# Patient Record
Sex: Female | Born: 1970
Health system: Southern US, Community
[De-identification: ages and names within clinical notes are randomized; demographics above are authoritative.]

## PROBLEM LIST (undated history)

## (undated) DIAGNOSIS — K589 Irritable bowel syndrome without diarrhea: Secondary | ICD-10-CM

## (undated) DIAGNOSIS — E669 Obesity, unspecified: Secondary | ICD-10-CM

## (undated) DIAGNOSIS — E282 Polycystic ovarian syndrome: Secondary | ICD-10-CM

## (undated) DIAGNOSIS — N809 Endometriosis, unspecified: Secondary | ICD-10-CM

## (undated) DIAGNOSIS — K219 Gastro-esophageal reflux disease without esophagitis: Secondary | ICD-10-CM

## (undated) DIAGNOSIS — R51 Headache: Secondary | ICD-10-CM

## (undated) DIAGNOSIS — D509 Iron deficiency anemia, unspecified: Secondary | ICD-10-CM

## (undated) DIAGNOSIS — K5900 Constipation, unspecified: Secondary | ICD-10-CM

## (undated) DIAGNOSIS — R1310 Dysphagia, unspecified: Secondary | ICD-10-CM

## (undated) DIAGNOSIS — E538 Deficiency of other specified B group vitamins: Secondary | ICD-10-CM

## (undated) DIAGNOSIS — G96198 Other disorders of meninges, not elsewhere classified: Secondary | ICD-10-CM

## (undated) DIAGNOSIS — R519 Headache, unspecified: Secondary | ICD-10-CM

## (undated) DIAGNOSIS — F329 Major depressive disorder, single episode, unspecified: Secondary | ICD-10-CM

## (undated) DIAGNOSIS — G4700 Insomnia, unspecified: Secondary | ICD-10-CM

## (undated) DIAGNOSIS — F32A Depression, unspecified: Secondary | ICD-10-CM

## (undated) DIAGNOSIS — G4730 Sleep apnea, unspecified: Secondary | ICD-10-CM

## (undated) DIAGNOSIS — G9619 Other disorders of meninges, not elsewhere classified: Secondary | ICD-10-CM

## (undated) DIAGNOSIS — IMO0002 Reserved for concepts with insufficient information to code with codable children: Secondary | ICD-10-CM

## (undated) DIAGNOSIS — E119 Type 2 diabetes mellitus without complications: Secondary | ICD-10-CM

## (undated) DIAGNOSIS — G8929 Other chronic pain: Secondary | ICD-10-CM

## (undated) DIAGNOSIS — N979 Female infertility, unspecified: Secondary | ICD-10-CM

## (undated) DIAGNOSIS — E88819 Insulin resistance, unspecified: Secondary | ICD-10-CM

## (undated) DIAGNOSIS — R7303 Prediabetes: Secondary | ICD-10-CM

## (undated) DIAGNOSIS — F419 Anxiety disorder, unspecified: Secondary | ICD-10-CM

## (undated) DIAGNOSIS — T7840XA Allergy, unspecified, initial encounter: Secondary | ICD-10-CM

## (undated) DIAGNOSIS — R600 Localized edema: Secondary | ICD-10-CM

## (undated) DIAGNOSIS — M1990 Unspecified osteoarthritis, unspecified site: Secondary | ICD-10-CM

## (undated) DIAGNOSIS — D649 Anemia, unspecified: Secondary | ICD-10-CM

## (undated) HISTORY — PX: SPINAL FUSION: SHX223

## (undated) HISTORY — DX: Other chronic pain: G89.29

## (undated) HISTORY — DX: Irritable bowel syndrome, unspecified: K58.9

## (undated) HISTORY — DX: Female infertility, unspecified: N97.9

## (undated) HISTORY — DX: Gastro-esophageal reflux disease without esophagitis: K21.9

## (undated) HISTORY — DX: Allergy, unspecified, initial encounter: T78.40XA

## (undated) HISTORY — PX: CHOLECYSTECTOMY: SHX55

## (undated) HISTORY — DX: Insulin resistance, unspecified: E88.819

## (undated) HISTORY — DX: Unspecified osteoarthritis, unspecified site: M19.90

## (undated) HISTORY — DX: Major depressive disorder, single episode, unspecified: F32.9

## (undated) HISTORY — DX: Obesity, unspecified: E66.9

## (undated) HISTORY — DX: Prediabetes: R73.03

## (undated) HISTORY — DX: Type 2 diabetes mellitus without complications: E11.9

## (undated) HISTORY — DX: Headache, unspecified: R51.9

## (undated) HISTORY — DX: Insomnia, unspecified: G47.00

## (undated) HISTORY — DX: Other disorders of meninges, not elsewhere classified: G96.19

## (undated) HISTORY — DX: Dysphagia, unspecified: R13.10

## (undated) HISTORY — DX: Endometriosis, unspecified: N80.9

## (undated) HISTORY — DX: Other disorders of meninges, not elsewhere classified: G96.198

## (undated) HISTORY — DX: Deficiency of other specified B group vitamins: E53.8

## (undated) HISTORY — PX: COLONOSCOPY: SHX174

## (undated) HISTORY — PX: BRAIN SURGERY: SHX531

## (undated) HISTORY — DX: Anxiety disorder, unspecified: F41.9

## (undated) HISTORY — PX: ESOPHAGOGASTRODUODENOSCOPY: SHX1529

## (undated) HISTORY — DX: Sleep apnea, unspecified: G47.30

## (undated) HISTORY — DX: Iron deficiency anemia, unspecified: D50.9

## (undated) HISTORY — DX: Headache: R51

## (undated) HISTORY — DX: Localized edema: R60.0

## (undated) HISTORY — DX: Depression, unspecified: F32.A

## (undated) HISTORY — DX: Reserved for concepts with insufficient information to code with codable children: IMO0002

## (undated) HISTORY — DX: Polycystic ovarian syndrome: E28.2

---
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
## 1998-12-14 HISTORY — PX: ERCP: SHX60

## 1999-01-23 ENCOUNTER — Encounter: Payer: Self-pay | Admitting: Obstetrics and Gynecology

## 1999-01-23 ENCOUNTER — Ambulatory Visit (HOSPITAL_COMMUNITY): Admission: RE | Admit: 1999-01-23 | Discharge: 1999-01-23 | Payer: Self-pay | Admitting: Obstetrics and Gynecology

## 1999-04-23 ENCOUNTER — Emergency Department (HOSPITAL_COMMUNITY): Admission: EM | Admit: 1999-04-23 | Discharge: 1999-04-24 | Payer: Self-pay | Admitting: Emergency Medicine

## 1999-06-06 ENCOUNTER — Ambulatory Visit (HOSPITAL_COMMUNITY): Admission: RE | Admit: 1999-06-06 | Discharge: 1999-06-08 | Payer: Self-pay | Admitting: *Deleted

## 1999-06-06 ENCOUNTER — Encounter: Payer: Self-pay | Admitting: *Deleted

## 1999-06-07 ENCOUNTER — Encounter: Payer: Self-pay | Admitting: Internal Medicine

## 1999-09-24 ENCOUNTER — Other Ambulatory Visit: Admission: RE | Admit: 1999-09-24 | Discharge: 1999-09-24 | Payer: Self-pay | Admitting: Obstetrics and Gynecology

## 1999-12-15 HISTORY — PX: ABDOMINAL HYSTERECTOMY: SHX81

## 2000-06-21 ENCOUNTER — Ambulatory Visit (HOSPITAL_COMMUNITY): Admission: RE | Admit: 2000-06-21 | Discharge: 2000-06-21 | Payer: Self-pay | Admitting: Obstetrics and Gynecology

## 2000-06-21 ENCOUNTER — Encounter: Payer: Self-pay | Admitting: Obstetrics and Gynecology

## 2000-08-19 ENCOUNTER — Inpatient Hospital Stay (HOSPITAL_COMMUNITY): Admission: RE | Admit: 2000-08-19 | Discharge: 2000-08-20 | Payer: Self-pay | Admitting: Obstetrics and Gynecology

## 2000-08-19 ENCOUNTER — Encounter (INDEPENDENT_AMBULATORY_CARE_PROVIDER_SITE_OTHER): Payer: Self-pay | Admitting: Specialist

## 2001-12-15 ENCOUNTER — Encounter: Payer: Self-pay | Admitting: Obstetrics and Gynecology

## 2001-12-15 ENCOUNTER — Ambulatory Visit (HOSPITAL_COMMUNITY): Admission: RE | Admit: 2001-12-15 | Discharge: 2001-12-15 | Payer: Self-pay | Admitting: Obstetrics and Gynecology

## 2001-12-20 ENCOUNTER — Encounter: Payer: Self-pay | Admitting: General Surgery

## 2001-12-20 ENCOUNTER — Ambulatory Visit (HOSPITAL_COMMUNITY): Admission: RE | Admit: 2001-12-20 | Discharge: 2001-12-20 | Payer: Self-pay | Admitting: General Surgery

## 2004-08-08 ENCOUNTER — Encounter: Admission: RE | Admit: 2004-08-08 | Discharge: 2004-08-08 | Payer: Self-pay | Admitting: General Surgery

## 2004-08-11 ENCOUNTER — Ambulatory Visit (HOSPITAL_COMMUNITY): Admission: RE | Admit: 2004-08-11 | Discharge: 2004-08-11 | Payer: Self-pay | Admitting: General Surgery

## 2004-08-11 ENCOUNTER — Encounter: Admission: RE | Admit: 2004-08-11 | Discharge: 2004-08-11 | Payer: Self-pay | Admitting: General Surgery

## 2004-10-08 ENCOUNTER — Encounter: Admission: RE | Admit: 2004-10-08 | Discharge: 2004-11-27 | Payer: Self-pay | Admitting: General Surgery

## 2004-10-20 ENCOUNTER — Observation Stay (HOSPITAL_COMMUNITY): Admission: RE | Admit: 2004-10-20 | Discharge: 2004-10-21 | Payer: Self-pay | Admitting: General Surgery

## 2004-12-14 HISTORY — PX: LAPAROSCOPIC GASTRIC BANDING: SHX1100

## 2005-10-19 ENCOUNTER — Ambulatory Visit (HOSPITAL_COMMUNITY): Admission: RE | Admit: 2005-10-19 | Discharge: 2005-10-19 | Payer: Self-pay | Admitting: General Surgery

## 2007-07-22 ENCOUNTER — Ambulatory Visit (HOSPITAL_COMMUNITY): Admission: RE | Admit: 2007-07-22 | Discharge: 2007-07-22 | Payer: Self-pay | Admitting: Surgery

## 2007-08-25 ENCOUNTER — Ambulatory Visit: Payer: Self-pay | Admitting: Internal Medicine

## 2007-08-25 LAB — CONVERTED CEMR LAB
ALT: 28 units/L (ref 0–35)
AST: 24 units/L (ref 0–37)
Albumin: 3.8 g/dL (ref 3.5–5.2)
Alkaline Phosphatase: 117 units/L (ref 39–117)
BUN: 5 mg/dL — ABNORMAL LOW (ref 6–23)
Basophils Absolute: 0 10*3/uL (ref 0.0–0.1)
Basophils Relative: 0.1 % (ref 0.0–1.0)
Bilirubin, Direct: 0.1 mg/dL (ref 0.0–0.3)
CO2: 28 meq/L (ref 19–32)
Calcium: 9.2 mg/dL (ref 8.4–10.5)
Chloride: 105 meq/L (ref 96–112)
Creatinine, Ser: 1.1 mg/dL (ref 0.4–1.2)
Eosinophils Absolute: 0.1 10*3/uL (ref 0.0–0.6)
Eosinophils Relative: 1.9 % (ref 0.0–5.0)
GFR calc Af Amer: 71 mL/min
GFR calc non Af Amer: 59 mL/min
Glucose, Bld: 99 mg/dL (ref 70–99)
HCT: 39.8 % (ref 36.0–46.0)
Hemoglobin: 14.1 g/dL (ref 12.0–15.0)
Lymphocytes Relative: 36.7 % (ref 12.0–46.0)
MCHC: 35.4 g/dL (ref 30.0–36.0)
MCV: 87.8 fL (ref 78.0–100.0)
Monocytes Absolute: 0.7 10*3/uL (ref 0.2–0.7)
Monocytes Relative: 10.6 % (ref 3.0–11.0)
Neutro Abs: 3.2 10*3/uL (ref 1.4–7.7)
Neutrophils Relative %: 50.7 % (ref 43.0–77.0)
Platelets: 277 10*3/uL (ref 150–400)
Potassium: 3.9 meq/L (ref 3.5–5.1)
RBC: 4.53 M/uL (ref 3.87–5.11)
RDW: 12.6 % (ref 11.5–14.6)
Sodium: 139 meq/L (ref 135–145)
TSH: 1.61 microintl units/mL (ref 0.35–5.50)
Total Bilirubin: 0.5 mg/dL (ref 0.3–1.2)
Total Protein: 7 g/dL (ref 6.0–8.3)
WBC: 6.3 10*3/uL (ref 4.5–10.5)

## 2007-09-27 ENCOUNTER — Ambulatory Visit: Payer: Self-pay | Admitting: Internal Medicine

## 2008-02-10 DIAGNOSIS — E669 Obesity, unspecified: Secondary | ICD-10-CM | POA: Insufficient documentation

## 2008-02-10 DIAGNOSIS — F329 Major depressive disorder, single episode, unspecified: Secondary | ICD-10-CM | POA: Insufficient documentation

## 2008-02-10 DIAGNOSIS — K589 Irritable bowel syndrome without diarrhea: Secondary | ICD-10-CM | POA: Insufficient documentation

## 2008-02-10 DIAGNOSIS — K5909 Other constipation: Secondary | ICD-10-CM | POA: Insufficient documentation

## 2008-02-10 DIAGNOSIS — N809 Endometriosis, unspecified: Secondary | ICD-10-CM | POA: Insufficient documentation

## 2008-11-09 ENCOUNTER — Encounter: Payer: Self-pay | Admitting: Internal Medicine

## 2009-04-16 LAB — TSH: TSH: 1.8 u[IU]/mL (ref <=5.90)

## 2009-04-16 LAB — LIPID PANEL
Cholesterol: 137 mg/dL (ref 0–200)
HDL: 39 mg/dL (ref 35–70)
LDL Cholesterol: 74 mg/dL
Triglycerides: 121 mg/dL (ref 40–160)

## 2010-10-11 LAB — HEPATIC FUNCTION PANEL: Alkaline Phosphatase: 121 U/L (ref 25–125)

## 2010-10-11 LAB — TSH: TSH: 1.54 u[IU]/mL (ref <=5.90)

## 2011-01-03 ENCOUNTER — Encounter: Payer: Self-pay | Admitting: General Surgery

## 2011-01-13 NOTE — Procedures (Signed)
Summary: [facility]  [facility]   Imported By: Donneta Romberg 02/10/2008 16:46:01  _____________________________________________________________________  External Attachment:    Type:   Image     Comment:   External Document

## 2011-01-13 NOTE — Letter (Signed)
Summary: Restarted Lap-band/[facility]  Restarted Lap-band/[facility]   Imported By: Lester [place]insburg 12/22/2008 10:15:59  _____________________________________________________________________  External Attachment:    Type:   Image     Comment:   External Document

## 2011-01-14 LAB — HEMOGLOBIN A1C: Hgb A1c MFr Bld: 4.9 % (ref 4.0–6.0)

## 2011-02-04 ENCOUNTER — Other Ambulatory Visit: Payer: Self-pay | Admitting: Neurology

## 2011-02-04 DIAGNOSIS — Q054 Unspecified spina bifida with hydrocephalus: Secondary | ICD-10-CM

## 2011-02-04 DIAGNOSIS — G950 Syringomyelia and syringobulbia: Secondary | ICD-10-CM

## 2011-02-09 ENCOUNTER — Ambulatory Visit
Admission: RE | Admit: 2011-02-09 | Discharge: 2011-02-09 | Disposition: A | Discharge status: Home / Self Care | Payer: 59 | Source: Ambulatory Visit | Attending: Neurology | Admitting: Neurology

## 2011-02-09 DIAGNOSIS — G950 Syringomyelia and syringobulbia: Secondary | ICD-10-CM

## 2011-02-09 DIAGNOSIS — Q054 Unspecified spina bifida with hydrocephalus: Secondary | ICD-10-CM

## 2011-02-20 ENCOUNTER — Other Ambulatory Visit (HOSPITAL_COMMUNITY): Payer: Self-pay | Admitting: Neurosurgery

## 2011-02-20 DIAGNOSIS — G950 Syringomyelia and syringobulbia: Secondary | ICD-10-CM

## 2011-02-20 DIAGNOSIS — G930 Cerebral cysts: Secondary | ICD-10-CM

## 2011-02-20 DIAGNOSIS — M542 Cervicalgia: Secondary | ICD-10-CM

## 2011-02-24 ENCOUNTER — Other Ambulatory Visit (HOSPITAL_COMMUNITY): Payer: Self-pay | Admitting: Neurosurgery

## 2011-02-24 ENCOUNTER — Ambulatory Visit (HOSPITAL_COMMUNITY)
Admission: RE | Admit: 2011-02-24 | Discharge: 2011-02-24 | Disposition: A | Discharge status: Home / Self Care | Payer: 59 | Source: Ambulatory Visit | Attending: Neurosurgery | Admitting: Neurosurgery

## 2011-02-24 DIAGNOSIS — G950 Syringomyelia and syringobulbia: Secondary | ICD-10-CM

## 2011-02-24 DIAGNOSIS — M4802 Spinal stenosis, cervical region: Secondary | ICD-10-CM | POA: Insufficient documentation

## 2011-02-24 DIAGNOSIS — G935 Compression of brain: Secondary | ICD-10-CM | POA: Insufficient documentation

## 2011-02-24 DIAGNOSIS — M542 Cervicalgia: Secondary | ICD-10-CM

## 2011-02-24 DIAGNOSIS — G930 Cerebral cysts: Secondary | ICD-10-CM

## 2011-02-24 DIAGNOSIS — Q7649 Other congenital malformations of spine, not associated with scoliosis: Secondary | ICD-10-CM | POA: Insufficient documentation

## 2011-03-31 ENCOUNTER — Encounter (HOSPITAL_COMMUNITY)
Admission: RE | Admit: 2011-03-31 | Discharge: 2011-03-31 | Disposition: A | Discharge status: Home / Self Care | Payer: 59 | Source: Ambulatory Visit | Attending: Neurosurgery | Admitting: Neurosurgery

## 2011-03-31 DIAGNOSIS — Z01812 Encounter for preprocedural laboratory examination: Secondary | ICD-10-CM | POA: Insufficient documentation

## 2011-03-31 DIAGNOSIS — Z01818 Encounter for other preprocedural examination: Secondary | ICD-10-CM | POA: Insufficient documentation

## 2011-03-31 LAB — BASIC METABOLIC PANEL
BUN: 10 mg/dL (ref 6–23)
CO2: 29 mEq/L (ref 19–32)
Calcium: 9.2 mg/dL (ref 8.4–10.5)
Chloride: 103 mEq/L (ref 96–112)
Creatinine, Ser: 1.14 mg/dL (ref 0.4–1.2)
GFR calc Af Amer: >60 mL/min (ref >60)
GFR calc non Af Amer: 52 mL/min — ABNORMAL LOW (ref >60)
Glucose, Bld: 80 mg/dL (ref 70–99)
Potassium: 4.5 mEq/L (ref 3.5–5.1)
Sodium: 139 mEq/L (ref 135–145)

## 2011-03-31 LAB — CBC
HCT: 42.6 % (ref 36.0–46.0)
Hemoglobin: 14.3 g/dL (ref 12.0–15.0)
MCH: 29.6 pg (ref 26.0–34.0)
MCHC: 33.6 g/dL (ref 30.0–36.0)
MCV: 88.2 fL (ref 78.0–100.0)
Platelets: 277 10*3/uL (ref 150–400)
RBC: 4.83 MIL/uL (ref 3.87–5.11)
RDW: 13.2 % (ref 11.5–15.5)
WBC: 7.1 10*3/uL (ref 4.0–10.5)

## 2011-03-31 LAB — SURGICAL PCR SCREEN
MRSA, PCR: NEGATIVE
Staphylococcus aureus: NEGATIVE

## 2011-04-06 ENCOUNTER — Inpatient Hospital Stay (HOSPITAL_COMMUNITY): Payer: 59

## 2011-04-06 ENCOUNTER — Inpatient Hospital Stay (HOSPITAL_COMMUNITY)
Admission: RE | Admit: 2011-04-06 | Discharge: 2011-04-09 | DRG: 025 | Disposition: A | Discharge status: Home / Self Care | Payer: 59 | Source: Ambulatory Visit | Attending: Neurosurgery | Admitting: Neurosurgery

## 2011-04-06 DIAGNOSIS — Q761 Klippel-Feil syndrome: Secondary | ICD-10-CM

## 2011-04-06 DIAGNOSIS — E669 Obesity, unspecified: Secondary | ICD-10-CM | POA: Diagnosis present

## 2011-04-06 DIAGNOSIS — F3289 Other specified depressive episodes: Secondary | ICD-10-CM | POA: Diagnosis present

## 2011-04-06 DIAGNOSIS — Q7649 Other congenital malformations of spine, not associated with scoliosis: Secondary | ICD-10-CM

## 2011-04-06 DIAGNOSIS — G935 Compression of brain: Secondary | ICD-10-CM | POA: Diagnosis present

## 2011-04-06 DIAGNOSIS — M5030 Other cervical disc degeneration, unspecified cervical region: Secondary | ICD-10-CM | POA: Diagnosis present

## 2011-04-06 DIAGNOSIS — Z6841 Body Mass Index (BMI) 40.0 and over, adult: Secondary | ICD-10-CM

## 2011-04-06 DIAGNOSIS — M47812 Spondylosis without myelopathy or radiculopathy, cervical region: Secondary | ICD-10-CM | POA: Diagnosis present

## 2011-04-06 DIAGNOSIS — G950 Syringomyelia and syringobulbia: Principal | ICD-10-CM | POA: Diagnosis present

## 2011-04-06 DIAGNOSIS — F329 Major depressive disorder, single episode, unspecified: Secondary | ICD-10-CM | POA: Diagnosis present

## 2011-04-06 DIAGNOSIS — Z01812 Encounter for preprocedural laboratory examination: Secondary | ICD-10-CM

## 2011-04-06 DIAGNOSIS — Z79899 Other long term (current) drug therapy: Secondary | ICD-10-CM

## 2011-04-06 LAB — TYPE AND SCREEN
ABO/RH(D): O POS
Antibody Screen: NEGATIVE

## 2011-04-06 LAB — ABO/RH: ABO/RH(D): O POS

## 2011-04-08 NOTE — Op Note (Signed)
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  POSTOPERATIVE DIAGNOSES:  Chiari malformation, syringomyelia (holocord), congenital fusion of the C1 and occiput, congenital fusion of C2 and C3 consistent with a Klippel-Feil syndrome, advanced cervical spondylosis and degenerative disk disease at the C3-4 and C4-5 levels with resulting canal stenosis. PROCEDURES:  Chiari decompression including suboccipital craniectomy and upper cervical laminectomy including the diffused C2 and C3 spinous process and lamina as well as the spinous process lamina of C4 in the upper portion of the spinous process lamina of C5 with decompression of the craniocervical junction and cervical canal duraplasty with dura guard and C3-C5 posterior cervical arthrodesis with lateral massive screws ([place]) and locally harvested morselized autograft with the operating microscope and microsurgical microdissection technique.  SURGEON:  Erin Shorts, MD  ASSISTANT:  Erin Fake, MD  ANESTHESIA:  General endotracheal.  INDICATIONS:  The patient is a 40 year old woman who presented with variety of symptoms including right facial and upper extremity pain, numbness, history of headaches and altered vision.  Workup included initially MRI scan that revealed Chiari malformation with significant impaction of the  cerebellar tonsils into the upper cervical canal with syringomyelia extending from C2 down to the upper thoracic spinal cord. Subsequent MRI scan showed holocord syrinx.  CT scan was done of the craniocervical junction which revealed extensive congenital anomalies including congenital fusion of C1 and the occiput, congenital fusion of C2 and C3.  Decision was made to proceed with decompression of the Chiari malformation and decompression of the craniocervical junction at the upper cervical spinal canal to the level of C5 so as decompressed to C3-4, C4-5 levels as well.  Decision was made to proceed with posterolateral arthrodesis at those levels.  PROCEDURE:  The patient was brought to the operating room and placed under general endotracheal anesthesia.  The patient was placed on 3-pin [place], head holder was applied to the patient and the patient was turned to a prone position and then we checked positioning with the C- arm fluoroscope and then the occipital scalp was shaved and the occipital scalp and posterior neck and upper back were prepped with Betadine soap solution and draped in sterile fashion.  The midline was infiltrated with local anesthetic with epinephrine and then a midline incision made over the occipital and upper cervical region.  Dissection was carried down through the subcutaneous tissue.  Bipolar cautery and electrocautery was used to maintain hemostasis.  Dissection was carried down to the occiput as well as to the posterior spinous processes.  Self- retaining retractors were  placed and then we opened the cervical fascia and dissected the paracervical musculature from the spinous process and lamina.  The C-arm fluoroscope was again brought in to help [place] with our counts and we were able to identify the occiput, the fused posterior elements of C2 and C3 and the spinous process and lamina of C4 and C5. Self-retaining retractors were adjusted and the dissection was  carried out laterally exposing the facet complexes bilaterally at the C3-4 and C4-5.  At C3-4, there appeared to be partial fusion already present.  We then went ahead and placed lateral mass screws bilaterally at C3-C4 and C5 using C-arm fluoroscopic guidance and direct visualization.  This was done before we proceeded with a decompression.  Entry points were identified on the posterior aspects of the lateral masses C3-C4 and C5 bilaterally.  Pilot hole was started at each level and then the lateral mass was drilled to a depth of 12-14 mm.  I examined with the ball probe at each site.  Good bony surface were found.  The posterior cortical surface was then tapped and then we placed 3.5-mm screws bilaterally at each level using 12-mm screws bilaterally at C3 and C5 and 10-mm screws bilaterally at C4.  Once the screws were in place, the C-arm fluoroscope was removed and we proceeded with a decompression.  After the decompression and duraplasty was completed, we completed the posterior instrumentation construct and posterior fusion.  The decompression was begun with the suboccipital craniectomy using the high-speed drill and Kerrison punches.  The craniectomy was performed bilaterally and we opened the posterior ring of the foramen magnum fully.  We then proceeded with laminectomy of the upper cervical region, removing the fused spinous process C2 and C3 along with the lamina as well as the spinous process C4 and the lamina.  We subsequently removed the upper portion of the lamina of C5.  Once the bony decompression was completed, the operating microscope was draped and brought into the field.  We then carefully opened the dura inferiorly in the midline and carried the opening rostrally.  We identified the impacted portion of the cerebellar tonsils.  The dura opening at the craniocervical junction opened in a Y-shaped fashion extending superolaterally to the left and to the right.  The  arachnoid had several bands constricting the cerebellar tonsils was opened and the tonsils are actually retracted somewhat rostrally.  Good pulsations were noted and small bleeding points in the dural edges were coagulated with bipolar cautery.  We then cut a piece of dura guard to match the dural opening and a dural patch was made in the triangular fashion.  It was secured in place with interrupted and running 6-0 Prolene suture, first securing each of the 3 corners and then running in line of suture along each limb.  A good dural closure was achieved and tested against a Valsalva maneuver. Small piece of dura foam was placed over a small dural opening of the cerebellar hemisphere and dura seal was injected over the dural closure.  We then went ahead and cut rods to fit within the screw heads.  They were bent with a [place] bender and then placed within the screw heads and secured with locking nuts which were tightened against counter torque with a torque wrench.  Once all 6 locking nuts were in place.  We then packed locally harvested morselized autograft in the lateral gutter over the lateral masses bilaterally from C3-C5 taking care to ensure that none of the bone  graft fell into the open spinal canal.  Once the fusion construct was completed, we began to close paracervical musculature was approximated with interrupted undyed one Vicryl sutures. The deep fascia was closed with interrupted undyed #1 Vicryl sutures. Scarpa fascia was closed with interrupted undyed one Vicryl suture and the subcutaneous and subcuticular closed with interrupted inverted 2-0 Vicryl sutures.  Skin was closed with surgical staples and wound was dressed with Adaptic and sterile gauze.  The procedure was tolerated well.  The estimated blood loss was 100 mL.  Sponge and needle counts were correct.  Following surgery, the patient was returned back to the supine position.  The three pin Mayfield head holder was  removed.  The patient was reversed from the anesthetic, extubated and transferred to the recovery room.  Further care was noted to be removing all 4 extremities to command.     Erin Jimenez, M.D.     RWN/MEDQ  D:  04/06/2011  T:  04/07/2011  Job:  [id number]  Electronically Signed by Shirlean Kelly M.D. on 04/08/2011 12:51:30 PM

## 2011-04-11 NOTE — Discharge Summary (Signed)
  Erin Jimenez, Erin Jimenez                  ACCOUNT NO.:  [id number]  MEDICAL RECORD NO.:  [id number]           PATIENT TYPE:  I  LOCATION:  3105                         FACILITY:  [facility]  PHYSICIAN:  Hewitt Shorts, M.D.DATE OF BIRTH:  [birth year]  DATE OF ADMISSION:  04/06/2011 DATE OF DISCHARGE:  04/09/2011                              DISCHARGE SUMMARY   The patient is a 40 year old woman who presented with numbness in the right side of her face, extending into the neck and right shoulder, some alteration of vision.  She noted sneezing, coughing, and straining.  She developed sharp pain at the craniocervical junction.  Yesterday she underwent a workup that revealed significant Chiari malformation with significant impaction of the cerebellar tonsils into the upper cervical spinal canal as well as significant anomalies of the transcervical junction and upper cervical spine.  She had a hollow cord syringomyelia. She had assimilation of C1 into the occiput and a clipofile involving the C2 and C3 vertebra.  Significant stenosis of the cervical canal as well at the C3-4, C4-5 levels, and decision made to proceed with decompression of the Chiari malformation, upper cervical laminectomy, duraplasty, and posterior cervical arthrodesis from C3-C5, and the patient is admitted for such.  General examination showed a BMI of 44, but was otherwise unremarkable.  Neurologic examination showed intact cranial nerves other than diminished sensation on the right side of the face compared to the left side of the face.  Motor exam showed 5/5 strength.  Sensation was intact.  HOSPITAL COURSE:  The patient was admitted, underwent Chiari decompression including suboccipital craniectomy, upper cervical laminectomy, and duraplasty at C3-C5 and posterior cervical arthrodesis with lateral mass screws and bone graft.  She has done very nicely following surgery, fairly comfortable, we are able to the progress  her to clear liquids and then to a regular diet.  We will progress her from bed rest out of bed to chair to ambulation in the ICU.  She has been afebrile, her wound is healing nicely.  She is voiding well and symptomatically she is more comfortable than prior to surgery.  Or removing half of her staples prior to discharge, she and her family were given supplies for dressing changes daily for next few days.  She is given instructions on wound care and activity.  She is to be walking outside the fresh air, not to drive for at least the next 2 to 3 weeks, and not to lift anything heavy or strenuous.  She is to return in 4-5 days for removal of the remainder of her staples and she is to contact us if she needs anything else in the meantime.  Discharge prescription was given for Norco 5/225 one to two q.4-6 h. p.r.n. pain, 60 tablets, no refills.  DISCHARGE DIAGNOSES: 1. Chiari malformation. 2. Syringomyelia. 3. Clipofile syndrome. 4. Cervical spondylosis, degenerative disk disease, and stenosis.     Hewitt Shorts, M.D.     RWN/MEDQ  D:  04/09/2011  T:  04/10/2011  Job:  [id number]  Electronically Signed by Shirlean Kelly M.D. on 04/11/2011 08:07:01 AM

## 2011-04-11 NOTE — H&P (Signed)
NAME:  Erin Jimenez, Erin Jimenez                  ACCOUNT NO.:  [id number]  MEDICAL RECORD NO.:  [id number]           PATIENT TYPE:  I  LOCATION:  3105                         FACILITY:  [facility]  PHYSICIAN:  Hewitt Shorts, M.D.DATE OF BIRTH:  1971/03/15  DATE OF ADMISSION:  04/06/2011 DATE OF DISCHARGE:  04/09/2011                             HISTORY & PHYSICAL   HISTORY OF PRESENT ILLNESS:  The patient is a 40 year old right-handed white female who is evaluated for Chiari malformation.  She had noticed symptoms a few months ago, but now that she knows the diagnosis which she reflects back and notes that she has had some symptoms for many years.  Three to four months ago, she noted a knot at the base of her neck.  She was seen at an urgent care and treated with Mobic and Flexeril, subsequently developed numbness in the right side of her face, underwent neurology evaluation.  The numbness not only involved her right side of her face but also extended to the right of her neck towards the right shoulder, some pain extending from the right side of the neck down towards the right trapezius.  She also has noted some alteration of her vision, but denies diplopia, and this seems to her more like blurred vision.  She denies dysphagia, but says that sometimes the right side of her throat can have some numbness and she can have some imbalance at times.  She notes that for many years with sneezing, coughing, and straining she will develop sharp pain in the craniocervical junction posteriorly.  She gets some pain in the right frontal region.  She knows that for as long as she can remember with laughing she will get pain in the craniocervical junction along with some dizziness and she has to sit or lie down to left it subside and then to be able to go on.  The patient was evaluated with an MRI scan of the cervical spine and thoracic spine.  These revealed an anomaly of the craniocervical junction and the  C2-3 vertebra.  There was profound Chiari malformation with significant impaction of the cerebellar tonsils to the C3 level. There was syringomyelia extending from C2 into the thoracic spinal cord and underlying degenerative disk disease and spondylosis particularly at the C3-4 and C4-5 levels.  The syringomyelia extends down to lower thoracic spinal cord.  A CT scan of the craniocervical junction and cervical spine was also obtained.  This revealed assimilation of C1 into the occiput as well as C2-3 Klippel-Feil with advanced degenerative changes at C3-4 and C4-5 levels with canal stenosis, a large spur on the right at C3-4, and bilateral spurring at C4-5.  The patient was admitted now for decompression including suboccipital craniectomy, upper cervical laminectomy, duraplasty, and C3-C5 posterior cervical arthrodesis with lateral mass screws and bone graft.  PAST MEDICAL HISTORY:  She does not describe any history of hypertension, myocardial infarction, cancer, stroke, diabetes, peptic ulcer disease, or lung disease.  PREVIOUS SURGICAL HISTORY:  Cholecystectomy in 2000, hysterectomy in 2001, lap band in 2006 which failed and is no longer used.  She has  intolerance to a number of medications which cause nausea including Cipro, doxycycline, oxycodone, and sulfa, but no actual allergies to medications.  CURRENT MEDICATIONS:  Mobic, Flexeril, Wellbutrin, Estratest, Lorcet, Ambien, Claritin, and vitamin B12 injections.  FAMILY HISTORY:  Mother's age 68 with diabetes.  Father's age 86 with non-Hodgkin lymphoma.  Niece was diagnosed with Chiari malformation a year or two ago, but has not required surgical decompression and has presented slowly with headaches.  SOCIAL HISTORY:  The patient works as a Community education officer.  She is married.  She does not smoke.  She drinks alcohol beverages socially.  Denies history of substance abuse.  REVIEW OF SYSTEMS:  Notable  for those as described in the history of present illness and past medical history, but is otherwise unremarkable.  PHYSICAL EXAMINATION:  GENERAL:  The patient is a well-developed, well- nourished, but morbidly obese white female in no acute distress. VITAL SIGNS:  Temperature is 97.2, pulse 83, blood pressure 131/85, respiratory rate 20, height 5 feet and 10 inches, weight 285 pounds, BMI 44. LUNGS:  Clear to auscultation.  She has symmetric respiratory excursion. HEART:  Regular rate and rhythm.  Normal S1 and S2.  There is no murmur. ABDOMEN:  Soft, nontender, and nondistended.  Bowel sounds present. EXTREMITIES:  No clubbing, cyanosis, or edema. MUSCULOSKELETAL:  Some tenderness to palpation around the base of her neck.  She has good range of motion of the neck with flexion, extension, and lateral flexion to either side, but discomfort with extension of the neck and somewhat with lateral flexion to either side. NEUROLOGIC:  Mental status, the patient is awake, alert, and fully oriented.  Her speech is fluent.  She has good comprehension.  Cranial nerves show pupils are equal, round, and reactive to light.  Extraocular movements are intact.  Facial sensation is diminished on the right side as compared to left side.  Facial movement is symmetrical and hearing is present bilaterally.  Palatal movement is symmetrical.  Shoulder shrug is symmetrical.  Tongue is midline.  Motor examination shows 5/5 strength in the upper and lower extremities with the deltoid, biceps, triceps, extrinsic, grip, iliopsoas, quadriceps, dorsiflexor, extensor hallucis longus, and plantarflexor bilaterally.  Sensation is intact to pinprick through the digits of the upper and lower extremities. Reflexes on the left in biceps, brachioradialis, and triceps are 2; right biceps, brachioradialis, and triceps are 1+; quadriceps and gastrocnemius are 2 bilaterally.  Toes are downgoing bilaterally.  She has a normal  gait and stance, but mild to moderate unsteadiness with tandem gait.  IMPRESSION:  The patient with significant type 1 Chiari malformation with pronounced syringomyelia secondary to the Chiari malformation as well as anomalies of the craniocervical junction and of the upper cervical vertebra as described above with facial numbness extending down the right upper extremity, numbness in the right side of her throat, alternation of vision, imbalance, pain, and headache.  PLAN:  The patient will be admitted for suboccipital craniectomy, upper cervical laminectomy, duraplasty, decompression of cervical stenosis, and cervical stabilization with arthrodesis.  I reviewed the patient's condition, her imaging studies, and our plans for surgery with her at length.  We discussed typical length of surgery, hospital stay, overall recuperation, limitations postoperatively, and risks including the risk of infection, bleeding, possibly need for transfusion, risks of neurologic dysfunction including paralysis, coma, death, risk of failure for arthrodesis, possibility of the syringes will not decompress, and anesthesia risks, myocardial function, stroke, pneumonia, and death.  Understanding all of this,  she would like to go ahead with surgery and is admitted for such.     Hewitt Shorts, M.D.     RWN/MEDQ  D:  04/09/2011  T:  04/10/2011  Job:  [id number]  Electronically Signed by Shirlean Kelly M.D. on 04/11/2011 08:07:03 AM

## 2011-04-28 NOTE — Assessment & Plan Note (Signed)
[facility]                         GASTROENTEROLOGY OFFICE NOTE   Erin Jimenez, Erin Jimenez                         MRN:          [id number]  DATE:08/25/2007                            DOB:          1971-06-16    REFERRING PHYSICIAN:  self   CHIEF COMPLAINT:  Constipation.   PRIMARY CARE PHYSICIAN:  Reuben Likes, M.D.   SURGEON:  Sandria Bales. Ezzard Standing, M.D.   HISTORY:  This is a 40 year old white woman who has suffered from  chronic constipation.  The constipation has gotten much worse.  She  seems to think it got worse when she had laparoscopic banding for  obesity in 2005 and went on a high protein diet.  She actually had some  hemorrhoid problems then, I think.  It may have gotten a little bit  better over time but then in the past year or so, it has gotten  progressively worse to the point where she was actually having nausea  and vomiting when she was severely constipated.  She had an EGD  performed by Dr. Ezzard Standing on July 22, 2007, with minimal irritation below  the band on endoscopy but otherwise negative.  Subsequently, she has had  the saline taken out of her bands and that probably has not helped her  constipation.  She is not describing bleeding.  She has tried Clinical biochemist,  [employer] and that has not helped, taking up to 3-4 doses of MiraLax a  day and over a 2-week period.  She has had Dulcolax cause results with  good bowel movements but she has to take about 3 of them.  The nausea  and vomiting is much better.  She says she is lucky if she moves her  bowels twice a week at this point.   MEDICATIONS:  1. Bupropion 150 mg t.i.d.  2. Ambien 5 mg q.h.s.  3. EEMTDS 1.25/2.5 mg  daily (hormone supplement).  4. B12 monthly.  5. Claritin OTC daily.   PAST MEDICAL HISTORY:   ALLERGIES:  1. CIPRO.  2. OXYCODONE.  3. DOXYCYCLINE which all cause vomiting.   ILLNESS/SURGERY:  1. Depression.  2. Obesity.  3. Allergic rhinosinusitis.  4. Prior  laparoscopic-assisted vaginal hysterectomy and with right      salpingo-oophorectomy and cystoscopy and then subsequently she had      a left oophorectomy.  5. Prior cholecystectomy, 2000.  6. Prior ERCP with biliary sphincterotomy and common duct stone      extraction, 2000 (Dr. Marina Goodell).  7. Endometriosis.   FAMILY HISTORY:  Positive for diabetes in her mother.   SOCIAL HISTORY:  She is married.  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  Occasional alcohol.  No tobacco or drugs.  No  children.   REVIEW OF SYSTEMS:  As in my form, otherwise, negative.   PHYSICAL EXAMINATION:   PHYSICAL EXAMINATION:  VITAL SIGNS:  Height 5 feet 7 inches, weight 277  pounds, blood pressure 118/78, pulse 80.  GENERAL:  She is obese.  EYES:  Anicteric.  ENT:  Normal mouth and pharynx.  NECK:  Supple.  No mass.  CHEST:  Clear.  HEART:  S1 S2.  ABDOMEN:  She has laparoscopic scars but is otherwise benign without  organomegaly or mass.  RECTAL:  Normal.  No abnormal descent.  No mass.  No significant stool  in the vault.  There is no rectocele.  Normal tone.  Note, rectal exam  was performed in the presence of female medical staff.  LYMPHATIC:  No neck or inguinal adenopathy.  EXTREMITIES:  Free of edema in the lower extremities.  SKIN:  No acute rash.  PSYCH:  She is alert and oriented x3.   ASSESSMENT:  Chronic constipation, perhaps somewhat worse.  It sounds  like a transit problem more than anything.   PLAN:  1. CBC with diff, C-MET, TSH.  Question if she is hypothyroid.  2. Further plans pending that.  A colonoscopy could be indicated.  For      the time being we are going to try Amitiza 24 mcg b.i.d.  She was      warned about the possibility of nausea and to try to fight through      that for 2 weeks before she determines this to be a constant      problem.  Additionally, if she cannot move her bowels with that,      she will add MiraLax once or twice a      day.   Prior to starting all of this she is to purge there is      MiraLax and Dulcolax taking 4 Dulcolax and then 8 glasses of      MiraLax over a 2-3 hour period mixed with Gatorade.     Iva Boop, MD,FACG  Electronically Signed    CEG/MedQ  DD: 08/25/2007  DT: 08/25/2007  Job #: [id number]   cc:   Sandria Bales. Ezzard Standing, M.D.  Zenaida Niece, M.D.  Reuben Likes, M.D.

## 2011-04-28 NOTE — Assessment & Plan Note (Signed)
[facility]                         GASTROENTEROLOGY OFFICE NOTE   Erin Jimenez, Erin Jimenez                         MRN:          [id number]  DATE:09/27/2007                            DOB:          1971/06/27    CHIEF COMPLAINT:  Followup of constipation.   The Amitiza at 24 mcg is working beautifully with bowel movements every  other day.  There has been some occasional MiraLax usage.  She is having  some nausea the past few weeks.  She did not have that initially.  Maybe  1 or 2 weeks at most actually.  She had some phlegm in the back of her  throat, and she seems to be swallowing that and regurgitating that a  little bit.   MEDICATIONS:  Otherwise, listed and reviewed.   PAST MEDICAL HISTORY:  Reviewed and unchanged.  She wants to have her  lap band reinjected.  [name] is actually her bariatric surgeon.   Height 5 feet 7 inches, weight 281 pounds, pulse 72, blood pressure  118/62.   ASSESSMENT:  Constipation predominant irritable bowel syndrome.  I am  not sure what effect if any her lap banding is having.   PLAN:  1. Continue Amitiza 24 mcg b.i.d.  A 65-month prescription with 3      refills will be prescribed to the patient so that she can save      money through the mail order program.  2. I would ride out this nausea.  If she still has this phlegm in the      back of her throat (I do not think this is reflux at this time), I      have asked her to see her primary care physician.  We could add a      proton pump inhibitor.  I did not do that today.  It does not seem      to be the Amitiza given that it started well into the use of that,      though we will keep that in mind.  She will see me back as needed      at this time.   Note, CBC, CMET, TSH were all normal last month.     Iva Boop, MD,FACG  Electronically Signed    CEG/MedQ  DD: 09/27/2007  DT: 09/27/2007  Job #: [id number]   cc:   Reuben Likes, M.D.  Lorne Skeens.  Hoxworth, M.D.  Zenaida Niece, M.D.

## 2011-04-28 NOTE — Op Note (Signed)
NAME:  Erin Jimenez, Erin Jimenez                  ACCOUNT NO.:  [id number]   MEDICAL RECORD NO.:  [id number]          PATIENT TYPE:  AMB   LOCATION:  ENDO                         FACILITY:  [facility]   PHYSICIAN:  Sandria Bales. Ezzard Standing, M.D.  DATE OF BIRTH:  March 22, 1971   DATE OF PROCEDURE:  07/22/2007  DATE OF DISCHARGE:                               OPERATIVE REPORT   PREOPERATIVE DIAGNOSES:  Failed weight-loss, post-band, with recurrent  nausea.   POSTOPERATIVE DIAGNOSES:  Minimal irritation below band on endoscopy.  Otherwise normal endoscopy and normal band placement.   PROCEDURE:  Esophagogastroduodenoscopy.   SURGEON:  Sandria Bales. Ezzard Standing, M.D.   FIRST ASSISTANT:  None.   ANESTHESIA:  Fentanyl 50 micrograms, Versed 4 mg.   COMPLICATIONS:  None.   INDICATIONS FOR PROCEDURE:  Erin Jimenez is a 40 year old white female, who  underwent a laparoscopic band for morbid obesity on October 20, 2004, by  Erin Jimenez.  Her preoperative weight was 294 with a BMI of 47.3.  She lost down as low as 236, but had persistent vomiting, irritation, so  in February of 2008, Erin Jimenez removed all fluid from the band.  She  had an upper GI, which was actually about ten months ago, showing good  band placement.  She now comes for attempted upper endoscopy to document  band placement, evidence of any erosion or ulceration.   The indications and complications of the procedure were explained to the  patient, principle complications being perforation of the bowel.   PROCEDURE NOTE:  Patient had the back of her throat anesthetized with  Cetacaine.  She was rolled to her left side.  She was monitored with  pulse oximetry, blood pressure cuff and EKG.  She was given a total of  50 micrograms of fentanyl, 4 mg of Versed.  A Pentax upper endoscope was  passed without difficulty down the back of her throat, into her stomach  pouch.   I was able to cannulate the duodenum without difficulty.  She had no  evidence of any  duodenal irritation, scar or ulceration.  Her gastric  pouch, though somewhat bland appearing, was really unremarkable.  I did  do biopsies for CLO or for checking H. pylori.   I then retroflexed the scope and could see the band indention on the  proximal stomach from below.  It appeared to be all appropriate with no  evidence of ulceration or poor placement.  She did have a little bit of  irritation under one part surface of the band.  I did take photos of  this.   I went through the scope into the band.  The band orifice was between 39  and 40 cm.  The esophagogastric junction was at about 37 cm for an  approximately 3 cm pouch.  Her Z line, she really had no distal  esophageal irritation or erosion, and the scope was then withdrawn  without difficulty.   Other than some very mild irritation on the underneath surface of the  band, really the band looking in good placement, the pouch seemed to  be  in good size, and she had no evidence of chronic esophagitis.  Photos  were taken and placed in the chart.   She will be making an appointment with Erin Jimenez for followup  evaluation.      Sandria Bales. Ezzard Standing, M.D.  Electronically Signed     DHN/MEDQ  D:  07/22/2007  T:  07/22/2007  Job:  [id number]   cc:   Erin Jimenez, M.D.  [address]  [place]  [place] [address]   Erin Jimenez, M.D.  Fax: [phone]

## 2011-05-01 NOTE — Op Note (Signed)
NAME:  BRISEYDA, FEHR                  ACCOUNT NO.:  [id number]   MEDICAL RECORD NO.:  [id number]          PATIENT TYPE:  AMB   LOCATION:  DAY                          FACILITY:  [facility]   PHYSICIAN:  Sharlet Salina T. Hoxworth, M.D.DATE OF BIRTH:  1970-12-28   DATE OF PROCEDURE:  10/20/2004  DATE OF DISCHARGE:                                 OPERATIVE REPORT   PREOPERATIVE DIAGNOSIS:  Morbid obesity.  Adult onset diabetes mellitus.   POSTOPERATIVE DIAGNOSIS:  Morbid obesity.  Adult onset diabetes mellitus.   OPERATION PERFORMED:  Placement of laparoscopic adjustable gastric band.   SURGEON:  Lorne Skeens. Hoxworth, M.D.   ASSISTANT:  Thornton Park. Daphine Deutscher, MD   ANESTHESIA:  General.   INDICATIONS FOR PROCEDURE:  The patient is a 40 year old white female with  progressive morbid obesity unresponsive to medical management.  She has  developed comorbidities including hyperglycemia.  Elevated lipids and  chronic joint pain.  After extensive discussion of options and preoperative  work-up detailed elsewhere, she was brought to the operating room for  placement of laparoscopic adjustable gastric band.   DESCRIPTION OF PROCEDURE:  The patient was brought to the operating room and  placed in supine on the operating table and general endotracheal anesthesia  was induced.  She was given preoperative antibiotics and subcutaneous  heparin.  PAS were in place.  The abdomen was widely sterilely prepped and  draped.  Access was obtained with 11 mm OptiView trocar in the left  subcostal space.  Pneumoperitoneum was established.  There were a few  adhesions from her previous hysterectomy that were left undisturbed.  Under  direct vision, an 11 mm trocar was placed through the falciform ligament.  A  12 mm trocar in the right midabdomen and an 11 mm trocar in the left  midabdomen and a 5 mm trocar in the left flank.  The liver was retracted  with a Nathan's retractor placed through a 5 mm site in the  epigastrium.  Initially the angle of His was exposed and the peritoneum overlying the left  crus was incised leaving a lateral attachments to the fundus.  Blunt  dissection was carried directly down onto the left crus.  It was dissected  well posteriorly.  Following this, an avascular areas of the pars flaccida  was divided, the caudate lobe of the liver retracted to the right and the  base of the right crus identified.  Beginning at the medial edge, blunt  dissection was carried into the retroperitoneum with the finger retractor  and then it was passed without any force posterior to the upper stomach and  brought out through the previous dissected angle of His on top of the left  crus without any difficulty.  A 10 cm lap band system was then introduced  into the abdomen and the tubing placed through the finger retractor which  was withdrawn and the tubing and lap band brought around posterior to the  stomach.  The calibration of the tube was passed orally into the stomach and  then the tubing was inserted through the buckle and  the band snapped into  place without any tightness or difficulty and was seen to be in excellent  position.  The calibration tubing was removed.  The band was then imbricated  with 2-0 Vicryl interrupted sutures suturing the fundus up over the band to  the pouch at three sites.  Following this, the endo tubing was brought out  through the 12 mm trocar site of the right midabdomen and trocars were  removed and all CO2 evacuated.  The incision was extended about 1 cm  laterally and the anterior fascia cleared laterally.  The port was attached  to the tubing and then four interrupted 0 Prolene sutures were placed in  four quadrants on the anterior fascia, passed through the holes of the port  was then passed down to the fascia and the suture secured.  This left the  port site about  1 cm directly inferior to the lateral corner of the incision.  Following  this, skin  incisions were closed with interrupted 4-0 Monocryl and Steri-  Strips.  Sponge and instrument counts were correct.  Dry sterile dressings  were applied.  The patient was taken to the recovery room in good condition.      BTH/MEDQ  D:  10/20/2004  T:  10/20/2004  Job:  [id number]

## 2011-05-01 NOTE — Discharge Summary (Signed)
[facility]  Patient:    Erin Jimenez, Erin Jimenez                       MRN: [id number] Adm. Date:  [id number] Disc. Date: [id number] Attending:  Michaele Offer                           Discharge Summary  ADMISSION DIAGNOSES: 1. Pelvic pain. 2. History of endometriosis.  DISCHARGE DIAGNOSES: 1. Pelvic pain. 2. Endometriosis. 3. Leiomyomatous uterus.  PROCEDURES: 1. Laparoscopically-assisted vaginal hysterectomy. 2. Right salpingo-oophorectomy. 3. Cystoscopy.  COMPLICATIONS:  None.  CONSULTATIONS:  None.  HISTORY OF PRESENT ILLNESS:  Briefly, this is a 40 year old white female, para 0, who has had multiple prior surgeries for endometriosis and several evaluations for infertility.  She has recently noted increased pelvic pain and after consultation with Dr. Elesa Hacker at [facility] has decided that her chances of getting pregnant are minimal, and she wishes to proceed with definitive surgical therapy.  PAST MEDICAL HISTORY:  Significant for B12 deficiency.  PAST SURGICAL HISTORY:  Significant for laparoscopic cholecystectomy 1993, diagnostic laparoscopy for endometriosis 1993, laparotomy for endometriosis and uterine suspension 1994, laparoscopic lysis of adhesions and fulguration of endometriosis 1995, laparoscopic salpingo-oophorectomy for endometriosis 1998, laparoscopic lysis of adhesions again for endometriosis.  ALLERGIES:  DOXYCYCLINE, OXYCODONE, SULFA, and CIPRO.  ADMISSION PHYSICAL EXAMINATION:  VITAL SIGNS:  The patients weight is 250 pounds.  ABDOMEN:  Soft, nondistended without palpable masses.  Slightly tender in the right lower quadrant without rebound or guarding.  Multiple scars from previous laparoscopies and a laparotomy.  GENITOURINARY:  Pelvic exam reveals a tender right uterosacral ligament and a tender right adnexa with a small anteverted nontender uterus and normal left adnexa.  HOSPITAL COURSE:  The patient was  admitted on the day of surgery and underwent the above-mentioned procedures without complications.  This was performed under general anesthesia with an estimated blood loss of 100 cc.  Her right tube and ovary were adherent to each other, and she did have evidence of previous LSO and previous uterine suspension.  She tolerated the procedure well.  On the morning of postoperative day #1 she was able to tolerate liquids, and her incisions were healing well.  Preop hemoglobin was 12.6, postop was 1.0.  During that day she was able to ambulate and tolerate a regular diet and felt to be stable enough for discharge home.  Final pathology reveals normal right fallopian tube and ovary, benign cervix, uterus with three intramural leiomyomata and uterine cervix with a focus of endometriosis.  CONDITION ON DISCHARGE:  Stable.  DISPOSITION:  Discharged to home.  DISCHARGE INSTRUCTIONS:  Her diet is regular diet.  Her activity is pelvic rest.  No strenuous activity, no driving.  FOLLOWUP:  Her follow-up is in two weeks for an incision check.  DISCHARGE MEDICATIONS: 1. Percocet p.r.n. pain. 2. Premarin 0.9 mg p.o. q.d. DD:  09/08/00 TD:  09/08/00 Job: [id number]

## 2011-05-01 NOTE — H&P (Signed)
[facility]  Patient:    Erin Jimenez, Erin Jimenez                           MRN: [id number] Adm. Date:  08/19/00 Attending:  Zenaida Niece, M.D.                         History and Physical  CHIEF COMPLAINT:  Pelvic pain and history of endometriosis.  HISTORY OF PRESENT ILLNESS:  This is a 40 year old white female, para 0, who has had multiple prior surgeries for endometriosis.  She has undergone multiple courses of Clomid, also after being treated with Metformin to achieve ovulation induction without much success.  She was then referred to Dr. Morrison Old in [place] for infertility.  Since June of this year, she began having right lower quadrant pain which has progressively gotten worse and is achy and sharp in nature.  She has required narcotic medications to control this pain.  She has normal bowel movements and urination.  She was initially treated with Tylenol No. 3 and when her painpersisted, a pelvic ultrasound was performed which revealed a small ovarian cyst but an otherwise normal pelvis.  She states that this pain feelslike endometriosis that she has had in the past.  On exam at that time, she had a tender right uterosacral ligament and right adnexal area without palpable masses.  At that time, she wished to undergo conservative surgery with possible definitive surgery. Since then, she has consulted withDr. Elesa Hacker and agreed that her changes of getting pregnant are very slimand she now wishes to proceed with definitive surgical therapy with hysterectomy.  PAST MEDICAL HISTORY:  B12 deficiency.  PAST SURGICAL HISTORY:  Significant for laparoscopic cholecystectomy in 2000 without complications.  In 1993 she had a diagnostic laparoscopy for endometriosis.  In 1993, she had a laparotomy for endometriosis and uterine suspension.  In 1994, she had a laparoscopic lysis of adhesions and fulgurationof endometriosis.  In 1995, she had a  laparoscopic salpingo-oophorectomy, also for endometriosis, and in 1998, she had a laparoscopic lysis of adhesions, again for endometriosis.  ALLERGIES:  DOXYCYCLINE, OXYCODONE, SULFA, CIPRO.  CURRENT MEDICATIONS:  Tylenol No. 3 and B12 injections.  SOCIAL HISTORY:  Patient is married and denies alcohol, tobacco or drug use.  FAMILY HISTORY:  No GYN or colon cancer.  PHYSICAL EXAMINATION  GENERAL:  Weight is approximately 250 pounds.  Generally, she is a slightly obese white female who is in mild distress from her pelvic discomfort.  HEENT:  Pupils are equally round and reactive to light and accommodation and her extraocular muscles are intact.  Oropharynx is clear without erythema or exudates.  NECK:  Supple, without lymphadenopathy or thyromegaly.  LUNGS:  Clear to auscultation.  HEART:  Regular rate and rhythm, without murmurs.  ABDOMEN:  Soft, nondistended, without palpable masses.  She is slightly tender in the right lower quadrant, without rebound or guarding.  She has multiple scars from previous laparoscopies and a laparotomy.  EXTREMITIES:  No edema and are nontender.  DTRs are 2/4 and symmetric.  PELVIC:  External genitalia is without lesions.  On speculum exam, she also has no lesions and Pap smear performed October of 2000 was within normal limits.  On bimanual exam, she has a tender right uterosacral ligament and a tender right adnexa.  She has a small anteverted, nontender uterus and normal leftadnexa.  ASSESSMENT:  Recurrent pelvic pain, probably due  to endometriosis.  Patient has failed medical therapy and has had multiple prior conservative surgeries for endometriosis.  Due to her significant discomfort and previous surgeries, she now wishes definitive surgical therapy.  Risks of surgery have been discussed with the patient including permanent inability to have children and risks of bleeding, infection and damage tosurrounding organs.  The need  for estrogen-replacement therapy has also been discussed with the patient and she agrees to proceed.  PLAN:  Plan is to admit the patient for a laparoscopically assisted vaginal hysterectomy with right salpingo-oophorectomy, possible laparotomy and cystoscopy. DD:  08/18/00 TD:  08/18/00 Job: [id number]

## 2011-05-01 NOTE — Op Note (Signed)
[facility]  Patient:    Erin Jimenez, Erin Jimenez                       MRN: [id number] Proc. Date: 08/19/00 Adm. Date:  [id number] Disc. Date: [id number] Attending:  Michaele Offer                           Operative Report  PREOPERATIVE DIAGNOSES: 1. Pelvic pain. 2. Endometriosis.  POSTOPERATIVE DIAGNOSES: 1. Pelvic pain. 2. Endometriosis.  PROCEDURE:  Laparoscopic assisted vaginal hysterectomy with right salpingo-oophorectomy and cystoscopy.  SURGEON:  Zenaida Niece, M.D.  ASSISTANT:  Huel Cote, M.D.  ANESTHESIA:  General endotracheal tube.  ESTIMATED BLOOD LOSS:  100 cc.  CHEMOPROPHYLAXIS:  Ancef 1 gram.  FINDINGS:  Right tube and ovary were adherent to each other, and there was evidence of a previous left salpingo-oophorectomy and previous uterine suspension via the round ligaments.  She also had several spots of endometriosis and some filmy adhesions in the posterior cul-de-sac.  Counts correct.  Condition stable.  DESCRIPTION OF PROCEDURE:  After appropriate informed consent was obtained, the patient was taken to the operating room and placed in the dorsal spine position.  General anesthesia was induced, and she was placed in mobile stirrups.  Her abdomen, perineum, and vagina were prepped and draped in the usual sterile fashion for a laparoscopic and vaginal procedure.  Her bladder was drained with a red rubber catheter, and a Hulka tenaculum applied to her cervix for uterine manipulation.  Her infraumbilical skin was then infiltrates with 0.25% Marcaine, and a 3 cm horizontal incision was made.  The fascia was identified and entered sharply, and the peritoneum was identified and entered bluntly.  A purse-string suture of 0 Vicryl was placed around the fascia, and the Hasson cannula was introduced and the suture attached to it.  The abdomen was insufflated with CO2 gas, and placement of the cannula confirmed with  the laparoscope.  Two 5 mm ports were placed in the abdomen, one on the left and one on the right under direct visualization with the laparoscope.  Inspection then revealed the above-mentioned findings.  The right infundibulopelvic ligament, mesosalpinx, mesovarium, and right round ligament were taken down with the tripolar device to free-up the right tube and ovary.  This was done with some difficulty in identifying the ureter, but we did feel we were well above it.  The superior portion of the broad ligament on the right was also taken down with the tripolar device.  On the left side, the round ligament and upper broad ligament were taken down with the tripolar device, and the anterior peritoneum across the anterior portion of the uterus was taken down with the tripolar device and the bladder pushed inferiorly.  This achieved good mobilization, and all sites were found to be hemostatic.  The laparoscopic portion of the procedure was then completed.  The patients legs were then elevated in the stirrups, as we turned to the vaginal procedure.  A weighted speculum was inserted into the vagina and the cervix grasped with Christella Hartigan tenaculums.  The cervicovaginal mucosa was infiltrated with a dilute solution of Pitressin, and the cervicovaginal mucosa was then incised with electrocautery.  The posterior cul-de-sac was grasped with a Kocher clamp and entered sharply with scissors.  The Pannu speculum was inserted into the peritoneal cavity.  The bladder was sharply and bluntly pushed off anteriorly, but the anterior peritoneum was  not entered yet.  The ureterosacral ligaments were clamped, trisected, and ligated on each side with #1 chromic and tagged for later use.  Uterine arteries and lower broad ligament were clamped, transected, and ligated with #1 chromic.  At this time, the anterior peritoneum was entered, and the Deaver retractor was used to retract the bladder anteriorly.  The middle  portions of the broad ligament were clamped, transected, and ligated with #1 chromic, and the uterus was removed.  This was done with some difficulty, as the cervix was pretty long, and the uterus was fairly well supported.  Bleeding from the vaginal cuff was controlled with electrocautery.  I attempted to run the peritoneum to the vagina to help maintain adequate hemostasis, but the peritoneum could not adequately be identified.  Thus after electrocautery, the vagina was closed in a running fashion vertically with running, locking 0 Vicryl.  The previously tagged ureterosacral ligaments were tied together in the midline.  Irrigation in the vagina revealed hemostasis.  The patient was given indigo carmine IV, and a Foley catheter was placed.  There were 200 cc of water instilled into the bladder, and the Foley catheter was removed.  Cystoscopy was then performed with the 70 degree cystoscope and confirmed bilateral ureteral patency and no sutures in the bladder.  The cystoscope was removed, and the Foley catheter reinserted and hooked to drainage.  The patients legs were then lowered, and Dr. Senaida Ores and I and the scrub techs all changed gloves. The laparoscope was reinserted and the abdomen reinsufflated with CO2 gas. The pelvis was copiously irrigated, and some small bleeding sites controlled with bipolar cautery.  At the end of the procedure, the pelvis was hemostatic. The 5 mm ports were removed under direct visualization.  The laparoscope was removed and all gas allowed to deflate from the abdomen.  The Hasson cannula was removed, and the previously placed purse-string suture was tied together with adequate closure of the infraumbilical fascia.  The infraumbilical skin was closed with a running subcuticular suture of 4-0 Vicryl followed by Steri-Strips and a band-aid.  The 5 mm port incisions were closed with interrupted subcuticular sutures of 4-0 Vicryl followed by Steri-Strips  and band-aids.  The patient tolerated the procedure well, was extubated, and taken to the recovery room in stable condition. DD:  08/19/00 TD:  08/20/00 Job: [id number]

## 2011-06-08 ENCOUNTER — Ambulatory Visit
Admission: RE | Admit: 2011-06-08 | Discharge: 2011-06-08 | Disposition: A | Discharge status: Home / Self Care | Payer: 59 | Source: Ambulatory Visit | Attending: Neurosurgery | Admitting: Neurosurgery

## 2011-06-08 ENCOUNTER — Other Ambulatory Visit: Payer: Self-pay | Admitting: Neurosurgery

## 2011-06-08 DIAGNOSIS — G96198 Other disorders of meninges, not elsewhere classified: Secondary | ICD-10-CM

## 2011-06-09 ENCOUNTER — Encounter (HOSPITAL_COMMUNITY)
Admission: RE | Admit: 2011-06-09 | Discharge: 2011-06-09 | Disposition: A | Discharge status: Home / Self Care | Payer: 59 | Source: Ambulatory Visit | Attending: Neurosurgery | Admitting: Neurosurgery

## 2011-06-09 LAB — PROTIME-INR
INR: 0.97 (ref 0.00–1.49)
Prothrombin Time: 13.1 s (ref 11.6–15.2)

## 2011-06-09 LAB — CBC
HCT: 39.2 % (ref 36.0–46.0)
Hemoglobin: 13.3 g/dL (ref 12.0–15.0)
MCH: 29.4 pg (ref 26.0–34.0)
MCHC: 33.9 g/dL (ref 30.0–36.0)
MCV: 86.5 fL (ref 78.0–100.0)
Platelets: 295 K/uL (ref 150–400)
RBC: 4.53 MIL/uL (ref 3.87–5.11)
RDW: 13.4 % (ref 11.5–15.5)
WBC: 5.9 K/uL (ref 4.0–10.5)

## 2011-06-09 LAB — BASIC METABOLIC PANEL WITH GFR
BUN: 8 mg/dL (ref 6–23)
CO2: 28 meq/L (ref 19–32)
Calcium: 9.2 mg/dL (ref 8.4–10.5)
Chloride: 103 meq/L (ref 96–112)
Creatinine, Ser: 1.12 mg/dL — ABNORMAL HIGH (ref 0.50–1.10)
GFR calc Af Amer: >60 mL/min
GFR calc non Af Amer: 53 mL/min — ABNORMAL LOW
Glucose, Bld: 85 mg/dL (ref 70–99)
Potassium: 4.1 meq/L (ref 3.5–5.1)
Sodium: 138 meq/L (ref 135–145)

## 2011-06-09 LAB — APTT: aPTT: 29 s (ref 24–37)

## 2011-06-09 LAB — SURGICAL PCR SCREEN
MRSA, PCR: NEGATIVE
Staphylococcus aureus: NEGATIVE

## 2011-06-11 ENCOUNTER — Inpatient Hospital Stay (HOSPITAL_COMMUNITY)
Admission: RE | Admit: 2011-06-11 | Discharge: 2011-06-13 | DRG: 030 | Disposition: A | Discharge status: Home / Self Care | Payer: 59 | Source: Ambulatory Visit | Attending: Neurosurgery | Admitting: Neurosurgery

## 2011-06-11 DIAGNOSIS — G969 Disorder of central nervous system, unspecified: Principal | ICD-10-CM | POA: Diagnosis present

## 2011-06-11 DIAGNOSIS — G96198 Other disorders of meninges, not elsewhere classified: Secondary | ICD-10-CM | POA: Diagnosis present

## 2011-06-11 DIAGNOSIS — Z01812 Encounter for preprocedural laboratory examination: Secondary | ICD-10-CM

## 2011-06-11 DIAGNOSIS — Y838 Other surgical procedures as the cause of abnormal reaction of the patient, or of later complication, without mention of misadventure at the time of the procedure: Secondary | ICD-10-CM | POA: Diagnosis present

## 2011-06-11 DIAGNOSIS — K219 Gastro-esophageal reflux disease without esophagitis: Secondary | ICD-10-CM | POA: Diagnosis present

## 2011-06-11 LAB — TYPE AND SCREEN
ABO/RH(D): O POS
Antibody Screen: NEGATIVE

## 2011-06-18 NOTE — Discharge Summary (Signed)
  Erin Jimenez, Erin Jimenez                  ACCOUNT NO.:  [id number]  MEDICAL RECORD NO.:  [id number]  LOCATION:  3007                         FACILITY:  [facility]  PHYSICIAN:  Hilda Lias, M.D.   DATE OF BIRTH:  December 08, 1971  DATE OF ADMISSION:  06/11/2011 DATE OF DISCHARGE:  06/13/2011                              DISCHARGE SUMMARY   ADMISSION DIAGNOSIS:  Occipital cervical pseudomeningocele.  FINAL DIAGNOSIS:  Occipital cervical pseudomeningocele.  CLINICAL HISTORY:  The patient was admitted because of pseudomeningocele.  The patient previously had a Chiari malformation. She developed a pseudomeningocele.  Laboratory normal.  COURSE IN THE HOSPITAL:  She was taken to surgery on June 11, 2011, and the repair of the pseudomeningocele was done by Dr. Shirlean Kelly. Today, she is doing great and she is ready to go home.  CONDITION ON DISCHARGE:  Improvement.  MEDICATION:  Hydrocodone for pain.  DIET:  Regular.  ACTIVITY:  Not to drive.  Not to do any heavy lifting.  FOLLOWUP:  She will be seen by Dr. Newell Coral in 10 days.          ______________________________ Hilda Lias, M.D.     EB/MEDQ  D:  06/13/2011  T:  06/13/2011  Job:  [id number]  Electronically Signed by Hilda Lias M.D. on 06/18/2011 01:32:31 PM

## 2011-06-22 NOTE — H&P (Signed)
Erin Jimenez, Erin Jimenez                  ACCOUNT NO.:  [id number]  MEDICAL RECORD NO.:  [id number]  LOCATION:  3110                         FACILITY:  [facility]  PHYSICIAN:  Hewitt Shorts, M.D.DATE OF BIRTH:  12-04-1971  DATE OF ADMISSION:  06/11/2011 DATE OF DISCHARGE:                             HISTORY & PHYSICAL   HISTORY OF PRESENT ILLNESS:  The patient is a 40 year old right-handed white female who is over 2 months status post Chiari and upper cervical decompression with suboccipital craniectomy, upper cervical laminectomy, duraplasty at C3-C5, posterior cervical arthrodesis with lateral mass screws and bone graft.  She did well through the initial postoperative period, but about 3 weeks ago began to develop subtle swelling and fluid collection beneath her incision.  She was at that point about 7 weeks status post her surgery and had not had any difficulties with swelling in and around the incision underneath the incision.  Over the past 2-1/2 weeks we had her staying upright to try and reduce the pressure on that area and to allow to heal on its own, however, the pseudomeningocele progressively enlarged and therefore decision was made to readmit the patient for repair of pseudomeningocele.  Other than for localized discomfort, she is not having any new difficulties.  PAST MEDICAL HISTORY:  She does not describe any history of hypertension, myocardial function, cancer, stroke, diabetes, peptic ulcer disease, or lung disease.  PAST SURGICAL HISTORY:  Previous surgeries include cholecystectomy in 2000, hysterectomy in 2001, lap-band in 2006 which failed and is no longer used as well as her Chiari and upper cervical decompression and stabilization as described above.  ALLERGIES:  She has no allergies to medications but intolerances to CIPRO, DOXYCYCLINE, OXYCODONE, and SULFA all of which cause nausea.  CURRENT MEDICATIONS:  Wellbutrin, Estratest, hydrocodone, Ambien, Claritin,  and vitamin B12 injections.  FAMILY HISTORY:  She mother's age 65 with diabetes.  Father's age 38 with non-Hodgkin lymphoma.  A niece was diagnosed with Chiari malformation a year or 2 ago, but has not require decompression and presented with headaches.  SOCIAL HISTORY:  The patient works as a Human resources officer for [employer].  She is married.  She does not smoke.  She does alcoholic beverages socially.  She denies history of substance abuse.  REVIEW OF SYSTEMS:  Notable for as described in her past medical history, but 14-point review is unremarkable.  PHYSICAL EXAMINATION:  GENERAL:  The patient a well-developed, well- nourished white female in no acute distress. VITAL SIGNS:  Temperature 97.4, pulse 80, blood pressure 135/70, and respiratory rate 20. LUNGS:  Clear to auscultation.  She has symmetric respiratory excursion. HEART:  Regular rate and rhythm.  Normal S1 and S2.  There is no murmur. ABDOMEN:  Soft, nontender, nondistended.  Bowel sounds are present. EXTREMITIES:  No clubbing, cyanosis, or edema.  External examination shows a large fluid collection beneath the posterior craniocervical incision consistent with a pseudomeningocele.  There is no erythema or drainage. NEUROLOGIC:  Mental status, the patient is awake, alert, fully oriented. Speech is fluent.  She has good comprehension.  Cranial nerves show pupils are equal, round, and reactive to light.  Extraocular movements intact.  Facial movement is symmetrical.  Motor examination shows 5/5 strength in the upper and lower extremities.  Sensation is intact to pinprick.  IMPRESSION:  Delayed pseudomeningocele which developed about 7 weeks following Chiari decompression, upper cervical decompression, duraplasty, and posterolateral cervical arthrodesis which has steadily increased over the past 3 weeks.  PLAN:  The patient admitted for exploration of the wound and repair of pseudomeningocele.  We have  discussed the nature of her condition, the nature of the surgical procedure, ________ surgery, hospital stay, recuperation, limitations postoperatively, and risks of surgery include wrist infection, bleeding, polyps, and need for transfusion, the risk of neurologic dysfunction with paralysis, weakness, numbness, tingling, and so on.  Anesthetic risk of myocardial function, stroke, pneumonia, and death.  We also discussed the risk of recurrence of the pseudomeningocele and possibly need further surgery.  Understand all of this and she wished to go ahead with surgery and was admitted for such.     Hewitt Shorts, M.D.     RWN/MEDQ  D:  06/11/2011  T:  06/12/2011  Job:  [id number]  Electronically Signed by Shirlean Kelly M.D. on 06/22/2011 08:34:34 AM

## 2011-06-22 NOTE — Op Note (Signed)
NAMECLARIS, Erin Jimenez                  ACCOUNT NO.:  [id number]  MEDICAL RECORD NO.:  [id number]  LOCATION:  3110                         FACILITY:  [facility]  PHYSICIAN:  Hewitt Shorts, M.D.DATE OF BIRTH:  [birth year]  DATE OF PROCEDURE:  06/11/2011 DATE OF DISCHARGE:                              OPERATIVE REPORT   PREOPERATIVE DIAGNOSIS:  Pseudomeningocele following Chiari decompression and duraplasty 9-1/2 weeks ago.  POSTOPERATIVE DIAGNOSIS:  Pseudomeningocele following Chiari decompression and duraplasty 9-1/2 weeks ago.  PROCEDURE:  Repair of pseudomeningocele.  SURGEON:  Hewitt Shorts, MD  ASSISTANT:  [name], MD  ANESTHESIA:  General endotracheal.  INDICATIONS:  The patient is a 40 year old woman who is 9-1/2 weeks status post Chiari decompression, suboccipital craniectomy, upper cervical laminectomy, duraplasty and a C3 to C5 posterior cervical arthrodesis with lateral mass screws, rods and bone graft.  She did well postoperatively, but about 3 weeks ago, began to notice a bit of swelling in the back of her neck underlying the incision.  We saw her in the office 2-1/2 weeks ago and treated it with elevation, but it continued to enlarge and a decision was made to proceed with exploration of the wound and repair of pseudomeningocele.  PROCEDURE:  The patient was brought to the operating room, placed under general endotracheal anesthesia.  The patient was placed in a 3-pin Mayfield head holder and turned to prone position.  The occipital scalp was shaved and then the occipital scalp, neck and upper back were prepped with Betadine soap and solution, and draped in a sterile fashion.  A previous suboccipital and upper cervical midline incision was reopened, although we did not reopen the entire length of the exposure.  Once getting below the subcutaneous layer, we entered into the pseudomeningocele cavity and were able to evacuate the built-up fluid, and then  there is a small bridging island of muscle tissue that healed across the midline.  This was opened and we were able to dissect down to the skull and duraplasty.  We were able to expose the entire duraplasty and could see slow leakage of CSF along the right lateral suture line.  We therefore resutured the entire right lateral suture line beginning at the superolateral aspect and ending at the inferior midline corner.  We used a 6-0 Prolene in a running fashion and good closure was achieved.  We Valsalva'd the patient several times and did not see any further CSF leakage along the suture line or from any of other suture lines or the corners.  There was a bit of weeping from the walls of the pseudomeningocele.  Once we confirmed good closure of the suture line, we injected over the suture line, a coating of DuraSeal and then we proceeded with closure. The paracervical and nuchal muscles were approximated with interrupted undyed 0 Vicryl sutures.  Scarpa fascia was closed with interrupted inverted 0 and 2-0 undyed Vicryl sutures and the subcutaneous and subcuticular were closed with interrupted inverted 2-0 and 3-0 undyed Vicryl sutures.  Skin was approximated with surgical staples.  The wound was dressed with Adaptic and sterile gauze and 4-inch Hypafix. Following surgery, the patient was turned back to  supine position. The 3- pin Mayfield head holder was removed and she is to be reversed the anesthetic, extubated, transferred to recovery room for further care. Estimated blood loss was less than 25 mL.  Sponge count correct.     Hewitt Shorts, M.D.     RWN/MEDQ  D:  06/11/2011  T:  06/12/2011  Job:  [id number]  Electronically Signed by Shirlean Kelly M.D. on 06/22/2011 08:34:38 AM

## 2011-06-23 ENCOUNTER — Other Ambulatory Visit (HOSPITAL_COMMUNITY): Payer: Self-pay | Admitting: Neurosurgery

## 2011-06-23 ENCOUNTER — Ambulatory Visit (HOSPITAL_COMMUNITY)
Admission: RE | Admit: 2011-06-23 | Discharge: 2011-06-23 | Disposition: A | Discharge status: Home / Self Care | Payer: 59 | Source: Ambulatory Visit | Attending: Neurosurgery | Admitting: Neurosurgery

## 2011-06-23 DIAGNOSIS — G039 Meningitis, unspecified: Secondary | ICD-10-CM

## 2011-06-23 DIAGNOSIS — G969 Disorder of central nervous system, unspecified: Secondary | ICD-10-CM

## 2011-06-23 DIAGNOSIS — G96198 Other disorders of meninges, not elsewhere classified: Secondary | ICD-10-CM

## 2011-06-23 DIAGNOSIS — R51 Headache: Secondary | ICD-10-CM | POA: Insufficient documentation

## 2011-06-24 ENCOUNTER — Ambulatory Visit (HOSPITAL_COMMUNITY)
Admission: RE | Admit: 2011-06-24 | Discharge: 2011-06-24 | Disposition: A | Discharge status: Home / Self Care | Payer: 59 | Source: Ambulatory Visit | Attending: Neurosurgery | Admitting: Neurosurgery

## 2011-06-24 ENCOUNTER — Encounter: Payer: Self-pay | Admitting: Internal Medicine

## 2011-06-24 ENCOUNTER — Ambulatory Visit: Payer: 59 | Admitting: Internal Medicine

## 2011-06-24 VITALS — BP 143/82 | HR 90 | Temp 99.5°F | Ht 66.5 in | Wt 261.8 lb

## 2011-06-24 DIAGNOSIS — G039 Meningitis, unspecified: Secondary | ICD-10-CM

## 2011-06-24 DIAGNOSIS — G96198 Other disorders of meninges, not elsewhere classified: Secondary | ICD-10-CM | POA: Insufficient documentation

## 2011-06-24 DIAGNOSIS — Z01812 Encounter for preprocedural laboratory examination: Secondary | ICD-10-CM | POA: Insufficient documentation

## 2011-06-24 LAB — CBC
HCT: 35.3 % — ABNORMAL LOW (ref 36.0–46.0)
Hemoglobin: 11.8 g/dL — ABNORMAL LOW (ref 12.0–15.0)
MCH: 28.6 pg (ref 26.0–34.0)
MCHC: 33.4 g/dL (ref 30.0–36.0)
MCV: 85.5 fL (ref 78.0–100.0)
Platelets: 310 10*3/uL (ref 150–400)
RBC: 4.13 MIL/uL (ref 3.87–5.11)
RDW: 13.4 % (ref 11.5–15.5)
WBC: 6.8 10*3/uL (ref 4.0–10.5)

## 2011-06-24 LAB — CSF CELL COUNT WITH DIFFERENTIAL
Eosinophils, CSF: 4 % — ABNORMAL HIGH (ref 0–1)
Lymphs, CSF: 53 % (ref 40–80)
Monocyte-Macrophage-Spinal Fluid: 10 % — ABNORMAL LOW (ref 15–45)
RBC Count, CSF: 0 /mm3
Segmented Neutrophils-CSF: 33 % — ABNORMAL HIGH (ref 0–6)
Tube #: 2
WBC, CSF: 292 /mm3 (ref 0–5)

## 2011-06-24 LAB — DIFFERENTIAL
Basophils Absolute: 0 10*3/uL (ref 0.0–0.1)
Basophils Relative: 0 % (ref 0–1)
Eosinophils Absolute: 0.2 10*3/uL (ref 0.0–0.7)
Eosinophils Relative: 2 % (ref 0–5)
Lymphocytes Relative: 33 % (ref 12–46)
Lymphs Abs: 2.3 10*3/uL (ref 0.7–4.0)
Monocytes Absolute: 0.7 10*3/uL (ref 0.1–1.0)
Monocytes Relative: 10 % (ref 3–12)
Neutro Abs: 3.7 10*3/uL (ref 1.7–7.7)
Neutrophils Relative %: 54 % (ref 43–77)

## 2011-06-24 LAB — BASIC METABOLIC PANEL
BUN: 7 mg/dL (ref 6–23)
CO2: 27 mEq/L (ref 19–32)
Calcium: 8.9 mg/dL (ref 8.4–10.5)
Chloride: 103 mEq/L (ref 96–112)
Creatinine, Ser: 1.02 mg/dL (ref 0.50–1.10)
GFR calc Af Amer: >60 mL/min (ref >60)
GFR calc non Af Amer: 59 mL/min — ABNORMAL LOW (ref >60)
Glucose, Bld: 81 mg/dL (ref 70–99)
Potassium: 4.1 mEq/L (ref 3.5–5.1)
Sodium: 140 mEq/L (ref 135–145)

## 2011-06-24 LAB — GRAM STAIN

## 2011-06-24 LAB — PROTEIN AND GLUCOSE, CSF
Glucose, CSF: 32 mg/dL — ABNORMAL LOW (ref 43–76)
Total  Protein, CSF: 80 mg/dL — ABNORMAL HIGH (ref 15–45)

## 2011-06-24 LAB — PATHOLOGIST SMEAR REVIEW: Tech Review: INCREASED

## 2011-06-24 NOTE — Progress Notes (Signed)
  Subjective:    Patient ID: Erin Jimenez, female    DOB: 10-20-1971, 40 y.o.   MRN: [id number]  HPI sent from neurosurgery for concern of meningitis.  Has a history of Chiari malformation and developed a pseudomeningocele which was repaired  By neurosurgery on 06/11/11.  Has had persistent headaches but no changes.  Is also in need of shunt placement but LP done and signficant for increased WBCs.  Patient also reports some afternoon fevers.      Review of Systems  Constitutional: Positive for fever and fatigue.  HENT: Negative for hearing loss and neck stiffness.        Headache  Eyes: Negative.   Respiratory: Negative.   Cardiovascular: Negative.   Gastrointestinal: Negative.   Genitourinary: Negative.   Musculoskeletal: Negative.   Skin: Negative.   Neurological: Negative.   Hematological: Negative.   Psychiatric/Behavioral: Negative.        Objective:   Physical Exam  Constitutional: She is oriented to person, place, and time. She appears well-developed and well-nourished. No distress.  HENT:  Mouth/Throat: No oropharyngeal exudate.  Neck: Normal range of motion. Neck supple.  Cardiovascular: Normal rate, regular rhythm and normal heart sounds.  Exam reveals no gallop.   No murmur heard. Pulmonary/Chest: Effort normal and breath sounds normal. No respiratory distress. She has no wheezes.  Abdominal: Soft. Bowel sounds are normal. She exhibits no distension. There is no tenderness. There is no rebound.  Lymphadenopathy:    She has no cervical adenopathy.  Neurological: She is alert and oriented to person, place, and time. No cranial nerve deficit or sensory deficit.          Assessment & Plan:

## 2011-06-24 NOTE — Assessment & Plan Note (Addendum)
Gram stain and culture negative.  Possible infectious etiology broad.  I discussed with the patient antibiotic options and she feels she is happy to do injections at home if needed.  Therefore will start vancomycin and meropenem, meningitis doses for 2 weeks and have her follow up at that time.  PICC line and antibiotics being arranged with [facility].  Will be able to do at home.

## 2011-06-25 ENCOUNTER — Other Ambulatory Visit (HOSPITAL_COMMUNITY): Payer: 59

## 2011-06-25 NOTE — Progress Notes (Signed)
PIC placement and first doses of vancomycin and meropenem scheduled for Friday, June 26, 2011 @ 1000, [facility].  [facility] to start Friday, June 26, 2011 as well.  Signed orders faxed to [facility] and [facility] with appropriate patient and insurance information.  Jennet Maduro, RN.

## 2011-06-26 ENCOUNTER — Ambulatory Visit (HOSPITAL_COMMUNITY)
Admission: RE | Admit: 2011-06-26 | Discharge: 2011-06-26 | Disposition: A | Discharge status: Home / Self Care | Payer: 59 | Source: Ambulatory Visit | Attending: Internal Medicine | Admitting: Internal Medicine

## 2011-06-26 ENCOUNTER — Other Ambulatory Visit: Payer: Self-pay | Admitting: Internal Medicine

## 2011-06-26 ENCOUNTER — Inpatient Hospital Stay (HOSPITAL_COMMUNITY): Admission: RE | Admit: 2011-06-26 | Payer: 59 | Source: Ambulatory Visit | Admitting: Neurosurgery

## 2011-06-26 DIAGNOSIS — G039 Meningitis, unspecified: Secondary | ICD-10-CM

## 2011-06-26 DIAGNOSIS — Z452 Encounter for adjustment and management of vascular access device: Secondary | ICD-10-CM | POA: Insufficient documentation

## 2011-06-27 LAB — CSF CULTURE W GRAM STAIN: Culture: NO GROWTH

## 2011-06-29 NOTE — Op Note (Signed)
  NAMEJUSTYNE, Erin Jimenez                  ACCOUNT NO.:  [id number]  MEDICAL RECORD NO.:  [id number]  LOCATION:  [place]                         FACILITY:  [facility]  PHYSICIAN:  Hewitt Shorts, M.D.DATE OF BIRTH:  22-Jan-1971  DATE OF PROCEDURE:  06/24/2011 DATE OF DISCHARGE:                              OPERATIVE REPORT   PREOPERATIVE DIAGNOSES:  Low-grade fever, status post Chiari decompression and repair of pseudomeningocele, rule out meningitis.  POSTOPERATIVE DIAGNOSES:  Low-grade fever, status post Chiari decompression and repair of pseudomeningocele, rule out meningitis.  PROCEDURE:  Lumbar puncture with fluoroscopic guidance.  SURGEON:  Hewitt Shorts, MD  ANESTHESIA:  1% Xylocaine and Valium 10 mg p.o. prior to the procedure.  INDICATION:  The patient is a 40 year old woman about 2 months status post Chiari decompression including duraplasty.  She developed a pseudomeningocele, underwent repair about 2 weeks ago.  There has been arecurrence of the pseudomeningocele and she has had some low-grade fevers on an intermittent basis and a decision was made to proceed with lumbar puncture to rule out meningitis.  PROCEDURE IN DETAIL:  The patient was brought to the Radiology Fluoroscopy suite and using fluoroscopy, the L4-5 interlaminar space was identified.  The skin of the back was prepped with Betadine solution and then we infiltrated the skin and subcutaneous tissue with 1% Xylocaine and we then passed a 4-1/2-inch 20-gauge spinal needle into the lumbar cistern.  Clear CSF was noted and we collected a total of 5 mL of clear CSF in 3 collection tubes.  It was sent to the laboratory for studies, tube 1 for culture and sensitivity and Gram stain and tube 2 for glucose, protein, and cell count, tube 3 is to be held.  The procedure was tolerated well.  A Band-Aid was applied after the needle was removed and the patient is going to be observed for 2 hours in the short-stay unit and  discharged to home.  She is to be kept flat until this evening, and she is to drink fluids vigorously.     Hewitt Shorts, M.D.     RWN/MEDQ  D:  06/24/2011  T:  06/24/2011  Job:  [id number]  Electronically Signed by Shirlean Kelly M.D. on 06/29/2011 07:37:40 AM

## 2011-07-02 ENCOUNTER — Telehealth: Payer: Self-pay | Admitting: *Deleted

## 2011-07-02 NOTE — Telephone Encounter (Signed)
Wanted to know lab results from last labs & CSF done in hospital. I read most of them to her & told her I will send a message to Dr. Luciana Axe about them. He will  call her - [phone]

## 2011-07-10 ENCOUNTER — Telehealth: Payer: Self-pay | Admitting: *Deleted

## 2011-07-10 NOTE — Telephone Encounter (Signed)
[facility] Nurse called patients antibiotics are finished and wanted to know whether to pull PICC or leave in until her appointment 07/14/11.  Advised to leave in until office visit. Wendall Mola CMA

## 2011-07-13 ENCOUNTER — Telehealth: Payer: Self-pay | Admitting: *Deleted

## 2011-07-13 NOTE — Telephone Encounter (Signed)
Pt's last dose of IV Vanc was 07/10/11.  Appt with Dr. Luciana Axe is 07/14/11.  [facility] will d/c labs for today due to the pt not receiving IV Vanc now.  [facility] will pull the [facility] after seeing Dr. Luciana Axe tomorrow.  Will need a call from [facility] to pull the [facility].  Jennet Maduro, RN

## 2011-07-14 ENCOUNTER — Encounter: Payer: Self-pay | Admitting: Internal Medicine

## 2011-07-14 ENCOUNTER — Ambulatory Visit (INDEPENDENT_AMBULATORY_CARE_PROVIDER_SITE_OTHER): Payer: 59 | Admitting: Internal Medicine

## 2011-07-14 DIAGNOSIS — G96198 Other disorders of meninges, not elsewhere classified: Secondary | ICD-10-CM

## 2011-07-14 DIAGNOSIS — G039 Meningitis, unspecified: Secondary | ICD-10-CM

## 2011-07-14 NOTE — Progress Notes (Signed)
  Subjective:    Patient ID: Erin Jimenez, female    DOB: May 30, 1971, 40 y.o.   MRN: [id number]  HPI sent from neurosurgery 2 weeks ago for concern of meningitis. Has a history of Chiari malformation and developed a pseudomeningocele which was repaired By neurosurgery on 06/11/11. Has had persistent headaches but no changes. Is also in need of shunt placement but LP done and signficant for increased WBCs. Patient also reports some afternoon fevers.     Now the patient reports that her fevers and chills have completely resolved and did so soon after starting the antibiotics.  She completed broad spectrum coverage with vancomycin and cefipime without complications or interuptions.  She feels well today and in fact reports she has had no further CSF drainage.       Review of Systems  Constitutional: Negative.   HENT: Negative.   Eyes: Negative.   Respiratory: Negative.   Cardiovascular: Negative.   Gastrointestinal: Negative.   Neurological:       Unchanged mild headaches  Psychiatric/Behavioral: Negative.        Objective:   Physical Exam  Constitutional: She appears well-developed and well-nourished.  Cardiovascular: Normal rate, regular rhythm and normal heart sounds.   Pulmonary/Chest: Effort normal and breath sounds normal. No respiratory distress.  Lymphadenopathy:    She has no cervical adenopathy.  Skin: Skin is warm and dry.       +picc with no erythema.  Some dried blood.           Assessment & Plan:   No problem-specific assessment & plan notes found for this encounter.

## 2011-07-14 NOTE — Progress Notes (Signed)
Order to remove PICC line obtained from Dr. Luciana Axe. Pt. identified with name and date of birth. PICC dressing removed, site unremarkable. PICC line removed using sterile procedure @ 1500 pm. PICC length equal to that noted in pt's hospital chart of 40 cm. Sterile petroleum gauze + sterile 4X4 applied to PICC site, pressure applied for 10 minutes and covered with Medipore tape as a pressure dressing. Pt. instructed to limit use of arm for 1 hour. Pt. instructed that the pressure dressing should remain in place for 24 hours. Pt. verablized understanding of these instructions.   Jennet Maduro, RN

## 2011-07-14 NOTE — Assessment & Plan Note (Signed)
Stable now and no further signs of infection.  No organism grew.  Will have PICC pulled and discontinue antibiotics.  Patient to follow up with neurosurgery soon and decide on further care.     She was advised to call at anytime if she needs any further follow up or if her Dr. Requests any.  Otherwise, follow up PRN.

## 2011-07-24 ENCOUNTER — Other Ambulatory Visit: Payer: Self-pay | Admitting: Neurosurgery

## 2011-07-24 DIAGNOSIS — M542 Cervicalgia: Secondary | ICD-10-CM

## 2011-07-24 DIAGNOSIS — G935 Compression of brain: Secondary | ICD-10-CM

## 2011-07-24 DIAGNOSIS — M792 Neuralgia and neuritis, unspecified: Secondary | ICD-10-CM

## 2011-07-29 ENCOUNTER — Ambulatory Visit
Admission: RE | Admit: 2011-07-29 | Discharge: 2011-07-29 | Disposition: A | Discharge status: Home / Self Care | Payer: 59 | Source: Ambulatory Visit | Attending: Neurosurgery | Admitting: Neurosurgery

## 2011-07-29 DIAGNOSIS — G935 Compression of brain: Secondary | ICD-10-CM

## 2011-07-29 DIAGNOSIS — M542 Cervicalgia: Secondary | ICD-10-CM

## 2011-07-29 DIAGNOSIS — M792 Neuralgia and neuritis, unspecified: Secondary | ICD-10-CM

## 2011-09-28 LAB — CLOTEST (H. PYLORI), BIOPSY: Helicobacter screen: NEGATIVE — AB

## 2011-10-26 ENCOUNTER — Other Ambulatory Visit: Payer: Self-pay | Admitting: Neurosurgery

## 2011-10-26 DIAGNOSIS — M542 Cervicalgia: Secondary | ICD-10-CM

## 2011-10-26 DIAGNOSIS — M5412 Radiculopathy, cervical region: Secondary | ICD-10-CM

## 2011-12-05 ENCOUNTER — Ambulatory Visit
Admission: RE | Admit: 2011-12-05 | Discharge: 2011-12-05 | Disposition: A | Discharge status: Home / Self Care | Payer: 59 | Source: Ambulatory Visit | Attending: Neurosurgery | Admitting: Neurosurgery

## 2011-12-05 DIAGNOSIS — M5412 Radiculopathy, cervical region: Secondary | ICD-10-CM

## 2011-12-05 DIAGNOSIS — M542 Cervicalgia: Secondary | ICD-10-CM

## 2012-01-27 ENCOUNTER — Encounter: Payer: Self-pay | Admitting: Internal Medicine

## 2012-02-09 ENCOUNTER — Encounter: Payer: Self-pay | Admitting: Family Medicine

## 2012-02-10 ENCOUNTER — Encounter: Payer: Self-pay | Admitting: Internal Medicine

## 2012-02-10 ENCOUNTER — Ambulatory Visit (INDEPENDENT_AMBULATORY_CARE_PROVIDER_SITE_OTHER): Payer: 59 | Admitting: Internal Medicine

## 2012-02-10 DIAGNOSIS — Z0000 Encounter for general adult medical examination without abnormal findings: Secondary | ICD-10-CM

## 2012-02-10 DIAGNOSIS — J301 Allergic rhinitis due to pollen: Secondary | ICD-10-CM | POA: Insufficient documentation

## 2012-02-10 DIAGNOSIS — E538 Deficiency of other specified B group vitamins: Secondary | ICD-10-CM

## 2012-02-10 DIAGNOSIS — K219 Gastro-esophageal reflux disease without esophagitis: Secondary | ICD-10-CM

## 2012-02-10 DIAGNOSIS — Z6841 Body Mass Index (BMI) 40.0 and over, adult: Secondary | ICD-10-CM | POA: Insufficient documentation

## 2012-02-10 DIAGNOSIS — G4700 Insomnia, unspecified: Secondary | ICD-10-CM | POA: Insufficient documentation

## 2012-02-10 LAB — POCT URINALYSIS DIPSTICK
Bilirubin, UA: NEGATIVE
Blood, UA: NEGATIVE
Glucose, UA: NEGATIVE
Ketones, UA: NEGATIVE
Leukocytes, UA: NEGATIVE
Nitrite, UA: NEGATIVE
Protein, UA: NEGATIVE
Spec Grav, UA: 1.01
Urobilinogen, UA: 0.2
pH, UA: 7

## 2012-02-10 LAB — POCT GLYCOSYLATED HEMOGLOBIN (HGB A1C): Hemoglobin A1C: 4.8

## 2012-02-10 MED ORDER — ZOLPIDEM TARTRATE 10 MG PO TABS: 10.0000 mg | ORAL_TABLET | Freq: Every evening | ORAL | Status: DC | PRN

## 2012-02-10 MED ORDER — OMEPRAZOLE 40 MG PO CPDR: 40.0000 mg | DELAYED_RELEASE_CAPSULE | Freq: Every day | ORAL | Status: DC

## 2012-02-10 MED ORDER — CYANOCOBALAMIN 1000 MCG/ML IJ SOLN: 1000.0000 ug | INTRAMUSCULAR | Status: AC

## 2012-02-10 MED ORDER — FLUTICASONE PROPIONATE 50 MCG/ACT NA SUSP: 2.0000 | Freq: Every day | NASAL | Status: DC

## 2012-02-10 NOTE — Progress Notes (Signed)
  Subjective:    Patient ID: Erin Jimenez, female    DOB: 03-Jul-1971, 41 y.o.   MRN: [id number]  HPI Followup for annual exam Problem #1 obesity Problem #2 depression Problem #3 Chiari 1 malformation with syringomyelia/persistent numbness right trigeminal status post surgery x2/persistent neurological deficits Problem #4 GERD Problem #5 allergic rhinitis Problem #6 depression anxiety and insomnia Problem #7 B12 deficiency She continues to be optimistic despite her numerous medical problems.. Has a good time cruising with her husband. Her significant neurosurgical problems emerged after her presentation here November 2011 with neck pain and numbness in right side of the face. She had a history since childhood of near syncope with laughing or coughing or other maneuvers increased vagal tone. She would have frequent dizziness with the same issues. Surgery Chiari 1 malformation was complicated by the development of a pseudomeningocele which was removed with a second surgery. Dr. Newell Coral.  Review of SystemsShe continues to work way too hard and has little time to pursue an exercise program which he needs. The rest of the review systems is scanned in     Objective:   Physical Exam Vital signs are stable except for BMI over 40 Head is normocephalic and HEENT is clear The neck has a decreased range of motion with a large scar on the posterior aspect There is no thyromegaly or cervical adenopathy The heart has a regular  rhythm without murmurs rubs or gallops Lungs are clear The abdomen has adiposity but no organomegaly or tenderness The extent extremities have trace edema but no joint abnormalities Neurological reveals decreased sensory over the right trigeminal distribution and an abnormal Romberg and tandem gait        Assessment & Plan:  1Problem #1 annual exam-routine labs Problem #2 BMI of 40-49 Commitment to exercise discussed Problem #3 marked neurological and neurosurgical  problems as noted Problem #4 depression and anxiety no change in meds Problem #5 B12 deficiency continue monthly injections Problem #6 allergic rhinitis continue meds He is on estrogen supplementation per GYN/they also prescribed her Wellbutrin  After labs we'll contact with results and followup plan

## 2012-02-11 LAB — CBC WITH DIFFERENTIAL/PLATELET
Basophils Absolute: 0 10*3/uL (ref 0.0–0.1)
Basophils Relative: 1 % (ref 0–1)
Eosinophils Absolute: 0.1 10*3/uL (ref 0.0–0.7)
Eosinophils Relative: 2 % (ref 0–5)
HCT: 42 % (ref 36.0–46.0)
Hemoglobin: 13.9 g/dL (ref 12.0–15.0)
Lymphocytes Relative: 39 % (ref 12–46)
Lymphs Abs: 1.9 10*3/uL (ref 0.7–4.0)
MCH: 29.4 pg (ref 26.0–34.0)
MCHC: 33.1 g/dL (ref 30.0–36.0)
MCV: 89 fL (ref 78.0–100.0)
Monocytes Absolute: 0.4 10*3/uL (ref 0.1–1.0)
Monocytes Relative: 8 % (ref 3–12)
Neutro Abs: 2.5 10*3/uL (ref 1.7–7.7)
Neutrophils Relative %: 50 % (ref 43–77)
Platelets: 305 10*3/uL (ref 150–400)
RBC: 4.72 MIL/uL (ref 3.87–5.11)
RDW: 13.4 % (ref 11.5–15.5)
WBC: 4.9 10*3/uL (ref 4.0–10.5)

## 2012-02-11 LAB — COMPREHENSIVE METABOLIC PANEL
ALT: 16 U/L (ref 0–35)
AST: 16 U/L (ref 0–37)
Albumin: 4.1 g/dL (ref 3.5–5.2)
Alkaline Phosphatase: 90 U/L (ref 39–117)
BUN: 10 mg/dL (ref 6–23)
CO2: 26 mEq/L (ref 19–32)
Calcium: 8.8 mg/dL (ref 8.4–10.5)
Chloride: 103 mEq/L (ref 96–112)
Creat: 1.09 mg/dL (ref 0.50–1.10)
Glucose, Bld: 76 mg/dL (ref 70–99)
Potassium: 4.3 mEq/L (ref 3.5–5.3)
Sodium: 140 mEq/L (ref 135–145)
Total Bilirubin: 0.6 mg/dL (ref 0.3–1.2)
Total Protein: 6.9 g/dL (ref 6.0–8.3)

## 2012-02-11 LAB — LIPID PANEL
Cholesterol: 155 mg/dL (ref 0–200)
HDL: 36 mg/dL — ABNORMAL LOW (ref >39)
LDL Cholesterol: 96 mg/dL (ref 0–99)
Total CHOL/HDL Ratio: 4.3 Ratio
Triglycerides: 113 mg/dL (ref <150)
VLDL: 23 mg/dL (ref 0–40)

## 2012-02-12 ENCOUNTER — Encounter: Payer: Self-pay | Admitting: Internal Medicine

## 2012-04-27 ENCOUNTER — Other Ambulatory Visit: Payer: Self-pay | Admitting: Family Medicine

## 2012-04-27 DIAGNOSIS — K219 Gastro-esophageal reflux disease without esophagitis: Secondary | ICD-10-CM

## 2012-04-27 MED ORDER — OMEPRAZOLE 40 MG PO CPDR: 40.0000 mg | DELAYED_RELEASE_CAPSULE | Freq: Every day | ORAL | Status: DC

## 2012-07-08 ENCOUNTER — Encounter: Payer: Self-pay | Admitting: Internal Medicine

## 2012-09-13 ENCOUNTER — Other Ambulatory Visit: Payer: Self-pay | Admitting: Internal Medicine

## 2012-09-16 ENCOUNTER — Other Ambulatory Visit (INDEPENDENT_AMBULATORY_CARE_PROVIDER_SITE_OTHER): Payer: 59

## 2012-09-16 ENCOUNTER — Encounter: Payer: Self-pay | Admitting: Physician Assistant

## 2012-09-16 ENCOUNTER — Ambulatory Visit (INDEPENDENT_AMBULATORY_CARE_PROVIDER_SITE_OTHER)
Admission: RE | Admit: 2012-09-16 | Discharge: 2012-09-16 | Disposition: A | Discharge status: Home / Self Care | Payer: 59 | Source: Ambulatory Visit | Attending: Physician Assistant | Admitting: Physician Assistant

## 2012-09-16 ENCOUNTER — Ambulatory Visit (INDEPENDENT_AMBULATORY_CARE_PROVIDER_SITE_OTHER): Payer: 59 | Admitting: Physician Assistant

## 2012-09-16 ENCOUNTER — Telehealth: Payer: Self-pay | Admitting: Internal Medicine

## 2012-09-16 VITALS — BP 110/76 | HR 80 | Ht 67.0 in | Wt 282.0 lb

## 2012-09-16 DIAGNOSIS — K559 Vascular disorder of intestine, unspecified: Secondary | ICD-10-CM

## 2012-09-16 DIAGNOSIS — R109 Unspecified abdominal pain: Secondary | ICD-10-CM

## 2012-09-16 DIAGNOSIS — R52 Pain, unspecified: Secondary | ICD-10-CM

## 2012-09-16 DIAGNOSIS — K625 Hemorrhage of anus and rectum: Secondary | ICD-10-CM

## 2012-09-16 LAB — CBC WITH DIFFERENTIAL/PLATELET
Basophils Absolute: 0.1 10*3/uL (ref 0.0–0.1)
Basophils Relative: 1.1 % (ref 0.0–3.0)
Eosinophils Absolute: 0.2 10*3/uL (ref 0.0–0.7)
Eosinophils Relative: 2.9 % (ref 0.0–5.0)
HCT: 40.9 % (ref 36.0–46.0)
Hemoglobin: 13.7 g/dL (ref 12.0–15.0)
Lymphocytes Relative: 40.8 % (ref 12.0–46.0)
Lymphs Abs: 2.7 10*3/uL (ref 0.7–4.0)
MCHC: 33.4 g/dL (ref 30.0–36.0)
MCV: 88.7 fl (ref 78.0–100.0)
Monocytes Absolute: 0.5 10*3/uL (ref 0.1–1.0)
Monocytes Relative: 7.7 % (ref 3.0–12.0)
Neutro Abs: 3.2 10*3/uL (ref 1.4–7.7)
Neutrophils Relative %: 47.5 % (ref 43.0–77.0)
Platelets: 274 10*3/uL (ref 150.0–400.0)
RBC: 4.61 Mil/uL (ref 3.87–5.11)
RDW: 13.6 % (ref 11.5–14.6)
WBC: 6.7 10*3/uL (ref 4.5–10.5)

## 2012-09-16 MED ORDER — LINACLOTIDE 145 MCG PO CAPS: 145.0000 ug | ORAL_CAPSULE | Freq: Every day | ORAL | Status: DC

## 2012-09-16 MED ORDER — DICYCLOMINE HCL 10 MG PO CAPS
10.0000 mg | ORAL_CAPSULE | Freq: Three times a day (TID) | ORAL | Status: DC | PRN
Start: 2012-09-16 — End: 2014-04-23

## 2012-09-16 MED ORDER — IOHEXOL 300 MG/ML  SOLN
100.0000 mL | Freq: Once | INTRAMUSCULAR | Status: AC | PRN
  Administered 2012-09-16: 100 mL via INTRAVENOUS

## 2012-09-16 NOTE — Telephone Encounter (Signed)
Patient report severe lower abdominal cramping last night and a large amount of bright red blood.  She was passing blood independent of stool last night, but this am she is only having cramping.  She does have a history with Dr. Leone Payor in 08, but at this time I don't have access to those records.  She will come in and see Amy Esterwood PA this am at 11:00

## 2012-09-16 NOTE — Progress Notes (Addendum)
Subjective:    Patient ID: Erin Jimenez, female    DOB: 1971-11-04, 41 y.o.   MRN: [id number]  HPI  Erin Jimenez is a pleasant 41 year old white female known remotely to Dr. Leone Payor. She says she was seen in 2008 for constipation and was treated with Amitiza at that time. Very remotely she had had an ERCP with Dr. Marina Goodell in 2000 while  she was hospitalized and had stone extraction. The patient has history of obesity depression endometriosis, chronic constipation, and an acquired pseudo-meningocele area she also relates that she has a Chiari  Malformation She says the [place] did work for her constipation however her insurance would not pay for associated not stay on a long-term. She says she still suffers from constipation he usually has one or 2 bowel movements per week.  She comes in today as an acute add-on with complaints of onset of severe lower abdcramping and pain late yesterday afternoon followed by nausea a normal small bowel movement followed by several episodes of passage of blood and clots. Says she felt miserable last evening but has not had any further bowel movements or bleeding today. She is tired still feels nauseated but was able to keep down some by mouth and has ongoing abdominal pain. Has not had any fever or chills. No recent antibiotic new medications or changes in medication dosages. She also mentions that she's been having trouble with dysphagia and that this is been going on for several years. He says now she is almost to the point of having daily episodes of regurgitation she says the food just seems to get stuck and doesn't make it to her stomach. When she regurgitates she can usually resumed eating and seems to be able to get food to go down at that point the She is not having any ongoing problems with heartburn or indigestion.    Review of Systems  Constitutional: Negative.   HENT: Positive for trouble swallowing.   Eyes: Negative.   Respiratory: Negative.   Cardiovascular:  Negative.   Gastrointestinal: Positive for abdominal pain and blood in stool.  Genitourinary: Negative.   Musculoskeletal: Negative.   Skin: Negative.   Neurological: Positive for numbness.  Hematological: Negative.   Psychiatric/Behavioral: Negative.    Outpatient Prescriptions Prior to Visit  Medication Sig Dispense Refill  . BuPROPion HCl (WELLBUTRIN PO) Take 150 tablets by mouth daily.       . cyanocobalamin (,VITAMIN B-12,) 1000 MCG/ML injection Inject 1 mL (1,000 mcg total) into the muscle every 30 (thirty) days. Include needles(short/insulin type)  1 mL  11  . Est Estrogens-Methyltest (ESTRATEST PO) Take by mouth.        . fluticasone (FLONASE) 50 MCG/ACT nasal spray Place 2 sprays into the nose daily.  16 g  6  . HYDROCODONE-ACETAMINOPHEN PO Take by mouth.        . Loratadine (CLARITIN PO) Take 10 tablets by mouth daily.       Marland Kitchen zolpidem (AMBIEN) 10 MG tablet TAKE 1 TABLET BY MOUTH AT BEDTIME AS NEEDED FOR SLEEP  30 tablet  0  . Cyanocobalamin (VITAMIN B-12 IJ) Inject as directed.        Marland Kitchen omeprazole (PRILOSEC) 40 MG capsule Take 1 capsule (40 mg total) by mouth daily.  90 capsule  2  . Zolpidem Tartrate (AMBIEN PO) Take 10 tablets by mouth at bedtime as needed.            Allergies  Allergen Reactions  . Ciprofloxacin Nausea Only  .  Doxycycline Nausea Only  . Oxycodone Nausea Only  . Sulfa Antibiotics Nausea Only   Patient Active Problem List  Diagnosis  . OBESITY, UNSPECIFIED  . DEPRESSION  . CONSTIPATION  . IRRITABLE BOWEL SYNDROME  . ENDOMETRIOSIS  . Pseudomeningocele, acquired  . GERD (gastroesophageal reflux disease)  . Allergic rhinitis due to pollen  . Insomnia  . B12 deficiency  . BMI 40.0-44.9, adult   History   Social History  . Marital Status: Married    Spouse Name: N/A    Number of Children: N/A  . 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 Smoking status: Never Smoker   .  Smokeless tobacco: Never Used  . Alcohol Use: Yes     occ  . Drug Use: No  . Sexually Active: Not Currently   Other Topics Concern  . Not on file   Social History Narrative  . No narrative on file   Objective:   Physical Exam obese white female in no acute distress very pleasant blood pressure 110/76 pulse 80 height 5 foot 7 weight 282. HEENT; nontraumatic normocephalic EOMI PERRLA sclera anicteric, Neck; patient unable to turn her neck more than about 45 to the right no JVD, Cardiovascular; regular rate and rhythm with S1-S2 no murmur or gallop, Pulmonary; clear bilaterally, Abdomen; large soft bowel sounds are active she is tender in the left lower quadrant left mid quadrant and across the suprapubic area there is no guarding no rebound no palpable mass or hepatosplenomegaly, Rectal; exam not done, Extremities; no clubbing cyanosis or edema skin warm and dry, Psych; mood and affect normal and appropriate.        Assessment & Plan:  #75 41 year old female with abrupt onset of lower abdominal pain nausea all of by bloody stool yesterday afternoon. Her course is most consistent with a segmental ischemic colitis. Upper bleeding has resolved at this time, trigger is not clear #2 chronic constipation #3 solid food dysphagia chronic and slowly progressive. Patient has not had a prior EGD and most likely has an esophageal stricture #4 obesity #5 Hx pseudomeningocele and Chiari  Malformation.  Plan; Long discussion with patient regarding natural course of segmental ischemic colitis, will obtain CBC today Schedule for CT scan of the abdomen and pelvis this afternoon Patient is asked to push liquids and try frequent small bland feedings We'll start Bentyl 10 mg 3 times daily as needed for cramping Home to rest this weekend Will also give her a trial of Linzess145 mcg daily for her chronic constipation, samples were given today but she is asked not to start this over the next week until she gets  over her acute illness. Followup office visit with myself or Dr. Leone Payor in the next 2-3 weeks, and then will need to schedule for EGD with dilation.  Addendum: Reviewed and agree with initial management. Await CT finding, to discuss colonoscopy in follow-up as well. Beverley Fiedler, MD

## 2012-09-16 NOTE — Patient Instructions (Addendum)
We have given you samples of the following medication to take:Linzess 145 mcg. Please take one by mouth once daily. Do not start Linzess until next week after current symptoms have resolved.  We have sent the following medications to your pharmacy for you to pick up at your convenience Bentyl  Your physician has requested that you go to the basement for  lab work before leaving today:   You have been scheduled for a CT scan of the abdomen and pelvis at [facility] ([address]---this is in the same building as Architectural technologist).   You are scheduled on 09-16-2012 at 2:15 You should arrive 15 minutes prior to your appointment time for registration. Please follow the written instructions below on the day of your exam:  WARNING: IF YOU ARE ALLERGIC TO IODINE/X-RAY DYE, PLEASE NOTIFY RADIOLOGY IMMEDIATELY AT [phone]! YOU WILL BE GIVEN A 13 HOUR PREMEDICATION PREP.  1) Do not eat or drink anything after NOW (4 hours prior to your test) 2) You have been given 2 bottles of oral contrast to drink. The solution may taste better if refrigerated, but do NOT add ice or any other liquid to this solution. Shake well before drinking.    Drink 1 bottle of contrast @ 12:15 pm (2 hours prior to your exam)  Drink 1 bottle of contrast @ 1:15pm  (1 hour prior to your exam)  You may take any medications as prescribed with a small amount of water except for the following: Metformin, Glucophage, Glucovance, Avandamet, Riomet, Fortamet, Actoplus Met, Janumet, Glumetza or Metaglip. The above medications must be held the day of the exam AND 48 hours after the exam.  The purpose of you drinking the oral contrast is to aid in the visualization of your intestinal tract. The contrast solution may cause some diarrhea. Before your exam is started, you will be given a small amount of fluid to drink. Depending on your individual set of symptoms, you may also receive an intravenous injection of x-ray  contrast/dye. Plan on being at [facility] for 30 minutes or long, depending on the type of exam you are having performed.  If you have any questions regarding your exam or if you need to reschedule, you may call the CT department at [phone] between the hours of 8:00 am and 5:00 pm, Monday-Friday.  ______________________________________________________________  Your follow up appointment is 09-30-2012 at 8:30am with Mike Gip, PAC

## 2012-09-19 ENCOUNTER — Telehealth: Payer: Self-pay | Admitting: Physician Assistant

## 2012-09-19 NOTE — Telephone Encounter (Signed)
Ok, not unexpected- she should use the Bentyl as needed for cramping. Give it until the end of this week for sxs to resolve- if still hurting next week ,call back and will see in office

## 2012-09-19 NOTE — Telephone Encounter (Signed)
Patient given Mike Gip, PA  Recommendations. She will call back next week if still cramping.

## 2012-09-19 NOTE — Telephone Encounter (Signed)
Patient given results. She is still having cramping when eats and when has bowel movement it cramps afterwards. No bleeding. Bowel movements are more normal.

## 2012-09-19 NOTE — Telephone Encounter (Signed)
Please call pt, and let her know her Ct scan was normal- she likely had a milder ischemic colitis which may not show up on Ct- hopefully she is feeling better

## 2012-09-29 ENCOUNTER — Telehealth: Payer: Self-pay | Admitting: Physician Assistant

## 2012-09-29 ENCOUNTER — Other Ambulatory Visit: Payer: Self-pay | Admitting: *Deleted

## 2012-09-29 MED ORDER — LINACLOTIDE 145 MCG PO CAPS: 145.0000 ug | ORAL_CAPSULE | Freq: Every day | ORAL | Status: DC

## 2012-09-29 NOTE — Telephone Encounter (Signed)
Called the patient and confirmed that the Linzess is working well for her.  I sent a prescription to [facility] [address] per her request .  I also put a Linzess savings card at the front desk for her.  She can get 1 month supply free with commercial insurance.

## 2012-09-30 ENCOUNTER — Ambulatory Visit: Payer: 59 | Admitting: Physician Assistant

## 2012-11-21 ENCOUNTER — Telehealth: Payer: Self-pay | Admitting: *Deleted

## 2012-11-21 MED ORDER — ZOLPIDEM TARTRATE 10 MG PO TABS: 10.0000 mg | ORAL_TABLET | Freq: Every evening | ORAL | Status: DC | PRN

## 2012-11-21 NOTE — Telephone Encounter (Signed)
Meds ordered this encounter  Medications  . zolpidem (AMBIEN) 10 MG tablet    Sig: Take 1 tablet (10 mg total) by mouth at bedtime as needed for sleep.    Dispense:  30 tablet    Refill:  4

## 2012-11-21 NOTE — Telephone Encounter (Signed)
Pharmacy requesting refill on Ambien 10mg

## 2012-11-22 NOTE — Telephone Encounter (Signed)
rx called in

## 2012-12-08 ENCOUNTER — Ambulatory Visit: Payer: 59 | Admitting: Family Medicine

## 2012-12-08 VITALS — BP 140/85 | HR 84 | Temp 98.2°F | Resp 16 | Ht 67.5 in | Wt 282.2 lb

## 2012-12-08 DIAGNOSIS — J329 Chronic sinusitis, unspecified: Secondary | ICD-10-CM

## 2012-12-08 DIAGNOSIS — IMO0002 Reserved for concepts with insufficient information to code with codable children: Secondary | ICD-10-CM

## 2012-12-08 MED ORDER — LEVOFLOXACIN 500 MG PO TABS: 500.0000 mg | ORAL_TABLET | Freq: Every day | ORAL | Status: DC

## 2012-12-08 NOTE — Progress Notes (Signed)
Patient ID: Erin Jimenez MRN: [id number], DOB: 03/30/1971, 41 y.o. Date of Encounter: 12/08/2012, 11:44 AM  Primary Physician: Tonye Pearson, MD  Chief Complaint:  Chief Complaint  Patient presents with 41  . Sinusitis  . Sore Throat  . chest congestion    HPI: 41 y.o. year old female presents with 3 day history of nasal congestion, post nasal drip, sore throat, sinus pressure, and cough. Afebrile. No chills. Nasal congestion thick and green/yellow. Sinus pressure is the worst symptom. Cough is productive secondary to post nasal drip and not associated with time of day. Ears feel full, leading to sensation of muffled hearing. Has tried OTC cold preps without success. No GI complaints.   No recent antibiotics, recent travels, or sick contacts   No leg trauma, sedentary periods, h/o cancer, or tobacco use.  Past Medical History  Diagnosis Date  . Irritable bowel syndrome   . Chronic headache   . Chiari malformation      Home Meds: Prior to Admission medications   Medication Sig Start Date End Date Taking? Authorizing Provider  buPROPion (WELLBUTRIN XL) 150 MG 24 hr tablet Take 150 mg by mouth daily.   Yes Historical Provider, MD  cyclobenzaprine (FLEXERIL) 10 MG tablet Take 10 mg by mouth at bedtime.   Yes Historical Provider, MD  Est Estrogens-Methyltest (ESTRATEST PO) Take by mouth.     Yes Historical Provider, MD  HYDROCODONE-ACETAMINOPHEN PO Take by mouth.     Yes Historical Provider, MD  Loratadine (CLARITIN PO) Take 10 tablets by mouth daily.    Yes Historical Provider, MD  cyanocobalamin (,VITAMIN B-12,) 1000 MCG/ML injection Inject 1 mL (1,000 mcg total) into the muscle every 30 (thirty) days. Include needles(short/insulin type) 02/10/12 02/09/13  Tonye Pearson, MD  dicyclomine (BENTYL) 10 MG capsule Take 1 capsule (10 mg total) by mouth 3 (three) times daily as needed (For cramping ). 09/16/12   Amy S Esterwood, PA  fluticasone (FLONASE) 50 MCG/ACT nasal spray  Place 2 sprays into the nose daily. 02/10/12 02/09/13  Tonye Pearson, MD  Linaclotide [facility]) 145 MCG CAPS Take 1 capsule (145 mcg total) by mouth daily. 09/29/12   Amy S Esterwood, PA  zolpidem (AMBIEN) 10 MG tablet Take 1 tablet (10 mg total) by mouth at bedtime as needed for sleep. 11/21/12   Tonye Pearson, MD    Allergies:  Allergies  Allergen Reactions  . Ciprofloxacin Nausea Only  . Doxycycline Nausea Only  . Oxycodone Nausea Only  . Sulfa Antibiotics Nausea Only    History   Social History  . Marital Status: Married    Spouse Name: N/A    Number of Children: N/A  . 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 Smoking status: Never Smoker   . Smokeless tobacco: Never Used  . Alcohol Use: Yes     Comment: occ  . Drug Use: No  . Sexually Active: Not Currently   Other Topics Concern  . Not on file   Social History Narrative  . No narrative on file     Review of Systems: Constitutional: negative for chills, fever, night sweats or weight changes Cardiovascular: negative for chest pain or palpitations Respiratory: negative for hemoptysis, wheezing, or shortness of breath Abdominal: negative for abdominal pain, nausea, vomiting or diarrhea Dermatological: negative for rash Neurologic: negative for headache   Physical Exam: Blood pressure 140/85, pulse 84, temperature 98.2  F (36.8 C), temperature source Oral, resp. rate 16, height 5' 7.5" (1.715 m), weight 282 lb 3.2 oz (128.005 kg), SpO2 96.00%., Body mass index is 43.55 kg/(m^2). General: Well developed, well nourished, in no acute distress. Head: Normocephalic, atraumatic, eyes without discharge, sclera non-icteric, nares are congested. Bilateral auditory canals clear, TM's are without perforation, pearly grey with reflective cone of light bilaterally. Serous effusion bilaterally behind TM's. Maxillary sinus TTP. Oral cavity moist, dentition  normal. Posterior pharynx with post nasal drip and mild erythema. No peritonsillar abscess or tonsillar exudate. Neck: Supple. No thyromegaly, partial ROM (rotates only 45 degrees each way). No lymphadenopathy.  Very swollen tissue bilateral post. Cervical paraspinal tissues Lungs: Clear bilaterally to auscultation without wheezes, rales, or rhonchi. Breathing is unlabored.  Heart: RRR with S1 S2. No murmurs, rubs, or gallops appreciated. Msk:  Strength and tone normal for age. Extremities: No clubbing or cyanosis. No edema. Neuro: Alert and oriented X 3. Moves all extremities spontaneously. CNII-XII grossly in tact. Psych:  Responds to questions appropriately with a normal affect.   Labs:   ASSESSMENT AND PLAN:  41 y.o. year old female with sinusitis -  -Tylenol/Motrin prn -Rest/fluids -RTC precautions -RTC 3-5 days if no improvement  Signed, Elvina Sidle, MD 12/08/2012 11:44 AM

## 2012-12-08 NOTE — Patient Instructions (Addendum)

## 2012-12-09 ENCOUNTER — Telehealth: Payer: Self-pay | Admitting: *Deleted

## 2012-12-09 NOTE — Telephone Encounter (Signed)
Left message for patient to call back  

## 2012-12-09 NOTE — Telephone Encounter (Signed)
Patient is scheduled to see Dr. Leone Payor to evaluate dysphagia 01/10/13

## 2013-01-10 ENCOUNTER — Encounter: Payer: Self-pay | Admitting: Internal Medicine

## 2013-01-10 ENCOUNTER — Ambulatory Visit (INDEPENDENT_AMBULATORY_CARE_PROVIDER_SITE_OTHER): Payer: 59 | Admitting: Internal Medicine

## 2013-01-10 VITALS — BP 108/74 | HR 76 | Ht 65.0 in | Wt 283.0 lb

## 2013-01-10 DIAGNOSIS — K921 Melena: Secondary | ICD-10-CM

## 2013-01-10 DIAGNOSIS — R1310 Dysphagia, unspecified: Secondary | ICD-10-CM | POA: Insufficient documentation

## 2013-01-10 DIAGNOSIS — K589 Irritable bowel syndrome without diarrhea: Secondary | ICD-10-CM

## 2013-01-10 MED ORDER — LINACLOTIDE 145 MCG PO CAPS: 145.0000 ug | ORAL_CAPSULE | Freq: Every day | ORAL | Status: DC

## 2013-01-10 NOTE — Patient Instructions (Addendum)
You have been scheduled for an upper GI with tablet at [facility] (1st floor of the hospital) on 01/11/13 at 10:30am. Please arrive 15 minutes prior to your appointment for registration. Make certain not to have anything to eat or drink 6 hours prior to your test. If you need to reschedule for any reason, please contact radiology at [phone] to do so. __________________________________________________________________ A barium swallow is an examination that concentrates on views of the esophagus. This tends to be a double contrast exam (barium and two liquids which, when combined, create a gas to distend the wall of the oesophagus) or single contrast (non-ionic iodine based). The study is usually tailored to your symptoms so a good history is essential. Attention is paid during the study to the form, structure and configuration of the esophagus, looking for functional disorders (such as aspiration, dysphagia, achalasia, motility and reflux) EXAMINATION You may be asked to change into a gown, depending on the type of swallow being performed. A radiologist and radiographer will perform the procedure. The radiologist will advise you of the type of contrast selected for your procedure and direct you during the exam. You will be asked to stand, sit or lie in several different positions and to hold a small amount of fluid in your mouth before being asked to swallow while the imaging is performed .In some instances you may be asked to swallow barium coated marshmallows to assess the motility of a solid food bolus. The exam can be recorded as a digital or video fluoroscopy procedure. POST PROCEDURE It will take 1-2 days for the barium to pass through your system. To facilitate this, it is important, unless otherwise directed, to increase your fluids for the next 24-48hrs and to resume your normal diet.  This test typically takes about 30 minutes to perform.  You have been given a printed rx today for  Linzess .  We have also provided you with a coupon.  Thank you for choosing me and [facility].  Iva Boop, M.D., [facility]  __________________________________________________________________________________

## 2013-01-10 NOTE — Progress Notes (Signed)
Subjective:    Patient ID: Erin Jimenez, female    DOB: 15-Jun-1971, 42 y.o.   MRN: [id number]  HPI The patient presents c/o dysphagia to solids > liquids. She had a laparoscopic gastric band placed in 2006. Dysphagia since then, even after total removal of fluid. UGI series with band in late 2006 shows esophageal dysmotility and 2005 UGI prior to lap band also showed that though she says no dysphagia then. She has daily problems and regurgitates food/liquids. Occasional heartburn.  In 09/2012 she presented with acute LLQ pain and hematochezia, NL Hgb, negative CT abd/pelvis then. Working dx by Ms. Erin Becton PA-C was ischemic colitis. She started Linzess 145 mcg daily and has had a great response and no recurrence and only rare abdominal cramps helped by antispasmodic.  She has gained weight after initially losing #70 with lap band but lost confidence in Dr. Johna Jimenez because she indicated she could not maintain the diet and had to switch to liquids with sugars, etc due to the dysphagia - she reports he was not understanding. She does not want to return to him.  Allergies  Allergen Reactions  . Ciprofloxacin Nausea Only  . Doxycycline Nausea Only  . Oxycodone Nausea Only  . Sulfa Antibiotics Nausea Only   Outpatient Prescriptions Prior to Visit  Medication Sig Dispense Refill  . buPROPion (WELLBUTRIN XL) 150 MG 24 hr tablet Take 150 mg by mouth daily.      . cyanocobalamin (,VITAMIN B-12,) 1000 MCG/ML injection Inject 1 mL (1,000 mcg total) into the muscle every 30 (thirty) days. Include needles(short/insulin type)  1 mL  11  . cyclobenzaprine (FLEXERIL) 10 MG tablet Take 10 mg by mouth at bedtime.      . dicyclomine (BENTYL) 10 MG capsule Take 1 capsule (10 mg total) by mouth 3 (three) times daily as needed (For cramping ).  30 capsule  0  . Est Estrogens-Methyltest (ESTRATEST PO) Take by mouth.        . fluticasone (FLONASE) 50 MCG/ACT nasal spray Place 2 sprays into the nose daily.  16 g   6  . HYDROCODONE-ACETAMINOPHEN PO Take by mouth.        . Loratadine (CLARITIN PO) Take 10 tablets by mouth daily.       Marland Kitchen zolpidem (AMBIEN) 10 MG tablet Take 1 tablet (10 mg total) by mouth at bedtime as needed for sleep.  30 tablet  4  . [DISCONTINUED] Linaclotide (LINZESS) 145 MCG CAPS Take 1 capsule (145 mcg total) by mouth daily.  30 capsule  3  . [DISCONTINUED] levofloxacin (LEVAQUIN) 500 MG tablet Take 1 tablet (500 mg total) by mouth daily.  7 tablet  0   Last reviewed on 01/10/2013  8:50 AM by Erin Jimenez, CMA Past Medical History  Diagnosis Date  . Irritable bowel syndrome   . Chronic headache   . Chiari malformation   . GERD (gastroesophageal reflux disease)   . Endometriosis   . Obesity   . Depression   . B12 deficiency   . Insomnia   . Pseudomeningocele, acquired    Past Surgical History  Procedure Date  . Abdominal hysterectomy 2001  . Laparoscopic gastric banding 2006  . Spinal fusion   . Cholecystectomy   . Brain surgery     Chiari malformation  . Ercp 2000   History   Social History  . Marital Status: Married                 Occupational History  .  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 Smoking status: Never Smoker   . Smokeless tobacco: Never Used  . Alcohol Use: Yes     Comment: occ  . Drug Use: No  . Sexually Active: Not Currently          Social History Narrative   MarriedSales associate [employer]   Family History  Problem Relation Age of Onset  . Colon polyps Mother   . Diabetes Mother   . Lymphoma Mother     CNS  . Colon polyps Father   . Lymphoma Father         Review of Systems As above    Objective:   Physical Exam General:  NAD, obese Eyes:   anicteric Lungs:  clear Heart:  S1S2 no rubs, murmurs or gallops Abdomen:  soft and nontender, BS+, lap scares present, band device somewhat papable in LUQ  Data Reviewed:  UGIS images viewed (2005 and 2006) - showed to patient 09/2012 GI note, labs and  CT      Assessment & Plan:   1. Dysphagia  DG UGI W/High Density W/Kub -may need manometry (dysmotility), EGD, removal of lap band  2. IBS (irritable bowel syndrome) constipation predominant Continue Linzess (lower dose is working well), dicyclomine  3. Hematochezia  Was likely ischemic colitis, still need to consider colonoscopy, she is willing, await UGIS   Will call w/ UGIS results and plans  AV:[id number], Erin Lemon, MD

## 2013-01-11 ENCOUNTER — Ambulatory Visit (HOSPITAL_COMMUNITY)
Admission: RE | Admit: 2013-01-11 | Discharge: 2013-01-11 | Disposition: A | Discharge status: Home / Self Care | Payer: 59 | Source: Ambulatory Visit | Attending: Internal Medicine | Admitting: Internal Medicine

## 2013-01-11 DIAGNOSIS — K921 Melena: Secondary | ICD-10-CM

## 2013-01-11 DIAGNOSIS — Z9884 Bariatric surgery status: Secondary | ICD-10-CM | POA: Insufficient documentation

## 2013-01-11 DIAGNOSIS — R1310 Dysphagia, unspecified: Secondary | ICD-10-CM | POA: Insufficient documentation

## 2013-01-11 DIAGNOSIS — K449 Diaphragmatic hernia without obstruction or gangrene: Secondary | ICD-10-CM | POA: Insufficient documentation

## 2013-01-11 DIAGNOSIS — K589 Irritable bowel syndrome without diarrhea: Secondary | ICD-10-CM

## 2013-01-11 NOTE — Progress Notes (Signed)
Quick Note:  This study did not show a problem Next step EGD - dysphagia Colonoscopy - hematochezia ______

## 2013-01-12 ENCOUNTER — Ambulatory Visit (AMBULATORY_SURGERY_CENTER): Payer: 59 | Admitting: *Deleted

## 2013-01-12 VITALS — Ht 65.0 in | Wt 282.0 lb

## 2013-01-12 DIAGNOSIS — K921 Melena: Secondary | ICD-10-CM

## 2013-01-12 DIAGNOSIS — R1310 Dysphagia, unspecified: Secondary | ICD-10-CM

## 2013-01-12 MED ORDER — NA SULFATE-K SULFATE-MG SULF 17.5-3.13-1.6 GM/177ML PO SOLN: ORAL | Status: DC

## 2013-01-13 ENCOUNTER — Encounter: Payer: Self-pay | Admitting: Internal Medicine

## 2013-01-24 ENCOUNTER — Telehealth: Payer: Self-pay | Admitting: Internal Medicine

## 2013-01-24 NOTE — Telephone Encounter (Signed)
No charge. 

## 2013-01-26 ENCOUNTER — Encounter: Payer: 59 | Admitting: Internal Medicine

## 2013-03-12 ENCOUNTER — Other Ambulatory Visit: Payer: Self-pay | Admitting: Internal Medicine

## 2013-03-15 ENCOUNTER — Encounter: Payer: Self-pay | Admitting: *Deleted

## 2013-03-15 ENCOUNTER — Encounter: Payer: Self-pay | Admitting: Internal Medicine

## 2013-03-15 ENCOUNTER — Ambulatory Visit (AMBULATORY_SURGERY_CENTER): Payer: 59 | Admitting: Internal Medicine

## 2013-03-15 VITALS — BP 132/76 | HR 71 | Temp 97.6°F | Resp 17 | Ht 65.0 in | Wt 282.0 lb

## 2013-03-15 DIAGNOSIS — K219 Gastro-esophageal reflux disease without esophagitis: Secondary | ICD-10-CM

## 2013-03-15 DIAGNOSIS — R1310 Dysphagia, unspecified: Secondary | ICD-10-CM

## 2013-03-15 DIAGNOSIS — K921 Melena: Secondary | ICD-10-CM

## 2013-03-15 MED ORDER — SODIUM CHLORIDE 0.9 % IV SOLN: 500.0000 mL | INTRAVENOUS | Status: DC

## 2013-03-15 MED ORDER — PANTOPRAZOLE SODIUM 40 MG PO TBEC: 40.0000 mg | DELAYED_RELEASE_TABLET | Freq: Every day | ORAL | Status: DC

## 2013-03-15 NOTE — Progress Notes (Signed)
Lidocaine-40mg IV prior to Propofol InductionPropofol given over incremental dosages 

## 2013-03-15 NOTE — Op Note (Addendum)
[facility] [address] [place], [address]   ENDOSCOPY PROCEDURE REPORT  PATIENT: Erin Jimenez, Erin Jimenez  MR#: [id number] BIRTHDATE: 06/06/1971 , 42  yrs. old GENDER: Female ENDOSCOPIST: Iva Boop, MD, [facility] Graham REFERRED BY:  Ellamae Sia, M.D. PROCEDURE DATE:  03/15/2013 PROCEDURE:  EGD, diagnostic and Maloney dilation of esophagus ASA CLASS:     Class II INDICATIONS:  Dysphagia. MEDICATIONS: propofol (Diprivan) 150mg  IV, MAC sedation, administered by CRNA, and These medications were titrated to patient response per physician's verbal order TOPICAL ANESTHETIC: none  DESCRIPTION OF PROCEDURE: After the risks benefits and alternatives of the procedure were thoroughly explained, informed consent was obtained.  The LB GIF-H180 [id number] endoscope was introduced through the mouth and advanced to the second portion of the duodenum. Without limitations.  The instrument was slowly withdrawn as the mucosa was fully examined.      The upper, middle and distal third of the esophagus were carefully inspected and no abnormalities were noted.  The z-line was well seen at the GEJ.  The endoscope was pushed into the fundus which was normal including a retroflexed view.  The antrum, gastric body, first and second part of the duodenum were unremarkable. Retroflexed views revealed no abnormalities.     The scope was then withdrawn from the patient, a 50 [place] Maloney dilator passed without difficulty or heme, and the procedure completed.  COMPLICATIONS: There were no complications. ENDOSCOPIC IMPRESSION: Normal EGD - no effect of indwelling lap band seen. 54 [place] Maloney dilation performed due to dysphagia.  RECOMMENDATIONS: 1.  Clear liquids until 11 AM, then soft foods rest of day.  Resume prior diet tomorrow. 2.  PPI qam - start pantoprazole - I thing GERD part of problem. 3.   See Dr.  Leone Payor in the office late May or early June. Call to schedule.   eSigned:   Iva Boop, MD, [facility] 03/15/2013 10:08 AM   XB:JYNWGN Merla Riches, MD and The Patient

## 2013-03-15 NOTE — Progress Notes (Signed)
Patient did not experience any of the following events: a burn prior to discharge; a fall within the facility; wrong site/side/patient/procedure/implant event; or a hospital transfer or hospital admission upon discharge from the facility. (G8907) Patient did not have preoperative order for IV antibiotic SSI prophylaxis. (G8918)  

## 2013-03-15 NOTE — Progress Notes (Signed)
Called to room to assist during endoscopic procedure.  Patient ID and intended procedure confirmed with present staff. Received instructions for my participation in the procedure from the performing physician.  

## 2013-03-15 NOTE — Progress Notes (Signed)
NO EGG OR SOY ALLERGY. EWM 

## 2013-03-15 NOTE — Op Note (Signed)
[facility] [address] [place], [address]   COLONOSCOPY PROCEDURE REPORT  PATIENT: Erin Jimenez, Erin Jimenez  MR#: [id number] BIRTHDATE: 02-06-1971 , 42  yrs. old GENDER: Female ENDOSCOPIST: Iva Boop, MD, [facility] REFERRED XB:JYNWGN Merla Riches, M.D. PROCEDURE DATE:  03/15/2013 PROCEDURE:   Colonoscopy, diagnostic ASA CLASS:   Class II INDICATIONS:hematochezia. MEDICATIONS: There was residual sedation effect present from prior procedure, MAC sedation, administered by CRNA, These medications were titrated to patient response per physician's verbal order, and propofol (Diprivan) 250mg  IV  DESCRIPTION OF PROCEDURE:   After the risks benefits and alternatives of the procedure were thoroughly explained, informed consent was obtained.  A digital rectal exam revealed no abnormalities of the rectum.   The LB CF-H180AL [id number]  endoscope was introduced through the anus and advanced to the cecum, which was identified by both the appendix and ileocecal valve. No adverse events experienced.   The quality of the prep was Suprep excellent The instrument was then slowly withdrawn as the colon was fully examined.      COLON FINDINGS: A normal appearing cecum, ileocecal valve, and appendiceal orifice were identified.  The ascending, hepatic flexure, transverse, splenic flexure, descending, sigmoid colon and rectum appeared unremarkable.  No polyps or cancers were seen. Retroflexed views revealed no abnormalities. The time to cecum=5 minutes 56 seconds.  Withdrawal time=8 minutes 55 seconds.  The scope was withdrawn and the procedure completed. COMPLICATIONS: There were no complications.  ENDOSCOPIC IMPRESSION: Normal colonoscopy, excellent prep. this supports prior diagnosis of transient ischemic colitis.  RECOMMENDATIONS: Repeat Colonscopy in 10 years for screening   eSigned:  Iva Boop, MD, [facility] 03/15/2013 10:13 AM   cc: Ellamae Sia, MD and The  Patient

## 2013-03-15 NOTE — Patient Instructions (Addendum)
The upper GI endoscopy looked normal. I did dilate the esophagus to see if that helps your swallowing. Please follow the restricted diet today.  I also prescribed pantoprazole 40 mg - I want you to take this every AM - I think reflux is part of your problem and this will help with that.  The colonoscopy was normal - great prep. Next routine colonoscopy in 2024 (10 years).  Please call the office by next week and schedule a follow-up with me in late May or early June. Call back sooner if you are having difficulty.  Thank you for choosing me and [facility].  Iva Boop, MD, FACG  YOU HAD AN ENDOSCOPIC PROCEDURE TODAY AT THE [facility]: Refer to the procedure report that was given to you for any specific questions about what was found during the examination.  If the procedure report does not answer your questions, please call your gastroenterologist to clarify.  If you requested that your care partner not be given the details of your procedure findings, then the procedure report has been included in a sealed envelope for you to review at your convenience later.  YOU SHOULD EXPECT: Some feelings of bloating in the abdomen. Passage of more gas than usual.  Walking can help get rid of the air that was put into your GI tract during the procedure and reduce the bloating. If you had a lower endoscopy (such as a colonoscopy or flexible sigmoidoscopy) you may notice spotting of blood in your stool or on the toilet paper. If you underwent a bowel prep for your procedure, then you may not have a normal bowel movement for a few days.  DIET:  Nothing to eat or drink until 11:00. 11:00 until 12:00 only clear liquids. After 12 noon today on soft foods.  Resume your diet in am.  ACTIVITY: Your care partner should take you home directly after the procedure.  You should plan to take it easy, moving slowly for the rest of the day.  You can resume normal activity the day after the  procedure however you should NOT DRIVE or use heavy machinery for 24 hours (because of the sedation medicines used during the test).    SYMPTOMS TO REPORT IMMEDIATELY: A gastroenterologist can be reached at any hour.  During normal business hours, 8:30 AM to 5:00 PM Monday through Friday, call [phone].  After hours and on weekends, please call the GI answering service at [phone] who will take a message and have the physician on call contact you.   Following lower endoscopy (colonoscopy or flexible sigmoidoscopy):  Excessive amounts of blood in the stool  Significant tenderness or worsening of abdominal pains  Swelling of the abdomen that is new, acute  Fever of 100F or higher  Following upper endoscopy (EGD)  Vomiting of blood or coffee ground material  New chest pain or pain under the shoulder blades  Painful or persistently difficult swallowing  New shortness of breath  Fever of 100F or higher  Black, tarry-looking stools  FOLLOW UP: If any biopsies were taken you will be contacted by phone or by letter within the next 1-3 weeks.  Call your gastroenterologist if you have not heard about the biopsies in 3 weeks.  Our staff will call the home number listed on your records the next business day following your procedure to check on you and address any questions or concerns that you may have at that time regarding the information given to you  following your procedure. This is a courtesy call and so if there is no answer at the home number and we have not heard from you through the emergency physician on call, we will assume that you have returned to your regular daily activities without incident.  SIGNATURES/CONFIDENTIALITY: You and/or your care partner have signed paperwork which will be entered into your electronic medical record.  These signatures attest to the fact that that the information above on your After Visit Summary has been reviewed and is understood.  Full  responsibility of the confidentiality of this discharge information lies with you and/or your care-partner.

## 2013-03-16 ENCOUNTER — Telehealth: Payer: Self-pay | Admitting: *Deleted

## 2013-03-16 NOTE — Telephone Encounter (Signed)
  Follow up Call-  Call back number 03/15/2013  Post procedure Call Back phone  # [phone]  Permission to leave phone message Yes     Patient questions:  Do you have a fever, pain , or abdominal swelling? no Pain Score  0 *  Have you tolerated food without any problems? yes  Have you been able to return to your normal activities? yes  Do you have any questions about your discharge instructions: Diet   no Medications  no Follow up visit  no  Do you have questions or concerns about your Care? no  Actions: * If pain score is 4 or above: No action needed, pain <4.

## 2013-04-13 ENCOUNTER — Other Ambulatory Visit: Payer: Self-pay

## 2013-04-13 MED ORDER — CYANOCOBALAMIN 1000 MCG/ML IJ SOLN: INTRAMUSCULAR | Status: DC

## 2013-04-13 NOTE — Telephone Encounter (Signed)
Pt has scheduled a CPE with Dr. Merla Riches for June 11 (first avail). She will run 2 months short on her B12 and wants to know if we can refill.    [facility] [address] PT [facility] [phone]

## 2013-05-09 ENCOUNTER — Other Ambulatory Visit: Payer: Self-pay

## 2013-05-09 DIAGNOSIS — K219 Gastro-esophageal reflux disease without esophagitis: Secondary | ICD-10-CM

## 2013-05-09 MED ORDER — PANTOPRAZOLE SODIUM 40 MG PO TBEC: 40.0000 mg | DELAYED_RELEASE_TABLET | Freq: Every day | ORAL | Status: DC

## 2013-05-10 ENCOUNTER — Ambulatory Visit: Payer: 59 | Admitting: Internal Medicine

## 2013-05-24 ENCOUNTER — Ambulatory Visit (INDEPENDENT_AMBULATORY_CARE_PROVIDER_SITE_OTHER): Payer: 59 | Admitting: Internal Medicine

## 2013-05-24 ENCOUNTER — Encounter: Payer: Self-pay | Admitting: Internal Medicine

## 2013-05-24 VITALS — BP 129/85 | HR 79 | Temp 98.0°F | Resp 16 | Ht 66.0 in | Wt 285.8 lb

## 2013-05-24 DIAGNOSIS — E669 Obesity, unspecified: Secondary | ICD-10-CM

## 2013-05-24 DIAGNOSIS — E538 Deficiency of other specified B group vitamins: Secondary | ICD-10-CM

## 2013-05-24 DIAGNOSIS — G4700 Insomnia, unspecified: Secondary | ICD-10-CM

## 2013-05-24 MED ORDER — ZOLPIDEM TARTRATE 10 MG PO TABS: 10.0000 mg | ORAL_TABLET | Freq: Every evening | ORAL | Status: DC | PRN

## 2013-05-25 LAB — COMPREHENSIVE METABOLIC PANEL
ALT: 20 U/L (ref 0–35)
AST: 20 U/L (ref 0–37)
Albumin: 4.1 g/dL (ref 3.5–5.2)
Alkaline Phosphatase: 89 U/L (ref 39–117)
BUN: 11 mg/dL (ref 6–23)
CO2: 25 mEq/L (ref 19–32)
Calcium: 9.1 mg/dL (ref 8.4–10.5)
Chloride: 102 mEq/L (ref 96–112)
Creat: 1.08 mg/dL (ref 0.50–1.10)
Glucose, Bld: 79 mg/dL (ref 70–99)
Potassium: 4.2 mEq/L (ref 3.5–5.3)
Sodium: 135 mEq/L (ref 135–145)
Total Bilirubin: 0.5 mg/dL (ref 0.3–1.2)
Total Protein: 6.9 g/dL (ref 6.0–8.3)

## 2013-05-25 LAB — HEMOGLOBIN A1C
Hgb A1c MFr Bld: 5.2 % (ref <5.7)
Mean Plasma Glucose: 103 mg/dL (ref <117)

## 2013-05-25 LAB — CBC WITH DIFFERENTIAL/PLATELET
Basophils Absolute: 0 10*3/uL (ref 0.0–0.1)
Basophils Relative: 0 % (ref 0–1)
Eosinophils Absolute: 0.2 10*3/uL (ref 0.0–0.7)
Eosinophils Relative: 4 % (ref 0–5)
HCT: 38.7 % (ref 36.0–46.0)
Hemoglobin: 13.4 g/dL (ref 12.0–15.0)
Lymphocytes Relative: 39 % (ref 12–46)
Lymphs Abs: 2.1 10*3/uL (ref 0.7–4.0)
MCH: 28.4 pg (ref 26.0–34.0)
MCHC: 34.6 g/dL (ref 30.0–36.0)
MCV: 82 fL (ref 78.0–100.0)
Monocytes Absolute: 0.4 10*3/uL (ref 0.1–1.0)
Monocytes Relative: 8 % (ref 3–12)
Neutro Abs: 2.6 10*3/uL (ref 1.7–7.7)
Neutrophils Relative %: 49 % (ref 43–77)
Platelets: 317 10*3/uL (ref 150–400)
RBC: 4.72 MIL/uL (ref 3.87–5.11)
RDW: 14.1 % (ref 11.5–15.5)
WBC: 5.2 10*3/uL (ref 4.0–10.5)

## 2013-05-25 LAB — LIPID PANEL
Cholesterol: 159 mg/dL (ref 0–200)
HDL: 42 mg/dL (ref >39)
LDL Cholesterol: 89 mg/dL (ref 0–99)
Total CHOL/HDL Ratio: 3.8 Ratio
Triglycerides: 138 mg/dL (ref <150)
VLDL: 28 mg/dL (ref 0–40)

## 2013-05-25 NOTE — Progress Notes (Signed)
  Subjective:    Patient ID: Erin Jimenez, female    DOB: 1971-05-28, 42 y.o.   MRN: [id number]  HPIhere for f/u but upset at long wait so asked for labs and med refills and rescheduled Patient Active Problem List   Diagnosis Date Noted  . Dysphagia 01/10/2013  . IBS (irritable bowel syndrome) 01/10/2013  . Chiari malformation 12/08/2012  . GERD (gastroesophageal reflux disease) 02/10/2012  . Allergic rhinitis due to pollen 02/10/2012  . Insomnia 02/10/2012  . B12 deficiency 02/10/2012  . BMI 40.0-44.9, adult 02/10/2012  . Pseudomeningocele, acquired 06/11/2011  . OBESITY, UNSPECIFIED 02/10/2008  . DEPRESSION 02/10/2008  . CONSTIPATION 02/10/2008  . IRRITABLE BOWEL SYNDROME 02/10/2008  . ENDOMETRIOSIS 02/10/2008   Current outpatient prescriptions: buPROPion (WELLBUTRIN XL) 150 MG 24 hr tablet, Take 150 mg by mouth daily., Disp: , Rfl: ;  cyanocobalamin (,VITAMIN B-12,) 1000 MCG/ML injection, INJECT 1 ML INTO THE MUSCLE EVERY 30 DAYS, NEED REFILLS  LAST INJECTION 05/12/2013, Disp: , Rfl: ;   cyclobenzaprine (FLEXERIL) 10 MG tablet, Take 10 mg by mouth at bedtime., Disp: , Rfl: ;  Est Estrogens-Methyltest (ESTRATEST PO), Take by mouth.  , Disp: , Rfl:  HYDROCODONE-ACETAMINOPHEN PO, Take by mouth.  , Disp: , Rfl: ;   Linaclotide (LINZESS) 145 MCG CAPS, Take 1 capsule (145 mcg total) by mouth daily., Disp: 30 capsule, Rfl: 11;   Loratadine (CLARITIN PO), Take 10 tablets by mouth daily. , Disp: , Rfl: ;   pantoprazole (PROTONIX) 40 MG tablet, Take 1 tablet (40 mg total) by mouth daily before breakfast., Disp: 90 tablet, Rfl: 3 zolpidem (AMBIEN) 10 MG tablet, Take 1 tablet (10 mg total) by mouth at bedtime as needed for sleep.  dicyclomine (BENTYL) 10 MG capsule, Take 1 capsule (10 mg total) by mouth 3 (three) times daily as needed (For cramping )., Disp: 30 capsule, Rfl: 0;   fluticasone (FLONASE) 50 MCG/ACT nasal spray, Place 2 sprays into the nose daily. Taking as needed, Disp: , Rfl:    Told cna all problems stable Colonoscopy and endoscopy per Dr. Leone Payor in April 2014 both within normal limits  Review of Systems     Objective:   Physical Exam BP 129/85  Pulse 79  Temp(Src) 98 F (36.7 C) (Oral)  Resp 16  Ht 5\' 6"  (1.676 m)  Wt 285 lb 12.8 oz (129.638 kg)  BMI 46.15 kg/m2  SpO2 98% 274 to 285 since last cpe 01/2012       Assessment & Plan:  B12 deficiency - Plan: B12 and Folate Panel, Lipid panel  Obesity, unspecified - Plan: CBC with Differential, Comprehensive metabolic panel, Lipid panel, Hemoglobin A1C  Insomnia - refill Ambien  Meds ordered this encounter  Medications  . zolpidem (AMBIEN) 10 MG tablet    Sig: Take 1 tablet (10 mg total) by mouth at bedtime as needed for sleep.    Dispense:  90 tablet    Refill:  3  . fluticasone (FLONASE) 50 MCG/ACT nasal spray    Sig: Place 2 sprays into the nose daily. Taking as needed  . cyanocobalamin (,VITAMIN B-12,) 1000 MCG/ML injection    Sig: INJECT 1 ML INTO THE MUSCLE EVERY 30 DAYS, NEED REFILLS  LAST INJECTION 05/12/2013   Needs to consider aggressive weight loss

## 2013-05-27 LAB — B12 AND FOLATE PANEL
Folate: 5.2 ng/mL (ref >3.0)
Vitamin B-12: 355 pg/mL (ref 211–946)

## 2013-05-30 ENCOUNTER — Encounter: Payer: Self-pay | Admitting: Internal Medicine

## 2013-06-05 ENCOUNTER — Other Ambulatory Visit: Payer: Self-pay | Admitting: Neurosurgery

## 2013-06-05 DIAGNOSIS — M542 Cervicalgia: Secondary | ICD-10-CM

## 2013-06-18 ENCOUNTER — Other Ambulatory Visit: Payer: Self-pay | Admitting: Physician Assistant

## 2013-06-23 ENCOUNTER — Ambulatory Visit
Admission: RE | Admit: 2013-06-23 | Discharge: 2013-06-23 | Disposition: A | Discharge status: Home / Self Care | Payer: 59 | Source: Ambulatory Visit | Attending: Neurosurgery | Admitting: Neurosurgery

## 2013-06-23 DIAGNOSIS — M542 Cervicalgia: Secondary | ICD-10-CM

## 2013-10-15 IMAGING — CT CT CERVICAL SPINE W/O CM
5 of 7 series · 14 of 34 positions shown, 15 images · non-contrast
Comparison: Cervical spine MRI 12/19/2012, radiograph 06/14/2012
and CT 02/24/2011.

CLINICAL DATA: Occipital headaches with right neck pain increasing
over the last month.  History of Chiari malformation and cervical
fusion in 5245.

CT CERVICAL SPINE WITHOUT CONTRAST
TECHNIQUE: Multidetector CT imaging of the cervical spine was
performed. Multiplanar CT image reconstructions were also
generated.

[Series 3: c spine bone · axial · 0.23mm/px · z∈[-148,-90]mm · 2 of 71 slices shown]
[im 24/71  bone]
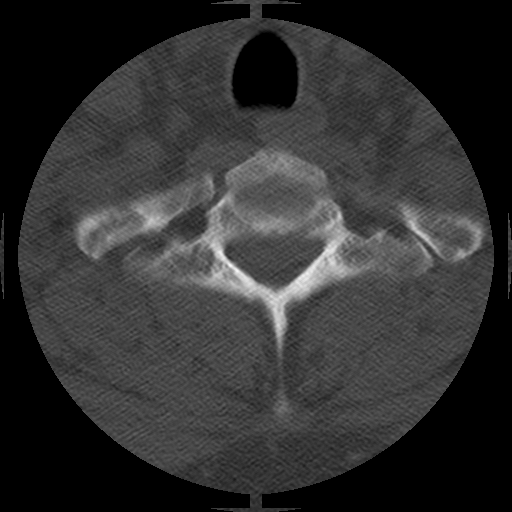
[im 47/71  bone]
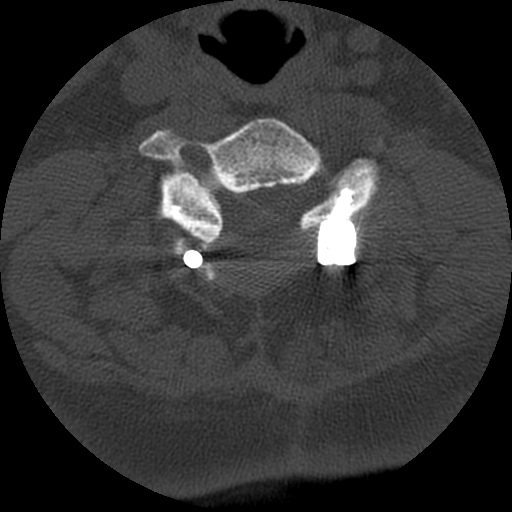

[Series 4: c spine soft · axial · 0.23mm/px · z∈[-148,-90]mm · 2 of 71 slices shown]
[im 24/71  soft-tissue]
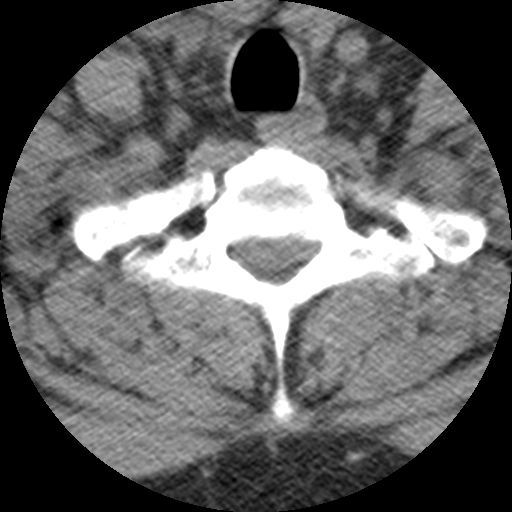
[im 47/71  soft-tissue]
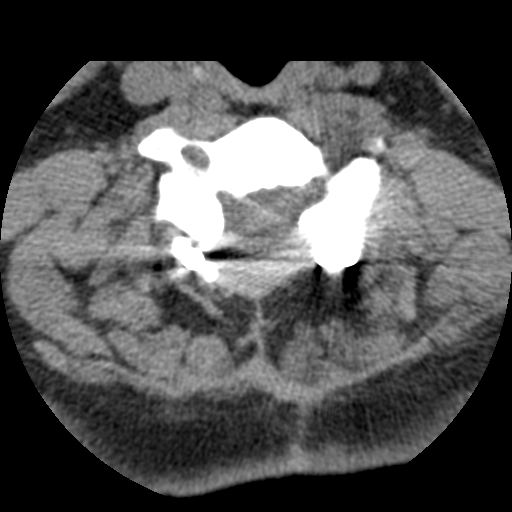

[Series 201: cor lower · coronal · 0.35mm/px · 3 of 37 slices shown]
[im 2/37  soft-tissue]
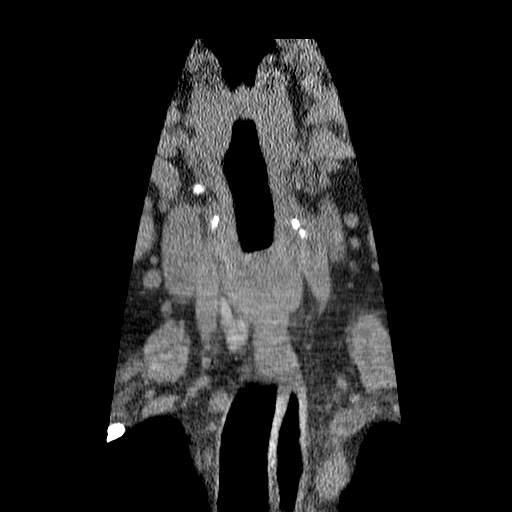
[im 13/37  bone]
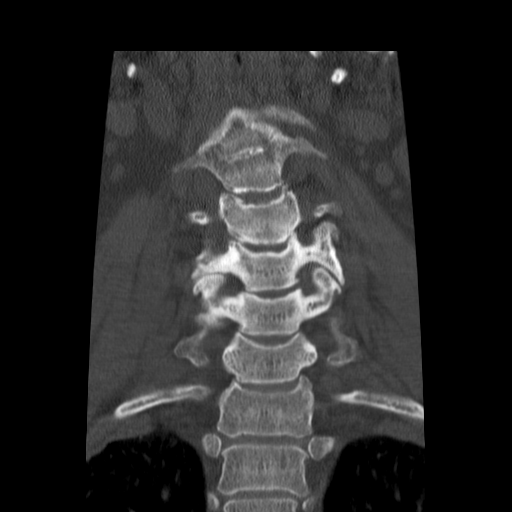
[im 25/37  bone]
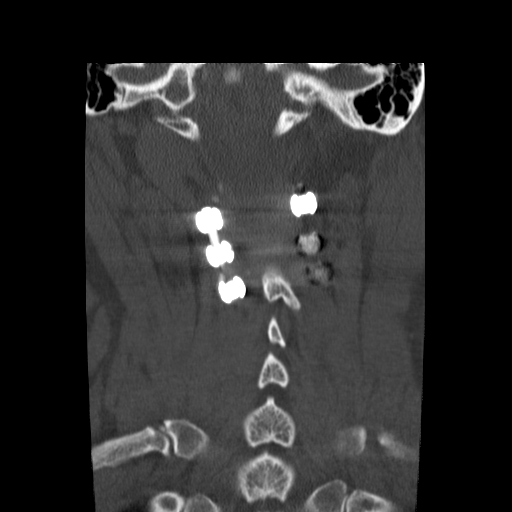

[Series 202: sag upper · sagittal · 0.35mm/px · 3 of 37 slices shown]
[im 8/37  bone]
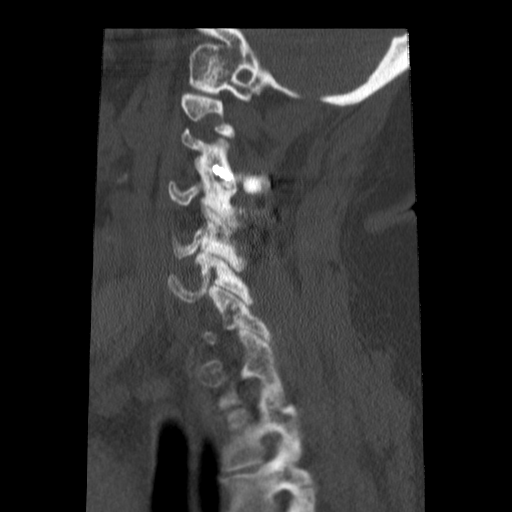
[im 15/37  bone]
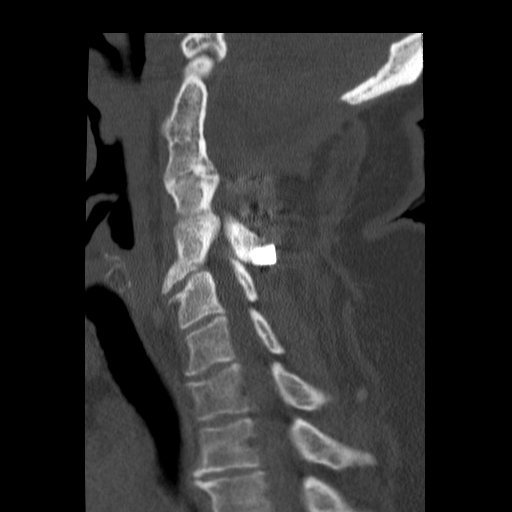
[im 22/37  bone]
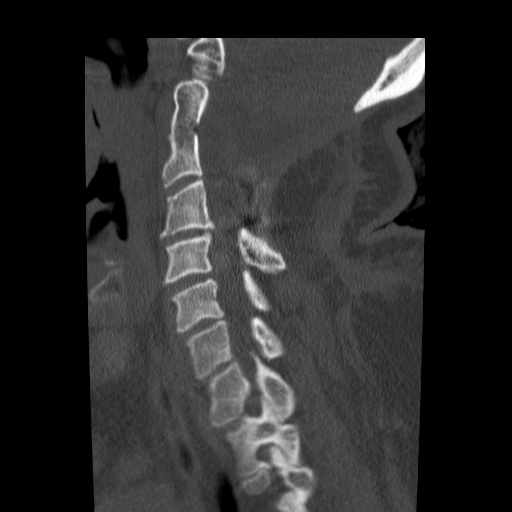

[Series 204: axialk · axial · 0.23mm/px · z∈[-177,-67]mm · 4 of 93 slices shown, 5 images]
[im 19/93  soft-tissue]
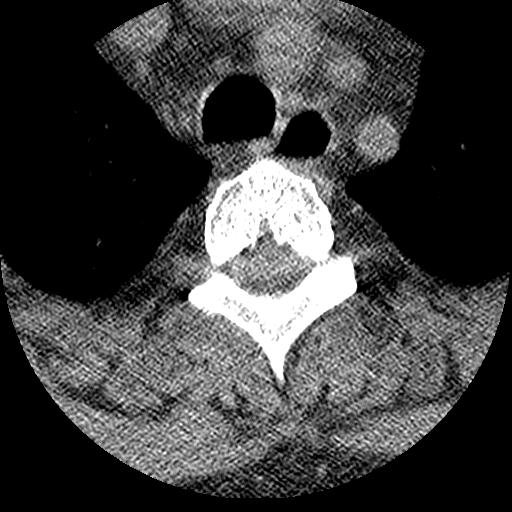
[im 19/93  bone]
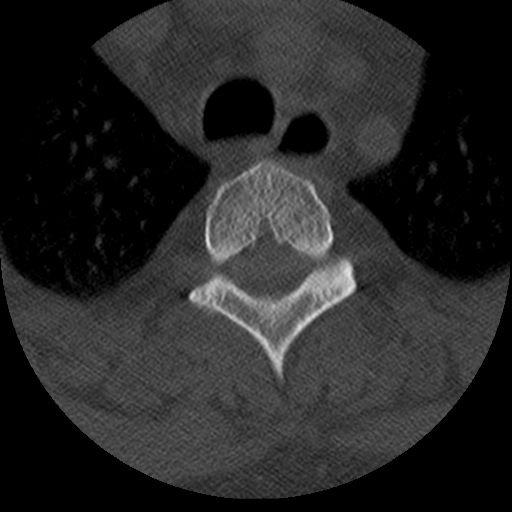
[im 37/93  bone]
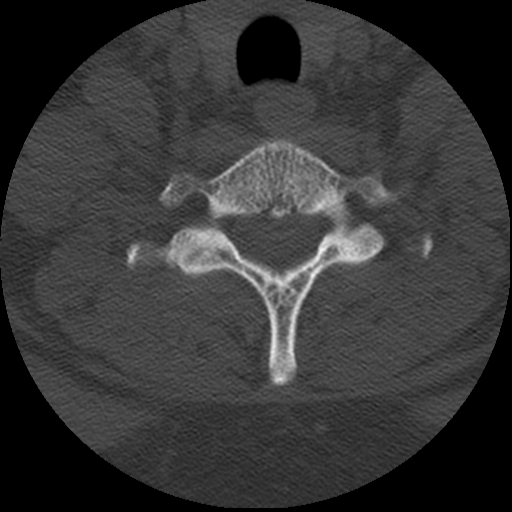
[im 56/93  bone]
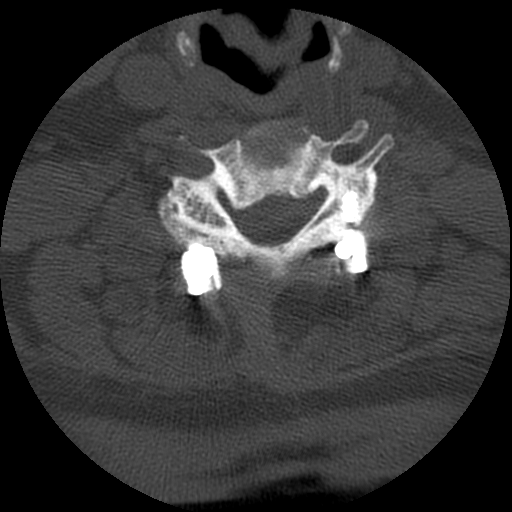
[im 74/93  bone]
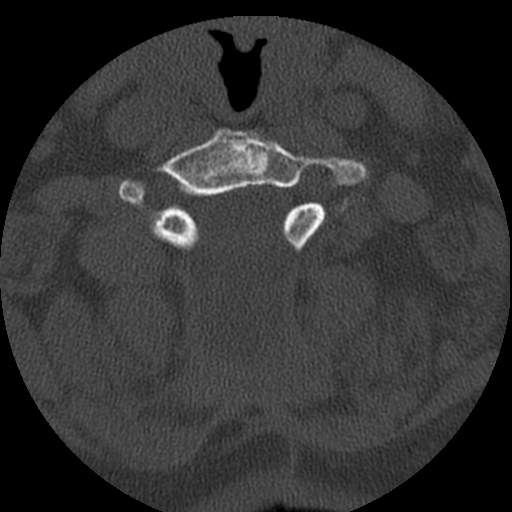

[14 of 34 positions shown; findings below may reference images not displayed]

FINDINGS: As demonstrated on the prior studies, there is congenital
atlanto-occipital fusion and C2-C3 fusion.  The patient has
undergone surgical decompression of the occiput and posterior
elements from C1-C4 with posterior surgical fusion from C3-C5.  The
hardware appears intact compared with the most recent MRI.  The
overall alignment is stable with a mild scoliosis.

There has been apparent continued involution of the posterior fluid
collection at the operative level, better demonstrated on MRI.
Cord evaluation is limited.

C2-C3:  Congenital fusion with surgical posterior decompression.
No spinal stenosis or nerve root encroachment.

C3-C4:  Stable prominent asymmetric uncinate spurring on the right
contributing to chronic right foraminal stenosis.  The spinal canal
is posteriorly decompressed.

C4-C5:  Stable uncinate spurring bilaterally contributing to right
greater than left foraminal stenosis.  The spinal canal is
posteriorly decompressed.

C5-C6:  Mild disc bulging and facet hypertrophy.  No significant
spinal stenosis or nerve root encroachment.

C6-C7:  Stable left paracentral osteophyte and mild uncinate
spurring.  No significant foraminal compromise.

C7-C1:  No significant findings.
IMPRESSION: 1.  Stable postsurgical findings status post occipital
decompression, posterior decompression from C1-C4 and C3-C5 fusion.
2.  Underlying congenital atlanto-occipital and C2-C3 fusion
3.  Stable spondylosis with mild uncinate spurring and facet
hypertrophy contributing to mild foraminal narrowing as described.
No acute findings.

## 2013-12-01 ENCOUNTER — Other Ambulatory Visit: Payer: Self-pay | Admitting: Internal Medicine

## 2013-12-01 NOTE — Telephone Encounter (Signed)
Called [facility] concerning RFs should be left on 05/2013 Rx for #90 w/3 RFs. Advised that controlled subst Rxs scripts expire after 6 mos. I called in the remaining 2 RFs on Dr Doolittle's orig order.

## 2014-04-12 ENCOUNTER — Other Ambulatory Visit: Payer: Self-pay | Admitting: Internal Medicine

## 2014-04-13 ENCOUNTER — Telehealth: Payer: Self-pay | Admitting: Internal Medicine

## 2014-04-13 MED ORDER — LINACLOTIDE 145 MCG PO CAPS: 145.0000 ug | ORAL_CAPSULE | Freq: Every day | ORAL | Status: DC

## 2014-04-13 NOTE — Telephone Encounter (Signed)
Refill sent in as requested, appointment made.

## 2014-04-23 ENCOUNTER — Ambulatory Visit (INDEPENDENT_AMBULATORY_CARE_PROVIDER_SITE_OTHER): Payer: 59 | Admitting: Gastroenterology

## 2014-04-23 ENCOUNTER — Encounter: Payer: Self-pay | Admitting: Gastroenterology

## 2014-04-23 VITALS — BP 104/74 | HR 80 | Ht 65.0 in | Wt 291.0 lb

## 2014-04-23 DIAGNOSIS — K589 Irritable bowel syndrome without diarrhea: Secondary | ICD-10-CM

## 2014-04-23 DIAGNOSIS — K5900 Constipation, unspecified: Secondary | ICD-10-CM

## 2014-04-23 DIAGNOSIS — K219 Gastro-esophageal reflux disease without esophagitis: Secondary | ICD-10-CM

## 2014-04-23 MED ORDER — DICYCLOMINE HCL 10 MG PO CAPS: 10.0000 mg | ORAL_CAPSULE | Freq: Three times a day (TID) | ORAL | Status: DC | PRN

## 2014-04-23 MED ORDER — LINACLOTIDE 145 MCG PO CAPS: 145.0000 ug | ORAL_CAPSULE | Freq: Every day | ORAL | Status: DC

## 2014-04-23 MED ORDER — PANTOPRAZOLE SODIUM 40 MG PO TBEC: 40.0000 mg | DELAYED_RELEASE_TABLET | Freq: Every day | ORAL | Status: DC

## 2014-04-23 NOTE — Patient Instructions (Signed)
We have sent the following medications to your pharmacy for you to pick up at your convenience: Protonix for ninety day supply  Bentyl and Linzess for thirty day supply

## 2014-04-23 NOTE — Progress Notes (Signed)
04/23/2014 Erin Jimenez [id number] 1971-05-17   History of Present Illness:  This is a pleasant 43 year old female who is known to Dr. Carlean Purl for treatment of her GERD and IBS-C.  She is here today simply for refills of her medications.  She is currently taking pantoprazole 40 mg daily, linzess 145 mcg daily, and bentyl prn for her symptoms and says that the medications have been working well without any complaints at her visit today.  Colonoscopy 03/2013 was normal with repeat recommended in 10 years from that time.  She also had an EGD in 03/2013 at which time that was normal as well (esophagus was empirically dilated with 54 [place] Maloney due to complaints of dysphagia).  Has routine visit with her PCP later this month.   Current Medications, Allergies, Past Medical History, Past Surgical History, Family History and Social History were reviewed in [employer] record.   Physical Exam: BP 104/74  Pulse 80  Ht 5\' 5"  (1.651 m)  Wt 291 lb (131.997 kg)  BMI 48.43 kg/m2 General: Well developed white female in no acute distress Head: Normocephalic and atraumatic Eyes:  Sclerae anicteric, conjunctiva pink  Ears: Normal auditory acuity Lungs: Clear throughout to auscultation Heart: Regular rate and rhythm Abdomen: Soft, obese, non-distended.  Normal bowel sounds.  Non-tender. Musculoskeletal: Symmetrical with no gross deformities  Extremities: No edema  Neurological: Alert oriented x 4, grossly non-focal Psychological:  Alert and cooperative. Normal mood and affect  Assessment and Recommendations: -GERD:  Well-controlled on pantoprazole 40 mg daily.  Will continue. -IBS-C:  Well-controlled on Linzess 145 mcg daily and bentyl prn.  Will continue.  *We will refill all three prescriptions listed above.

## 2014-04-23 NOTE — Progress Notes (Signed)
Agree w/ Erin Jimenez 

## 2014-05-02 ENCOUNTER — Ambulatory Visit (INDEPENDENT_AMBULATORY_CARE_PROVIDER_SITE_OTHER): Payer: 59 | Admitting: Internal Medicine

## 2014-05-02 ENCOUNTER — Encounter: Payer: Self-pay | Admitting: Internal Medicine

## 2014-05-02 VITALS — BP 123/71 | HR 81 | Temp 98.4°F | Resp 16 | Ht 66.0 in | Wt 290.0 lb

## 2014-05-02 DIAGNOSIS — G96198 Other disorders of meninges, not elsewhere classified: Secondary | ICD-10-CM

## 2014-05-02 DIAGNOSIS — E538 Deficiency of other specified B group vitamins: Secondary | ICD-10-CM

## 2014-05-02 DIAGNOSIS — Z0000 Encounter for general adult medical examination without abnormal findings: Secondary | ICD-10-CM

## 2014-05-02 DIAGNOSIS — G9619 Other disorders of meninges, not elsewhere classified: Secondary | ICD-10-CM

## 2014-05-02 DIAGNOSIS — R1310 Dysphagia, unspecified: Secondary | ICD-10-CM

## 2014-05-02 DIAGNOSIS — Z23 Encounter for immunization: Secondary | ICD-10-CM

## 2014-05-02 DIAGNOSIS — IMO0002 Reserved for concepts with insufficient information to code with codable children: Secondary | ICD-10-CM

## 2014-05-02 DIAGNOSIS — Z6841 Body Mass Index (BMI) 40.0 and over, adult: Secondary | ICD-10-CM

## 2014-05-02 DIAGNOSIS — Q054 Unspecified spina bifida with hydrocephalus: Secondary | ICD-10-CM

## 2014-05-02 DIAGNOSIS — D229 Melanocytic nevi, unspecified: Secondary | ICD-10-CM

## 2014-05-02 DIAGNOSIS — D239 Other benign neoplasm of skin, unspecified: Secondary | ICD-10-CM

## 2014-05-02 DIAGNOSIS — E669 Obesity, unspecified: Secondary | ICD-10-CM

## 2014-05-02 DIAGNOSIS — G4710 Hypersomnia, unspecified: Secondary | ICD-10-CM

## 2014-05-02 DIAGNOSIS — G4700 Insomnia, unspecified: Secondary | ICD-10-CM

## 2014-05-02 DIAGNOSIS — G4730 Sleep apnea, unspecified: Secondary | ICD-10-CM

## 2014-05-02 LAB — CBC WITH DIFFERENTIAL/PLATELET
Basophils Absolute: 0 10*3/uL (ref 0.0–0.1)
Basophils Relative: 1 % (ref 0–1)
Eosinophils Absolute: 0.1 10*3/uL (ref 0.0–0.7)
Eosinophils Relative: 3 % (ref 0–5)
HCT: 39.6 % (ref 36.0–46.0)
Hemoglobin: 13.5 g/dL (ref 12.0–15.0)
Lymphocytes Relative: 41 % (ref 12–46)
Lymphs Abs: 2 10*3/uL (ref 0.7–4.0)
MCH: 28.7 pg (ref 26.0–34.0)
MCHC: 34.1 g/dL (ref 30.0–36.0)
MCV: 84.1 fL (ref 78.0–100.0)
Monocytes Absolute: 0.5 10*3/uL (ref 0.1–1.0)
Monocytes Relative: 10 % (ref 3–12)
Neutro Abs: 2.2 10*3/uL (ref 1.7–7.7)
Neutrophils Relative %: 45 % (ref 43–77)
Platelets: 313 10*3/uL (ref 150–400)
RBC: 4.71 MIL/uL (ref 3.87–5.11)
RDW: 14.1 % (ref 11.5–15.5)
WBC: 4.8 10*3/uL (ref 4.0–10.5)

## 2014-05-02 LAB — COMPREHENSIVE METABOLIC PANEL
ALT: 17 U/L (ref 0–35)
AST: 16 U/L (ref 0–37)
Albumin: 4.1 g/dL (ref 3.5–5.2)
Alkaline Phosphatase: 103 U/L (ref 39–117)
BUN: 12 mg/dL (ref 6–23)
CO2: 25 mEq/L (ref 19–32)
Calcium: 9.1 mg/dL (ref 8.4–10.5)
Chloride: 103 mEq/L (ref 96–112)
Creat: 1.11 mg/dL — ABNORMAL HIGH (ref 0.50–1.10)
Glucose, Bld: 74 mg/dL (ref 70–99)
Potassium: 4.4 mEq/L (ref 3.5–5.3)
Sodium: 138 mEq/L (ref 135–145)
Total Bilirubin: 0.5 mg/dL (ref 0.2–1.2)
Total Protein: 7.1 g/dL (ref 6.0–8.3)

## 2014-05-02 LAB — POCT URINALYSIS DIPSTICK
Bilirubin, UA: NEGATIVE
Blood, UA: NEGATIVE
Glucose, UA: NEGATIVE
Ketones, UA: NEGATIVE
Leukocytes, UA: NEGATIVE
Nitrite, UA: NEGATIVE
Protein, UA: NEGATIVE
Spec Grav, UA: 1.01
Urobilinogen, UA: 0.2
pH, UA: 5.5

## 2014-05-02 LAB — LIPID PANEL
Cholesterol: 153 mg/dL (ref 0–200)
HDL: 46 mg/dL (ref >39)
LDL Cholesterol: 84 mg/dL (ref 0–99)
Total CHOL/HDL Ratio: 3.3 Ratio
Triglycerides: 113 mg/dL (ref <150)
VLDL: 23 mg/dL (ref 0–40)

## 2014-05-02 MED ORDER — ZOLPIDEM TARTRATE 10 MG PO TABS: 10.0000 mg | ORAL_TABLET | Freq: Every evening | ORAL | Status: DC | PRN

## 2014-05-02 MED ORDER — CYANOCOBALAMIN 1000 MCG/ML IJ SOLN: 1000.0000 ug | INTRAMUSCULAR | Status: DC

## 2014-05-02 NOTE — Progress Notes (Signed)
Subjective:  This chart was scribed for Tami Lin, MD by Roxan Diesel, Scribe.  This patient was seen in [place] [facility] and the patient's care was started at 1:35 PM.   Patient ID: Erin Jimenez, female    DOB: Nov 09, 1971, 43 y.o.   MRN: [id number]  HPI  HPI Comments: Erin Jimenez is a 43 y.o. female who presents to [facility] for an annual exam.  She has had a spinal fusion due to her chiari malformation and continues to complain of neck pain and neck stiffness.  She has been advised against massage therapy as a treatment for this.  She reports some sleep deprivation because her pain makes it difficult for her to get to sleep even when she is exhausted.  However once she is asleep she does not wake up in the night and wakes up in the morning feeling rested.  She does snore but she denies waking up gasping for air, and she has not been told that she breathes strange while sleeping.  She does note some occasional mild daytime hypersomnolence.  She continues to complain of chronic dizziness and intermittent tremor due to chiari malformation and brain surgery.  These symptoms are unchanged.  She also continue to have occasional dysphagia while eating.  She has been told that this may be connected to her spinal fusion.  Her symptoms are unchanged.  She notes some intermittent mild aching in her right ear that does not seem to be associated with any particular activities or time of day.  She had some seasonal allergy symptoms earlier in the season but these have since resolved.  She is asking for assistance or advice on losing weight.  Tetanus is UTD.  She needs a mammogram.  She does not have a dermatologist.  She requesting a longer refill on her B-12 injections.  Her husband gives these to her.  She also needs a refill on Ambien.    Patient Active Problem List   Diagnosis Date Noted  . Dysphagia 01/10/2013  . IBS (irritable bowel syndrome) 01/10/2013  . Chiari malformation 12/08/2012    . GERD (gastroesophageal reflux disease) 02/10/2012  . Allergic rhinitis due to pollen 02/10/2012  . Insomnia 02/10/2012  . B12 deficiency 02/10/2012  . BMI 40.0-44.9, adult 02/10/2012  . Pseudomeningocele, acquired 06/11/2011  . OBESITY, UNSPECIFIED 02/10/2008  . DEPRESSION 02/10/2008  . CONSTIPATION 02/10/2008  . IRRITABLE BOWEL SYNDROME 02/10/2008  . ENDOMETRIOSIS 02/10/2008    Past Medical History  Diagnosis Date  . Irritable bowel syndrome   . Chronic headache   . Chiari malformation   . GERD (gastroesophageal reflux disease)   . Endometriosis   . Obesity   . Depression   . B12 deficiency   . Insomnia   . Pseudomeningocele, acquired     Past Surgical History  Procedure Laterality Date  . Abdominal hysterectomy  2001  . Laparoscopic gastric banding  2006  . Spinal fusion    . Cholecystectomy    . Brain surgery      Chiari malformation  . Ercp  2000    Prior to Admission medications   Medication Sig Start Date End Date Taking? Authorizing Provider  buPROPion (WELLBUTRIN XL) 150 MG 24 hr tablet Take 150 mg by mouth daily.   Yes Historical Provider, MD  cyanocobalamin (,VITAMIN B-12,) 1000 MCG/ML injection INJECT 1 ML INTO THE MUSCLE EVERY 30 DAYS, NEED REFILLS  LAST INJECTION 05/12/2013 04/13/13  Yes Eleanore E Egan, PA-C  cyclobenzaprine (FLEXERIL) 10  MG tablet Take 10 mg by mouth at bedtime.   Yes Historical Provider, MD  dicyclomine (BENTYL) 10 MG capsule Take 1 capsule (10 mg total) by mouth 3 (three) times daily as needed (For cramping ). 04/23/14  Yes Jessica D. Zehr, PA-C  Est Estrogens-Methyltest (ESTRATEST PO) Take by mouth.     Yes Historical Provider, MD  HYDROCODONE-ACETAMINOPHEN PO Take by mouth.     Yes Historical Provider, MD  Linaclotide Rolan Lipa) 145 MCG CAPS capsule Take 1 capsule (145 mcg total) by mouth daily. 04/23/14  Yes Jessica D. Zehr, PA-C  Loratadine (CLARITIN PO) Take 10 tablets by mouth daily.    Yes Historical Provider, MD  pantoprazole  (PROTONIX) 40 MG tablet Take 1 tablet (40 mg total) by mouth daily before breakfast. 04/23/14  Yes Jessica D. Zehr, PA-C  zolpidem (AMBIEN) 10 MG tablet Take 1 tablet (10 mg total) by mouth at bedtime as needed for sleep. 05/24/13  Yes Leandrew Koyanagi, MD  fluticasone (FLONASE) 50 MCG/ACT nasal spray Place 2 sprays into the nose daily. Taking as needed 02/10/12 02/09/13  Leandrew Koyanagi, MD     Review of Systems  Constitutional: Positive for fatigue (occasional mild daytime hypersomnolence).  HENT: Positive for ear pain and trouble swallowing.   Respiratory: Negative for apnea.   Musculoskeletal: Positive for arthralgias, neck pain and neck stiffness.  Allergic/Immunologic: Negative for environmental allergies.  Neurological: Positive for dizziness, light-headedness, numbness and headaches.  Psychiatric/Behavioral: Positive for sleep disturbance.        Objective:   Physical Exam  Nursing note and vitals reviewed. Constitutional: She is oriented to person, place, and time. She appears well-developed and well-nourished. No distress.  Morbid obesity.  HENT:  Head: Normocephalic and atraumatic.  Right Ear: Tympanic membrane, external ear and ear canal normal.  Left Ear: Tympanic membrane, external ear and ear canal normal.  Eyes: EOM are normal.  Neck:  Poor ROM secondary to stiffness and discomfort.  Surgical scar posterior cervical area.  Cardiovascular: Normal rate, regular rhythm and normal heart sounds.   No murmur heard. Pulmonary/Chest: Effort normal and breath sounds normal. No respiratory distress. She has no wheezes. She has no rales.  Abdominal: Soft. She exhibits no distension. There is no splenomegaly or hepatomegaly. There is no tenderness.  Musculoskeletal: Normal range of motion.  Neurological: She is alert and oriented to person, place, and time.  Skin: Skin is warm and dry.  5 irregular, darkly-pigmented nevi on the back.  Psychiatric: She has a normal mood and  affect. Her behavior is normal.     BP 123/71  Pulse 81  Temp(Src) 98.4 F (36.9 C)  Resp 16  Ht 5\' 6"  (1.676 m)  Wt 290 lb (131.543 kg)  BMI 46.83 kg/m2  SpO2 95%  Visual Acuity Screening   Right eye Left eye Both eyes  Without correction:     With correction: 20/25 20/25 20/15          Assessment & Plan:  Annual physical exam - Plan: POCT urinalysis dipstick, CBC with Differential  Need for Tdap vaccination - Plan: Tdap vaccine greater than or equal to 7yo IM  Pseudomeningocele, acquired  Insomnia---ref ambien  B12 deficiency - Plan: Vitamin B12, Folate, CANCELED: B12 and Folate Panel  Try to give larger supply b12  BMI 40.0-44.9, adult - Plan: Comprehensive metabolic panel, Lipid panel, TSH  Long disc re:CBT/group there/incr exer vs decr intk etc--also reward ctr  vs control ctr  Chiari malformation--Surgical repair apparently causing Dysphagia  Hypersomnia with sleep apnea, unspecified - Plan: Ambulatory referral to Sleep Studies  Multiple melanocytic nevi - Plan: Ambulatory referral to Dermatology  Meds ordered this encounter  Medications  . zolpidem (AMBIEN) 10 MG tablet    Sig: Take 1 tablet (10 mg total) by mouth at bedtime as needed for sleep.    Dispense:  90 tablet    Refill:  3  . cyanocobalamin (,VITAMIN B-12,) 1000 MCG/ML injection    Sig: Inject 1 mL (1,000 mcg total) into the muscle every 30 (thirty) days.    Dispense:  25 mL    Refill:  0    May adjust to 12 month supply       I have completed the patient encounter in its entirety as documented by the scribe, with editing by me where necessary. Conchita Truxillo P. Laney Pastor, M.D.

## 2014-05-02 NOTE — Progress Notes (Signed)
   Subjective:    Patient ID: Erin Jimenez, female    DOB: [birth year], 43 y.o.   MRN: [id number]  HPI    Review of Systems  Musculoskeletal: Positive for arthralgias, neck pain and neck stiffness.  Neurological: Positive for dizziness, light-headedness, numbness and headaches.       Objective:   Physical Exam        Assessment & Plan:

## 2014-05-03 LAB — FOLATE: Folate: 9.3 ng/mL

## 2014-05-03 LAB — VITAMIN B12: Vitamin B-12: 338 pg/mL (ref 211–911)

## 2014-05-03 LAB — TSH: TSH: 2.166 u[IU]/mL (ref 0.350–4.500)

## 2014-05-04 ENCOUNTER — Encounter: Payer: Self-pay | Admitting: Internal Medicine

## 2014-05-05 ENCOUNTER — Encounter: Payer: Self-pay | Admitting: Internal Medicine

## 2014-05-15 ENCOUNTER — Institutional Professional Consult (permissible substitution): Payer: 59 | Admitting: Neurology

## 2014-10-25 ENCOUNTER — Other Ambulatory Visit: Payer: Self-pay | Admitting: Neurosurgery

## 2014-10-25 DIAGNOSIS — G935 Compression of brain: Secondary | ICD-10-CM

## 2014-11-12 ENCOUNTER — Ambulatory Visit (INDEPENDENT_AMBULATORY_CARE_PROVIDER_SITE_OTHER): Payer: 59 | Admitting: Emergency Medicine

## 2014-11-12 VITALS — BP 130/80 | HR 95 | Temp 98.5°F | Resp 18 | Ht 66.0 in | Wt 294.0 lb

## 2014-11-12 DIAGNOSIS — J0100 Acute maxillary sinusitis, unspecified: Secondary | ICD-10-CM

## 2014-11-12 MED ORDER — BENZONATATE 100 MG PO CAPS: 100.0000 mg | ORAL_CAPSULE | Freq: Three times a day (TID) | ORAL | Status: DC | PRN

## 2014-11-12 MED ORDER — AMOXICILLIN 875 MG PO TABS: 875.0000 mg | ORAL_TABLET | Freq: Two times a day (BID) | ORAL | Status: DC

## 2014-11-12 MED ORDER — FLUTICASONE PROPIONATE 50 MCG/ACT NA SUSP: 2.0000 | Freq: Every day | NASAL | Status: DC

## 2014-11-12 NOTE — Progress Notes (Signed)
Subjective:  This chart was scribed for Arlyss Queen, MD by Donato Schultz, Medical Scribe. This patient was seen in Room 5 and the patient's care was started at 10:59 AM.   Patient ID: Erin Jimenez, female    DOB: Oct 20, 1971, 43 y.o.   MRN: [id number]  HPI HPI Comments: Erin Jimenez is a 43 y.o. female who presents to the [facility] complaining of constant, progressively worsening sinus pressure and bilateral ear pain that started 4 weeks ago after she returned home from a cruise.  Her mucous is clear when she blows her nose and she noticed green sputum when she coughed this morning.  She had a sore throat on Friday which has since resolved.  Her chest has been aching due to her cough.  She has been taking Nyquil with no improvement to her symptoms.  She denies facial pain as an associated symptom.  She is allergic to Doxycycline and Cipro.  She is not taking any oral contraceptives.     Patient Active Problem List   Diagnosis Date Noted  . Dysphagia 01/10/2013  . IBS (irritable bowel syndrome) 01/10/2013  . Chiari malformation 12/08/2012  . GERD (gastroesophageal reflux disease) 02/10/2012  . Allergic rhinitis due to pollen 02/10/2012  . Insomnia 02/10/2012  . B12 deficiency 02/10/2012  . BMI 40.0-44.9, adult 02/10/2012  . Pseudomeningocele, acquired 06/11/2011  . OBESITY, UNSPECIFIED 02/10/2008  . DEPRESSION 02/10/2008  . CONSTIPATION 02/10/2008  . IRRITABLE BOWEL SYNDROME 02/10/2008  . ENDOMETRIOSIS 02/10/2008   Past Medical History  Diagnosis Date  . Irritable bowel syndrome   . Chronic headache   . Chiari malformation   . GERD (gastroesophageal reflux disease)   . Endometriosis   . Obesity   . Depression   . B12 deficiency   . Insomnia   . Pseudomeningocele, acquired    Past Surgical History  Procedure Laterality Date  . Abdominal hysterectomy  2001  . Laparoscopic gastric banding  2006  . Spinal fusion    . Cholecystectomy    . Brain surgery        Chiari malformation  . Ercp  2000   Allergies  Allergen Reactions  . Ciprofloxacin Nausea Only  . Doxycycline Nausea Only  . Oxycodone Nausea Only  . Sulfa Antibiotics Nausea Only   Prior to Admission medications   Medication Sig Start Date End Date Taking? Authorizing Provider  cyanocobalamin (,VITAMIN B-12,) 1000 MCG/ML injection Inject 1 mL (1,000 mcg total) into the muscle every 30 (thirty) days. 05/02/14  Yes Leandrew Koyanagi, MD  cyclobenzaprine (FLEXERIL) 10 MG tablet Take 10 mg by mouth at bedtime.   Yes Historical Provider, MD  dicyclomine (BENTYL) 10 MG capsule Take 1 capsule (10 mg total) by mouth 3 (three) times daily as needed (For cramping ). 04/23/14  Yes Jessica D. Zehr, PA-C  Est Estrogens-Methyltest (ESTRATEST PO) Take by mouth.     Yes Historical Provider, MD  fluticasone (FLONASE) 50 MCG/ACT nasal spray Place 2 sprays into the nose daily. Taking as needed 02/10/12 11/12/14 Yes Leandrew Koyanagi, MD  HYDROCODONE-ACETAMINOPHEN PO Take by mouth.     Yes Historical Provider, MD  Linaclotide Rolan Lipa) 145 MCG CAPS capsule Take 1 capsule (145 mcg total) by mouth daily. 04/23/14  Yes Jessica D. Zehr, PA-C  Loratadine (CLARITIN PO) Take 10 tablets by mouth daily.    Yes Historical Provider, MD  pantoprazole (PROTONIX) 40 MG tablet Take 1 tablet (40 mg total) by mouth daily before breakfast.  04/23/14  Yes Jessica D. Zehr, PA-C  zolpidem (AMBIEN) 10 MG tablet Take 1 tablet (10 mg total) by mouth at bedtime as needed for sleep. 05/02/14  Yes Leandrew Koyanagi, MD   History   Social History  . Marital Status: Married    Spouse Name: N/A    Number of Children: N/A  . Years of Education: N/A   Occupational History  . sales Museum/gallery conservator   Social History Main Topics  . Smoking status: Never Smoker   . Smokeless tobacco: Never Used  . Alcohol Use: Yes     Comment: occ  . Drug Use: No  . Sexual Activity: Not Currently   Other Topics Concern  . Not on file    Social History Narrative   Married   Contractor           Review of Systems  HENT: Positive for congestion, ear pain and sinus pressure. Negative for sore throat.   Respiratory: Positive for cough.   Cardiovascular: Positive for chest pain.      Objective:  Physical Exam  Constitutional: She is oriented to person, place, and time. She appears well-developed and well-nourished.  HENT:  Head: Normocephalic and atraumatic.  Right Ear: Hearing, external ear and ear canal normal. Tympanic membrane is not bulging.  Left Ear: Hearing, tympanic membrane, external ear and ear canal normal.  Nose: Nose normal.  Mouth/Throat: Oropharynx is clear and moist. No oropharyngeal exudate.  Right TM is dull.  No bulging.  Puffiness over the maxillary sinuses.  Eyes: Conjunctivae and EOM are normal. Pupils are equal, round, and reactive to light. No scleral icterus.  Neck: Normal range of motion. Neck supple. No thyromegaly present.  Cardiovascular: Normal rate, regular rhythm and normal heart sounds.   No murmur heard. Pulmonary/Chest: Effort normal and breath sounds normal. No respiratory distress. She has no wheezes. She has no rales.  Musculoskeletal: Normal range of motion.  Lymphadenopathy:    She has no cervical adenopathy.  Neurological: She is alert and oriented to person, place, and time.  Skin: Skin is warm and dry.  Psychiatric: She has a normal mood and affect. Her behavior is normal.  Nursing note and vitals reviewed.  Meds ordered this encounter  Medications  . amoxicillin (AMOXIL) 875 MG tablet    Sig: Take 1 tablet (875 mg total) by mouth 2 (two) times daily.    Dispense:  20 tablet    Refill:  0  . fluticasone (FLONASE) 50 MCG/ACT nasal spray    Sig: Place 2 sprays into both nostrils daily.    Dispense:  16 g    Refill:  6  . benzonatate (TESSALON) 100 MG capsule    Sig: Take 1-2 capsules (100-200 mg total) by mouth 3 (three) times daily as needed for  cough.    Dispense:  40 capsule    Refill:  0    BP 130/80 mmHg  Pulse 95  Temp(Src) 98.5 F (36.9 C) (Oral)  Resp 18  Ht 5\' 6"  (1.676 m)  Wt 294 lb (133.358 kg)  BMI 47.48 kg/m2  SpO2 98% Assessment & Plan:   Patient has an upper resp.infection with a right otitis media. Will treat with amoxicillin Tessalon and Flonase

## 2014-11-26 ENCOUNTER — Ambulatory Visit
Admission: RE | Admit: 2014-11-26 | Discharge: 2014-11-26 | Disposition: A | Discharge status: Home / Self Care | Payer: 59 | Source: Ambulatory Visit | Attending: Neurosurgery | Admitting: Neurosurgery

## 2014-11-26 DIAGNOSIS — G935 Compression of brain: Secondary | ICD-10-CM

## 2014-11-29 ENCOUNTER — Other Ambulatory Visit: Payer: Self-pay

## 2014-11-29 NOTE — Telephone Encounter (Signed)
Pharm reqs RF of zolpidem. Pended.

## 2014-11-30 MED ORDER — ZOLPIDEM TARTRATE 10 MG PO TABS: 10.0000 mg | ORAL_TABLET | Freq: Every evening | ORAL | Status: DC | PRN

## 2014-12-03 NOTE — Telephone Encounter (Signed)
Faxed

## 2014-12-08 ENCOUNTER — Other Ambulatory Visit: Payer: Self-pay | Admitting: Gastroenterology

## 2014-12-25 ENCOUNTER — Encounter: Payer: Self-pay | Admitting: Neurology

## 2014-12-25 ENCOUNTER — Ambulatory Visit (INDEPENDENT_AMBULATORY_CARE_PROVIDER_SITE_OTHER): Payer: 59 | Admitting: Neurology

## 2014-12-25 VITALS — BP 139/83 | HR 88 | Resp 14 | Ht 66.25 in | Wt 299.0 lb

## 2014-12-25 DIAGNOSIS — Q0701 Arnold-Chiari syndrome with spina bifida: Secondary | ICD-10-CM | POA: Insufficient documentation

## 2014-12-25 DIAGNOSIS — F329 Major depressive disorder, single episode, unspecified: Secondary | ICD-10-CM

## 2014-12-25 DIAGNOSIS — F32A Depression, unspecified: Secondary | ICD-10-CM | POA: Insufficient documentation

## 2014-12-25 DIAGNOSIS — Q0700 Arnold-Chiari syndrome without spina bifida or hydrocephalus: Secondary | ICD-10-CM

## 2014-12-25 DIAGNOSIS — G4730 Sleep apnea, unspecified: Secondary | ICD-10-CM

## 2014-12-25 DIAGNOSIS — G44221 Chronic tension-type headache, intractable: Secondary | ICD-10-CM

## 2014-12-25 DIAGNOSIS — E662 Morbid (severe) obesity with alveolar hypoventilation: Secondary | ICD-10-CM | POA: Insufficient documentation

## 2014-12-25 DIAGNOSIS — G4710 Hypersomnia, unspecified: Secondary | ICD-10-CM

## 2014-12-25 MED ORDER — DESVENLAFAXINE SUCCINATE ER 50 MG PO TB24: 50.0000 mg | ORAL_TABLET | Freq: Every day | ORAL | Status: DC

## 2014-12-25 NOTE — Progress Notes (Signed)
Chief Complaint  Patient presents with  . NP [facility] Consult    Rm 10, alone    [facility]   Provider:  Larey Seat, M D  Referring Provider: Leandrew Koyanagi, MD Primary Care Physician:  Leandrew Koyanagi, MD  Chief Complaint  Patient presents with  . NP [facility] Consult    Rm 10, alone    HPI:  Erin Jimenez is a 44 y.o. female  Is seen here as a referral  from Dr. Sherwood Jimenez for a sleep apnea re- evaluation.  Her PCP is  Erin Jimenez , and her primary Neurologist was Dr. Jannifer Jimenez.    Erin Jimenez had been seen by Dr. 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 In addition Dr. Sherwood Jimenez noted advanced spondylosis and degenerative disc disease at the levels C3 and C4 as well as C4 and C5 this was resulting in a spinal canal stenosis. She underwent a suboccipital craniectomy upper cervical laminectomy and cranial cervical duraplasty. In addition to a C3 to speak 5 posterior cervical arthrodesis with lateral screws and rods and a bone graft. Prior to surgery she was found to have pronounced syringomyelia and follow-up MRIs have shown substantial decompression of this finding with mild residual dilatation of the central canal down to the T1 level. She has had chronic neck pain and headaches. Dr. Sherwood Jimenez and her primary care physician October Tami Lin are concerned about the possibility of her having sleep apnea she has had sleep disturbances and has relied on sleep aids for while has actually chronically used Ambien. In addition Dr. Mariea Clonts has prescribed Narco for pain which could at least in theory decrease the patient's breathing rhythm to some degree as a narcotic pain medication. Some patients will develop a complex apnea syndrome of central and obstructive apnea.  The patient reports she goes to bed between 10 and 11 at night and  usually will need to 1 AM or even longer to fall asleep. The last couple of nights have been good. This is in spite of taking 10 mg of Ambien and being on Flexeril and on Cordarone. Once she gets to sleep she reports that she could continue to sleep. She has set her alarm at 6 AM and relies on the alarm to wake her up. Usually she gets about 5 hours of nocturnal sleep. In the morning she does not feel restored or refreshed, she's excessively stiff at the neck level and has shoulder pain. She is able to sleep in supine as well as in lateral position she reports, but she relies on 2 pillows to prop her up at night. The pillows are required due to her neck pain she feels not to do any respiratory difficulties. She will have one or 2 bathroom breaks at night, and her family has reported that she snores very loud. she wakes up with a very dry mouth , too. They witnessed apneas.  She drinks 2 cups of coffee in AM and 1 small soda in PM.  She works form 8 AM to 5 PM, no shift work history, she works mostly indoors. She has daylight exposure.   Due to her extensive surgical history and the pain syndrome, she craves to nap, but cannot fall asleep in daytime.   The patient is obese , she had a lab band in 2005 and in 2014  Developed swallowing difficulties at weight 240  pounds .  Her doctor removed some of the saline from the cuff and she had weight gain after surgery , back to 300. The BMI is posing a high risk for OSA and hypoventilation in conjunction with the narcotics.   Review of Systems: Out of a complete 14 system review, the patient complains of only the following symptoms, and all other reviewed systems are negative. Headaches, numbness, difficulties with swallowing, occasional tremor, insomnia, loud snoring, constipation, nasal allergies, ringing in her ears and excessive fatigue.  I reviewed the patient's medication list, with the fatigue severity score at 43 points and the Epworth sleepiness score at  10 points.   depression score   History   Social History  . Marital Status: Married    Spouse Name: N/A    Number of Children: N/A  . Years of Education: N/A   Occupational History  . sales Museum/gallery conservator   Social History Main Topics  . Smoking status: Never Smoker   . Smokeless tobacco: Never Used  . Alcohol Use: Yes     Comment: occ  . Drug Use: No  . Sexual Activity: Not Currently   Other Topics Concern  . Not on file   Social History Narrative   Married   Sales associate [employer]   Caffeine 2 cups coffee daily   HS Grad.            Family History  Problem Relation Age of Onset  . Colon polyps Mother   . Diabetes Mother   . Lymphoma Mother     CNS  . Colon polyps Father   . Lymphoma Father   . Cancer Father   . Colon cancer Neg Hx   . Rectal cancer Neg Hx   . Stomach cancer Neg Hx     Past Medical History  Diagnosis Date  . Irritable bowel syndrome   . Chronic headache   . Chiari malformation   . GERD (gastroesophageal reflux disease)   . Endometriosis   . Obesity   . Depression   . B12 deficiency   . Insomnia   . Pseudomeningocele, acquired     Past Surgical History  Procedure Laterality Date  . Abdominal hysterectomy  2001  . Laparoscopic gastric banding  2006  . Spinal fusion    . Cholecystectomy    . Brain surgery      Chiari malformation  . Ercp  2000    Current Outpatient Prescriptions  Medication Sig Dispense Refill  . buPROPion (WELLBUTRIN SR) 150 MG 12 hr tablet Take 150 mg by mouth daily.    . cholecalciferol (VITAMIN D) 1000 UNITS tablet Take 2,000 Units by mouth daily.    . cyanocobalamin (,VITAMIN B-12,) 1000 MCG/ML injection Inject 1 mL (1,000 mcg total) into the muscle every 30 (thirty) days. 25 mL 0  . cyclobenzaprine (FLEXERIL) 10 MG tablet Take 10 mg by mouth at bedtime.    . dicyclomine (BENTYL) 10 MG capsule Take 1 capsule (10 mg total) by mouth 3 (three) times daily as needed (For cramping ). 30 capsule 2  .  Est Estrogens-Methyltest (ESTRATEST PO) Take by mouth. Takes 1.5 tab daily.    . fluticasone (FLONASE) 50 MCG/ACT nasal spray Place 2 sprays into both nostrils daily. 16 g 6  . LINZESS 145 MCG CAPS capsule TAKE ONE CAPSULE BY MOUTH EVERY DAY 30 capsule 1  . Loratadine (CLARITIN PO) Take 20 tablets by mouth daily.     . naproxen sodium (ANAPROX) 220 MG tablet  Take 220 mg by mouth. Takes 4-5 tabs daily.    . pantoprazole (PROTONIX) 40 MG tablet Take 1 tablet (40 mg total) by mouth daily before breakfast. 90 tablet 3  . fluticasone (FLONASE) 50 MCG/ACT nasal spray Place 2 sprays into the nose daily. Taking as needed    . HYDROCODONE-ACETAMINOPHEN PO Take by mouth. 5-325MG  TABS, TAKES 1.5 TAB QHS    . zolpidem (AMBIEN) 10 MG tablet Take 1 tablet (10 mg total) by mouth at bedtime as needed for sleep. (Patient not taking: Reported on 12/25/2014) 90 tablet 0   No current facility-administered medications for this visit.    Allergies as of 12/25/2014 - Review Complete 12/25/2014  Allergen Reaction Noted  . Ciprofloxacin Nausea Only   . Doxycycline Nausea Only   . Oxycodone Nausea Only   . Sulfa antibiotics Nausea Only     Vitals: BP 139/83 mmHg  Pulse 88  Resp 14  Ht 5' 6.25" (1.683 m)  Wt 299 lb (135.626 kg)  BMI 47.88 kg/m2 Last Weight:  Wt Readings from Last 1 Encounters:  12/25/14 299 lb (135.626 kg)       Last Height:   Ht Readings from Last 1 Encounters:  12/25/14 5' 6.25" (1.683 m)    Physical exam:  General: The patient is awake, alert and appears not in acute distress. The patient is well groomed. Head: Normocephalic, atraumatic. Neck is supple. Mallampati 3 , small mouth , crowded dental status, bruxism marks,   neck circumference:19 inches . Nasal airflow : slight restriction , TMJ is evident . Retrognathia is not seen.  Cardiovascular:  Regular rate and rhythm , without  murmurs or carotid bruit, and without distended neck veins. Respiratory: Lungs are clear to  auscultation. Skin:  Without evidence of edema, or rash Trunk: BMI is elevated and patient  has normal posture.  Neurologic exam : The patient is awake and alert, oriented to place and time.   Memory subjective described as intact. There is a normal attention span & concentration ability. Speech is fluent without dysarthria, dysphonia or aphasia. Mood and affect are appropriate.  Cranial nerves: Pupils are equal and briskly reactive to light. Funduscopic exam without  evidence of pallor or edema. Right eye abductor weakness.  Extraocular movements  in vertical and horizontal planes are with  nystagmus. Visual fields by finger perimetry are intact. Hearing to finger rub intact.  Right ear tinnitus,  Facial sensation intact to fine touch. Facial motor strength: right mild facial droop. , tongue and uvula move midline.  Motor exam:   Normal tone, muscle bulk and symmetric strength in lower l extremities. Has stiffness and very high tesnion at the neck, shoulder and posterior chest wall.  ROM restricted.   Sensory:  Fine touch, pinprick and vibration were tested in all extremities. Proprioception is tested in the upper extremities only. This was normal.  Coordination: Rapid alternating movements in the fingers/hands is normal. Finger-to-nose maneuver  normal without evidence of ataxia, dysmetria  But mild essential tremor.  Gait and station: Patient walks without assistive device and is able unassisted to climb up to the exam table.  Strength within normal limits. Stance is stable and normal. Tandem gait is unfragmented.  Deep tendon reflexes: in the  upper and lower extremities are symmetric and intact. Babinski maneuver response is  downgoing.   Assessment:  After physical and neurologic examination, review of laboratory studies, imaging, neurophysiology testing and pre-existing records, assessment is   1) Related  to the patients extensive  surgical history , her neck pain and spasm, the  stiffness and discomfort are expected.   2)She gained weight, needs to considier reapplication of NaCl to her lab band. OSA main risk factor.  Large neck and changed anatomy of the neck.   3) the patient is at higher risk for central and obstructive apnea, based on the narcotic pain medication and the BMI, causing her to retain Co2, this in return causes headaches and fatigue , rather than sleepiness. Snoring and irregular breathing were witnessed.    The patient was advised of the nature of the diagnosed sleep disorder , the treatment options and risks for general a health and wellness arising from not treating the condition. Visit duration was 45 minutes. More than 50% of my time in face to face consultation were spent with informing the patient of her presumed diagnosis, the tests to order and the risk factors to manage.  The patient will follow up after split study.   Plan:  Treatment plan and additional workup :  Split with Co2,  [employer] AHI 20 , score at 4 %, high risk for hypercapnia, Obesity hypoventilation, _  abnormal muscle tone and sedative sleep aids will contribute further .  Patient will bring her medication to the sleep lab. Chronic insomnia on sleep aids. Will refer to behavior therapy,  Instruction given.  Weight loss and re -filling of the lab band through Garment/textile technologist.       Asencion Partridge Nikie Cid MD  12/25/2014

## 2014-12-25 NOTE — Patient Instructions (Signed)
Insomnia Insomnia is frequent trouble falling and/or staying asleep. Insomnia can be a long term problem or a short term problem. Both are common. Insomnia can be a short term problem when the wakefulness is related to a certain stress or worry. Long term insomnia is often related to ongoing stress during waking hours and/or poor sleeping habits. Overtime, sleep deprivation itself can make the problem worse. Every little thing feels more severe because you are overtired and your ability to cope is decreased. CAUSES   Stress, anxiety, and depression.  Poor sleeping habits.  Distractions such as TV in the bedroom.  Naps close to bedtime.  Engaging in emotionally charged conversations before bed.  Technical reading before sleep.  Alcohol and other sedatives. They may make the problem worse. They can hurt normal sleep patterns and normal dream activity.  Stimulants such as caffeine for several hours prior to bedtime.  Pain syndromes and shortness of breath can cause insomnia.  Exercise late at night.  Changing time zones may cause sleeping problems (jet lag). It is sometimes helpful to have someone observe your sleeping patterns. They should look for periods of not breathing during the night (sleep apnea). They should also look to see how long those periods last. If you live alone or observers are uncertain, you can also be observed at a sleep clinic where your sleep patterns will be professionally monitored. Sleep apnea requires a checkup and treatment. Give your caregivers your medical history. Give your caregivers observations your family has made about your sleep.  SYMPTOMS   Not feeling rested in the morning.  Anxiety and restlessness at bedtime.  Difficulty falling and staying asleep. TREATMENT   Your caregiver may prescribe treatment for an underlying medical disorders. Your caregiver can give advice or help if you are using alcohol or other drugs for self-medication. Treatment  of underlying problems will usually eliminate insomnia problems.  Medications can be prescribed for short time use. They are generally not recommended for lengthy use.  Over-the-counter sleep medicines are not recommended for lengthy use. They can be habit forming.  You can promote easier sleeping by making lifestyle changes such as:  Using relaxation techniques that help with breathing and reduce muscle tension.  Exercising earlier in the day.  Changing your diet and the time of your last meal. No night time snacks.  Establish a regular time to go to bed.  Counseling can help with stressful problems and worry.  Soothing music and white noise may be helpful if there are background noises you cannot remove.  Stop tedious detailed work at least one hour before bedtime. HOME CARE INSTRUCTIONS   Keep a diary. Inform your caregiver about your progress. This includes any medication side effects. See your caregiver regularly. Take note of:  Times when you are asleep.  Times when you are awake during the night.  The quality of your sleep.  How you feel the next day. This information will help your caregiver care for you.  Get out of bed if you are still awake after 15 minutes. Read or do some quiet activity. Keep the lights down. Wait until you feel sleepy and go back to bed.  Keep regular sleeping and waking hours. Avoid naps.  Exercise regularly.  Avoid distractions at bedtime. Distractions include watching television or engaging in any intense or detailed activity like attempting to balance the household checkbook.  Develop a bedtime ritual. Keep a familiar routine of bathing, brushing your teeth, climbing into bed at the same   time each night, listening to soothing music. Routines increase the success of falling to sleep faster.  Use relaxation techniques. This can be using breathing and muscle tension release routines. It can also include visualizing peaceful scenes. You can  also help control troubling or intruding thoughts by keeping your mind occupied with boring or repetitive thoughts like the old concept of counting sheep. You can make it more creative like imagining planting one beautiful flower after another in your backyard garden.  During your day, work to eliminate stress. When this is not possible use some of the previous suggestions to help reduce the anxiety that accompanies stressful situations. MAKE SURE YOU:   Understand these instructions.  Will watch your condition.  Will get help right away if you are not doing well or get worse. Document Released: 11/27/2000 Document Revised: 02/22/2012 Document Reviewed: 12/28/2007 ExitCare Patient Information 2015 [employer]. This information is not intended to replace advice given to you by your health care provider. Make sure you discuss any questions you have with your health care provider.  

## 2014-12-26 ENCOUNTER — Telehealth: Payer: Self-pay | Admitting: Neurology

## 2014-12-26 NOTE — Telephone Encounter (Signed)
All required info has been forwarded to ins.  Request is currently pending.  I called back.  She is aware.

## 2014-12-26 NOTE — Telephone Encounter (Signed)
Patient stated Pharmacy requesting prior authorization for Rx desvenlafaxine (PRISTIQ) 50 MG 24 hr tablet.  Please call and advise.

## 2014-12-31 ENCOUNTER — Telehealth: Payer: Self-pay

## 2014-12-31 NOTE — Telephone Encounter (Signed)
[facility] has approved the request for coverage on Pristiq effective until 12/29/2016 Ref # [place] [employer] [id number]

## 2015-01-25 ENCOUNTER — Ambulatory Visit (INDEPENDENT_AMBULATORY_CARE_PROVIDER_SITE_OTHER): Payer: 59 | Admitting: Neurology

## 2015-01-25 ENCOUNTER — Encounter: Payer: Self-pay | Admitting: Neurology

## 2015-01-25 VITALS — BP 133/80 | HR 89

## 2015-01-25 DIAGNOSIS — G4733 Obstructive sleep apnea (adult) (pediatric): Secondary | ICD-10-CM

## 2015-01-26 NOTE — Sleep Study (Signed)
Please see the scanned sleep study interpretation located in the Procedure tab within the Chart Review section. 

## 2015-02-05 ENCOUNTER — Other Ambulatory Visit: Payer: Self-pay | Admitting: Neurology

## 2015-02-05 DIAGNOSIS — Z9989 Dependence on other enabling machines and devices: Secondary | ICD-10-CM | POA: Insufficient documentation

## 2015-02-05 DIAGNOSIS — G4733 Obstructive sleep apnea (adult) (pediatric): Secondary | ICD-10-CM

## 2015-02-05 DIAGNOSIS — G4731 Primary central sleep apnea: Secondary | ICD-10-CM

## 2015-02-06 ENCOUNTER — Encounter: Payer: Self-pay | Admitting: Neurology

## 2015-02-11 ENCOUNTER — Other Ambulatory Visit: Payer: Self-pay | Admitting: Gastroenterology

## 2015-02-18 ENCOUNTER — Telehealth: Payer: Self-pay | Admitting: Neurology

## 2015-02-18 MED ORDER — DESVENLAFAXINE SUCCINATE ER 50 MG PO TB24: 50.0000 mg | ORAL_TABLET | Freq: Every day | ORAL | Status: DC

## 2015-02-18 NOTE — Telephone Encounter (Signed)
Patient requesting a 90 supply for Rx desvenlafaxine (PRISTIQ) 50 MG 24 hr tablet [facility] to [facility] [address], due to insurance will not pay for 30 day supply.  Please call and advise.

## 2015-02-18 NOTE — Telephone Encounter (Signed)
Rx has been sent for 90 days per patient request.  I called back, got no answer.  Left message.

## 2015-03-04 ENCOUNTER — Ambulatory Visit: Payer: 59 | Admitting: Neurology

## 2015-03-15 ENCOUNTER — Other Ambulatory Visit: Payer: Self-pay | Admitting: Internal Medicine

## 2015-03-18 NOTE — Telephone Encounter (Signed)
Called in.

## 2015-04-05 ENCOUNTER — Telehealth: Payer: Self-pay

## 2015-04-05 NOTE — Telephone Encounter (Signed)
Called pt to remind her to bring cpap to appt and inquire as to who her machine supplier is. No answer, left a message.

## 2015-04-09 ENCOUNTER — Ambulatory Visit: Payer: 59 | Admitting: Neurology

## 2015-04-09 NOTE — Telephone Encounter (Signed)
This patient should be on AutoPap, which means a pressure from 5-15 cm, per Dr. Brett Fairy. We would really have to look at a download to see how she is doing. Please call [facility] to get a AutoPap compliance download for the last 30 days. We can take it from there.

## 2015-04-09 NOTE — Telephone Encounter (Signed)
Spoke to pt today re: her question about not being able to get to sleep. Dr.Dohmeier out of office, so Dr. Rexene Alberts recommended after viewing pt's therapy report from cpap for the last thirty days to keep doing whatever she is doing because her AHI looks good for the last 9 days. Pt reports being on vacation for the last 9 days. Dr. Rexene Alberts recommended keeping the May appt with Dr. Brett Fairy to talk about possibly adjusting settings if pt is still having trouble sleeping. Pt agreeable to plan.

## 2015-04-09 NOTE — Telephone Encounter (Signed)
Dr. Brett Fairy out office sick today, we had to r/s her appt until 04/18/15, pt's DME company is [employer], CPAP machine set at level 5, pt is having a hard time sleeping, can she increase the settings, pt wants a phone call back today.

## 2015-04-11 ENCOUNTER — Other Ambulatory Visit: Payer: Self-pay | Admitting: Internal Medicine

## 2015-04-18 ENCOUNTER — Ambulatory Visit: Payer: Self-pay | Admitting: Neurology

## 2015-04-26 ENCOUNTER — Ambulatory Visit (INDEPENDENT_AMBULATORY_CARE_PROVIDER_SITE_OTHER): Payer: 59 | Admitting: Neurology

## 2015-04-26 ENCOUNTER — Encounter: Payer: Self-pay | Admitting: Neurology

## 2015-04-26 VITALS — BP 118/64 | HR 70 | Resp 18 | Ht 67.0 in | Wt 300.0 lb

## 2015-04-26 DIAGNOSIS — G4737 Central sleep apnea in conditions classified elsewhere: Secondary | ICD-10-CM

## 2015-04-26 DIAGNOSIS — G4731 Primary central sleep apnea: Secondary | ICD-10-CM

## 2015-04-26 DIAGNOSIS — G4733 Obstructive sleep apnea (adult) (pediatric): Secondary | ICD-10-CM | POA: Diagnosis not present

## 2015-04-26 DIAGNOSIS — E662 Morbid (severe) obesity with alveolar hypoventilation: Secondary | ICD-10-CM | POA: Diagnosis not present

## 2015-04-26 NOTE — Progress Notes (Signed)
Chief Complaint  Patient presents with  . Follow-up    cpap, rm 10, alone    [facility]   Provider:  Larey Seat, M D  Referring Provider: Leandrew Koyanagi, MD Primary Care Physician:  Leandrew Koyanagi, MD  Chief Complaint  Patient presents with  . Follow-up    cpap, rm 10, alone    HPI:  Erin Jimenez is a 44 y.o. female  Is seen here as a referral  from Dr. Sherwood Gambler for a sleep apnea re- evaluation.  Her PCP is  Laney Pastor , and her primary Neurologist was Dr. Jannifer Franklin.   Mrs. Colborn had been seen  Dr. 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  In addition Dr. Sherwood Gambler noted advanced spondylosis and degenerative disc disease at the levels C3 and C4 as well as C4 and C5 - this was resulting in a spinal canal stenosis.  She underwent a suboccipital craniectomy upper cervical laminectomy and cranial cervical duraplasty.  In addition,  to a C3 to C5 posterior cervical arthrodesis with lateral screws and rods and a bone graft. Prior to surgery she was found to have pronounced syringomyelia and follow-up MRIs have shown substantial decompression of this finding with mild residual dilatation of the central canal down to the T1 level. She has had chronic neck pain and headaches. Dr. Sherwood Gambler and her primary care physician Dr.  Tami Lin are concerned about the possibility of her having sleep apnea.  She has had sleep disturbances and has relied on sleep aids for while has actually chronically used Ambien. In addition Dr. Mariea Clonts has prescribed Narco for pain which could at least in theory decrease the patient's breathing rhythm to some degree as a narcotic pain medication.  Some patients will develop a complex apnea syndrome of central and obstructive apnea.  The patient reports she goes to bed between 10 and 11 at night and usually will need to 1 AM or even longer to fall asleep.  The last couple of nights have been good. This is in spite of taking 10 mg of Ambien and being on Flexeril and on Cordarone. Once she gets to sleep she reports that she could continue to sleep. She has set her alarm at 6 AM and relies on the alarm to wake her up. Usually she gets about 5 hours of nocturnal sleep. In the morning she does not feel restored or refreshed, she's excessively stiff at the neck level and has shoulder pain. She is able to sleep in supine as well as in lateral position she reports, but she relies on 2 pillows to prop her up at night. The pillows are required due to her neck pain she feels not to do any respiratory difficulties. She will have one or 2 bathroom breaks at night, and her family has reported that she snores very loud. she wakes up with a very dry mouth , too. They witnessed apneas.  She drinks 2 cups of coffee in AM and 1 small soda in PM.  She works form 8 AM to 5 PM, no shift work history, she works mostly indoors. She has daylight exposure. Due to her extensive surgical history and the pain syndrome, she craves to nap, but cannot fall asleep in daytime.   The patient is obese , she had a lab band in 2005 and in 2014  Developed swallowing difficulties at weight 240 pounds .  Her doctor removed some of the saline from the cuff and she had weight gain after surgery , back to 300. The BMI is posing a high risk for OSA and hypoventilation in conjunction with the narcotics.    Several history from 04-26-15  Mrs. Iezzi was evaluated in a split-night polysomnography as ordered in her last visit with me. The patient was diagnosed with obstructive sleep apnea or complex apnea at an AHI of 34.3 and an RDI of 42.9. REM sleep was not seen CO2 was retained which would allow for the diagnosis of hypoventilation syndrome. There were few periodic limb movements there was no cardiac irregularity seen. Patient was titrated to 13 cm water but did best at 10 cm water pressure. Her oxygen  nadir rose to 87% was only 1.6 minutes of desaturation and to my surprise a full face mask and a nasal mask were used by the technologist attending her study. I adjusted or advised to try to change to a nasal pillow. She states that the pillow works very well for her. I'm also pretty today to her download. The patient used to machine 100% of days and 67% of the time at over 4 hours. Her average daily usage is 5 hours 11 minutes, the minimum pressure for the Ultracet is 5 and maximum pressure of 15 cm water she is an EPR of 3. The 95th percentile pressure is 12.7 cm water. I feel that we should leave her on an auto titrate her and not set the machine to 1 specific pressure as this allows her also to have the necessary adjustments while  she is trying to lose weight.  She reports still having difficulties to fall asleep.   Review of Systems: Out of a complete 14 system review, the patient complains of only the following symptoms, and all other reviewed systems are negative. Headaches have improved but not  numbness,  difficulties with swallowing, occasional tremor,  Improved insomnia,  Improved ( controlled ) loud snoring, constipation, nasal allergies, ringing in her ears and  fatigue.  I reviewed the patient's medication list, with the fatigue severity score at  34 from 43 points and the Epworth sleepiness score remained  at 10 points.     History   Social History  . Marital Status: Married    Spouse Name: N/A  . Number of Children: N/A  . Years of Education: N/A   Occupational History  . sales Museum/gallery conservator   Social History Main Topics  . Smoking status: Never Smoker   . Smokeless tobacco: Never Used  . Alcohol Use: Yes     Comment: occ  . Drug Use: No  . Sexual Activity: Not Currently   Other Topics Concern  . Not on file   Social History Narrative   Married   Sales associate [employer]   Caffeine 2 cups coffee daily   HS Grad.            Family History  Problem  Relation Age of Onset  . Colon polyps Mother   . Diabetes Mother   . Lymphoma Mother     CNS  . Colon polyps Father   . Lymphoma Father   . Cancer Father   . Colon cancer Neg Hx   . Rectal cancer Neg Hx   . Stomach cancer Neg Hx     Past Medical History  Diagnosis Date  . Irritable bowel syndrome   . Chronic headache   . Chiari malformation   .  GERD (gastroesophageal reflux disease)   . Endometriosis   . Obesity   . Depression   . B12 deficiency   . Insomnia   . Pseudomeningocele, acquired     Past Surgical History  Procedure Laterality Date  . Abdominal hysterectomy  2001  . Laparoscopic gastric banding  2006  . Spinal fusion    . Cholecystectomy    . Brain surgery      Chiari malformation  . Ercp  2000    Current Outpatient Prescriptions  Medication Sig Dispense Refill  . buPROPion (WELLBUTRIN SR) 150 MG 12 hr tablet Take 150 mg by mouth daily.    . cholecalciferol (VITAMIN D) 1000 UNITS tablet Take 2,000 Units by mouth daily.    . cyanocobalamin (,VITAMIN B-12,) 1000 MCG/ML injection Inject 1 mL (1,000 mcg total) into the muscle every 30 (thirty) days. 25 mL 0  . cyclobenzaprine (FLEXERIL) 10 MG tablet Take 10 mg by mouth at bedtime.    Marland Kitchen desvenlafaxine (PRISTIQ) 50 MG 24 hr tablet Take 1 tablet (50 mg total) by mouth daily. 90 tablet 1  . dicyclomine (BENTYL) 10 MG capsule Take 1 capsule (10 mg total) by mouth 3 (three) times daily as needed (For cramping ). 30 capsule 2  . Est Estrogens-Methyltest (ESTRATEST PO) Take by mouth. Takes 1.5 tab daily.    . fluticasone (FLONASE) 50 MCG/ACT nasal spray Place 2 sprays into both nostrils daily. 16 g 6  . HYDROCODONE-ACETAMINOPHEN PO Take by mouth. 5-325MG  TABS, TAKES 1.5 TAB QHS    . LINZESS 145 MCG CAPS capsule TAKE ONE CAPSULE BY MOUTH EVERY DAY 30 capsule 1  . Loratadine (CLARITIN PO) Take 20 tablets by mouth daily.     . naproxen sodium (ANAPROX) 220 MG tablet Take 220 mg by mouth. Takes 4-5 tabs daily.    .  pantoprazole (PROTONIX) 40 MG tablet Take 1 tablet (40 mg total) by mouth daily before breakfast. 90 tablet 3  . zolpidem (AMBIEN) 10 MG tablet TAKE 1 TABLET BY MOUTH AT BEDTIME AS NEEDED FOR SLEEP 90 tablet 0  . fluticasone (FLONASE) 50 MCG/ACT nasal spray Place 2 sprays into the nose daily. Taking as needed     No current facility-administered medications for this visit.    Allergies as of 04/26/2015 - Review Complete 04/26/2015  Allergen Reaction Noted  . Ciprofloxacin Nausea Only   . Doxycycline Nausea Only   . Oxycodone Nausea Only   . Sulfa antibiotics Nausea Only     Vitals: BP 118/64 mmHg  Pulse 70  Resp 18  Ht 5\' 7"  (1.702 m)  Wt 300 lb (136.079 kg)  BMI 46.98 kg/m2 Last Weight:  Wt Readings from Last 1 Encounters:  04/26/15 300 lb (136.079 kg)       Last Height:   Ht Readings from Last 1 Encounters:  04/26/15 5\' 7"  (1.702 m)    Physical exam:  General: The patient is awake, alert and appears not in acute distress. The patient is well groomed. Head: Normocephalic, atraumatic. Neck is supple. Mallampati 3 , small mouth , crowded dental status, bruxism marks,   neck circumference:19 inches . Nasal airflow : slight restriction , TMJ is evident . Retrognathia is not seen.  Cardiovascular:  Regular rate and rhythm , without  murmurs or carotid bruit, and without distended neck veins. Respiratory: Lungs are clear to auscultation. Skin:  Without evidence of edema, or rash Trunk: BMI is elevated , the patient has a webbed neck, lack of a neck, really.  Neurologic exam : The patient is awake and alert, oriented to place and time.   Memory subjective described as intact. There is a normal attention span & concentration ability.  Speech is fluent without dysarthria, dysphonia or aphasia. Mood and affect are appropriate.  Cranial nerves: Pupils are equal and briskly reactive to light. Funduscopic exam without  evidence of pallor or edema. Right eye abductor weakness.    Extraocular movements  in vertical and horizontal planes are with  nystagmus. Visual fields by finger perimetry are intact. Hearing to finger rub intact. Facial sensation intact to fine touch. Facial motor strength: right , very mild facial droop.  Tongue and uvula move midline. Motor exam:   Normal tone, muscle bulk and symmetric strength in lower l extremities.  Has stiffness and very high tesnion at the neck, shoulder and posterior chest wall.  ROM restricted.  Sensory:  Fine touch, pinprick and vibration were tested in all extremities. Proprioception is tested in the upper extremities only. This was normal. Coordination: Rapid alternating movements in the fingers/hands is normal. Finger-to-nose maneuver  normal without evidence of ataxia, dysmetria  But mild essential tremor. Gait and station: Patient walks without assistive device and is able unassisted to climb up to the exam table.  Strength within normal limits. Stance is stable and normal. Tandem gait is unfragmented.  Deep tendon reflexes: in the  upper and lower extremities are symmetric and intact. Babinski maneuver response is  downgoing.   Assessment:  After physical and neurologic examination, review of laboratory studies, imaging, neurophysiology testing and pre-existing records, assessment is   1) Related  to the patients extensive surgical history , her neck pain and spasm, the stiffness and discomfort are expected.   2) the patient was diagnosed with Co2 retention,  central and obstructive apnea.  based on the narcotic pain medication and the BMI, causing her to retain Co2, this in return causes headaches and fatigue , rather than sleepiness.  Snoring and irregular breathing were witnessed.   The current CPAP therapy addresses the snoring, the irregular breathing and also decreased her allergic rhinitis. It improves airflow overall and it has allowed her control of central and obstructive sleep apneas. The patient is aware  about the risk factors related to her body mass index for obstructive sleep apnea but also due to her history of Arnold-Chiari malformation and the pain medications she is taking an elevated risk of central apnea is present. It seems that with the current setting she is doing rather well. CO2 retention should have been corrected by now and hypoxemia has been adequately treated and demonstrated as such during her split-night polysomnography.   The patient was advised of the nature of the diagnosed sleep disorder , the treatment options and risks for general a health and wellness arising from not treating the condition.  Visit duration was 30 minutes. More than 50% of my time in face to face consultation were spent with informing the patient of herdiagnosis, the risk factors to manage.  Plan:  Treatment plan and additional workup : reduce ambien to half tab.  Continue auto-titration, 5-15 cm water with a nasal pillow, airtfit P10 Weight loss and re -filling of the lab band through Garment/textile technologist.    Asencion Partridge Romey Cohea MD  04/26/2015

## 2015-05-24 ENCOUNTER — Telehealth: Payer: Self-pay | Admitting: Neurology

## 2015-05-24 NOTE — Telephone Encounter (Signed)
[facility] returning [place] RN's call to let her know that everything was good to go with the patients CPAP machine.

## 2015-06-06 ENCOUNTER — Other Ambulatory Visit: Payer: Self-pay | Admitting: Internal Medicine

## 2015-06-11 ENCOUNTER — Other Ambulatory Visit: Payer: Self-pay | Admitting: Gastroenterology

## 2015-06-14 ENCOUNTER — Telehealth: Payer: Self-pay | Admitting: Neurology

## 2015-06-14 NOTE — Telephone Encounter (Signed)
Patient is calling to discuss medication desvenlafaxine (PRISTIQ) 50 MG 24 hr tablet. The patient states her hair is coming out by the handfuls. The patient also has questions about her CPAP machine. Please call.

## 2015-06-18 NOTE — Telephone Encounter (Signed)
Pt is calling concerned about her pristiq causing hair loss. She says it is coming out in handfuls. She has increased her protein consumption and it seems to have slowed the hair loss, but she still wants Dr. Edwena Felty opinion. I advised her NOT to stop pristiq suddenly, advised her to see her PCP to check her thyroid panel. She is aware that Dr. Brett Fairy is out of the office until next week and that I will call her back then.

## 2015-06-24 ENCOUNTER — Telehealth: Payer: Self-pay

## 2015-06-24 NOTE — Telephone Encounter (Signed)
Returned pt's call regarding pristiq. Dr. Brett Fairy does not think the pristiq is causing hair loss. She advised pt to follow up with her PCP to check thyroid and to NOT stop taking pristiq. Pt verbalized understanding. Pt asked if Dr. Brett Fairy is willing to see pt regarding her chiari malformation. Dr. Brett Fairy said this is fine. I advised pt to get a referral from her PCP to Dr. Brett Fairy regarding the chiari. Left message on pt's home phone (per DPR).

## 2015-06-25 ENCOUNTER — Telehealth: Payer: Self-pay

## 2015-06-25 DIAGNOSIS — IMO0002 Reserved for concepts with insufficient information to code with codable children: Secondary | ICD-10-CM

## 2015-06-25 NOTE — Telephone Encounter (Signed)
Pt is needing a referral with a neurology from dr Laney Pastor  Best number [phone]

## 2015-06-25 NOTE — Telephone Encounter (Signed)
She usually sees Dr. Brett Fairy--- is there some problem with this? If she needs another neurologist would she be happy going to [place] or [facility]?

## 2015-06-26 NOTE — Telephone Encounter (Signed)
Called pt, she states she needs another referral for Chiari to Dr. Brett Fairy. She states she needs a separate referral for insurance purposes.

## 2015-07-16 ENCOUNTER — Telehealth: Payer: Self-pay

## 2015-07-16 ENCOUNTER — Other Ambulatory Visit: Payer: Self-pay | Admitting: Internal Medicine

## 2015-07-16 NOTE — Telephone Encounter (Signed)
Pt called about getting her B12 refilled.  Please advise [phone]

## 2015-07-16 NOTE — Telephone Encounter (Signed)
Last B12 was done 05/03/2015. Please advise.

## 2015-07-16 NOTE — Telephone Encounter (Signed)
It's been more than a year since she's been seen. She should rtc for recheck.

## 2015-07-17 NOTE — Telephone Encounter (Signed)
Spoke with pt, advised message from Mario. Pt understood. 

## 2015-07-20 ENCOUNTER — Ambulatory Visit (INDEPENDENT_AMBULATORY_CARE_PROVIDER_SITE_OTHER): Payer: 59 | Admitting: Physician Assistant

## 2015-07-20 VITALS — BP 122/72 | HR 97 | Temp 98.2°F | Resp 17 | Ht 65.5 in | Wt 302.0 lb

## 2015-07-20 DIAGNOSIS — Z0000 Encounter for general adult medical examination without abnormal findings: Secondary | ICD-10-CM | POA: Diagnosis not present

## 2015-07-20 DIAGNOSIS — J069 Acute upper respiratory infection, unspecified: Secondary | ICD-10-CM

## 2015-07-20 DIAGNOSIS — B9789 Other viral agents as the cause of diseases classified elsewhere: Principal | ICD-10-CM

## 2015-07-20 DIAGNOSIS — Z8349 Family history of other endocrine, nutritional and metabolic diseases: Secondary | ICD-10-CM

## 2015-07-20 DIAGNOSIS — Z8489 Family history of other specified conditions: Secondary | ICD-10-CM | POA: Diagnosis not present

## 2015-07-20 DIAGNOSIS — Z139 Encounter for screening, unspecified: Secondary | ICD-10-CM

## 2015-07-20 DIAGNOSIS — Z760 Encounter for issue of repeat prescription: Secondary | ICD-10-CM | POA: Diagnosis not present

## 2015-07-20 LAB — POCT URINALYSIS DIPSTICK
Bilirubin, UA: NEGATIVE
Glucose, UA: NEGATIVE
Ketones, UA: NEGATIVE
Leukocytes, UA: NEGATIVE
Nitrite, UA: NEGATIVE
Protein, UA: 30
Spec Grav, UA: 1.025
Urobilinogen, UA: 0.2
pH, UA: 6

## 2015-07-20 LAB — CBC WITH DIFFERENTIAL/PLATELET
Basophils Absolute: 0 10*3/uL (ref 0.0–0.1)
Basophils Relative: 0 % (ref 0–1)
Eosinophils Absolute: 0.2 10*3/uL (ref 0.0–0.7)
Eosinophils Relative: 2 % (ref 0–5)
HCT: 38.5 % (ref 36.0–46.0)
Hemoglobin: 12.8 g/dL (ref 12.0–15.0)
Lymphocytes Relative: 23 % (ref 12–46)
Lymphs Abs: 2 10*3/uL (ref 0.7–4.0)
MCH: 27.5 pg (ref 26.0–34.0)
MCHC: 33.2 g/dL (ref 30.0–36.0)
MCV: 82.8 fL (ref 78.0–100.0)
MPV: 11.1 fL (ref 8.6–12.4)
Monocytes Absolute: 0.7 10*3/uL (ref 0.1–1.0)
Monocytes Relative: 8 % (ref 3–12)
Neutro Abs: 5.8 10*3/uL (ref 1.7–7.7)
Neutrophils Relative %: 67 % (ref 43–77)
Platelets: 310 10*3/uL (ref 150–400)
RBC: 4.65 MIL/uL (ref 3.87–5.11)
RDW: 14.8 % (ref 11.5–15.5)
WBC: 8.6 10*3/uL (ref 4.0–10.5)

## 2015-07-20 LAB — HEMOGLOBIN A1C
Hgb A1c MFr Bld: 5.8 % — ABNORMAL HIGH (ref <5.7)
Mean Plasma Glucose: 120 mg/dL — ABNORMAL HIGH (ref <117)

## 2015-07-20 LAB — COMPLETE METABOLIC PANEL WITH GFR
ALT: 23 U/L (ref 6–29)
AST: 17 U/L (ref 10–35)
Albumin: 4 g/dL (ref 3.6–5.1)
Alkaline Phosphatase: 99 U/L (ref 33–115)
BUN: 8 mg/dL (ref 7–25)
CO2: 25 mmol/L (ref 20–31)
Calcium: 8.9 mg/dL (ref 8.6–10.2)
Chloride: 103 mmol/L (ref 98–110)
Creat: 1.03 mg/dL (ref 0.50–1.10)
GFR, Est African American: 75 mL/min (ref >=60)
GFR, Est Non African American: 65 mL/min (ref >=60)
Glucose, Bld: 78 mg/dL (ref 65–99)
Potassium: 4.3 mmol/L (ref 3.5–5.3)
Sodium: 140 mmol/L (ref 135–146)
Total Bilirubin: 0.5 mg/dL (ref 0.2–1.2)
Total Protein: 6.9 g/dL (ref 6.1–8.1)

## 2015-07-20 LAB — HIV ANTIBODY (ROUTINE TESTING W REFLEX): HIV 1&2 Ab, 4th Generation: NONREACTIVE

## 2015-07-20 LAB — RPR

## 2015-07-20 LAB — VITAMIN B12: Vitamin B-12: 324 pg/mL (ref 211–911)

## 2015-07-20 LAB — TSH: TSH: 1.746 u[IU]/mL (ref 0.350–4.500)

## 2015-07-20 LAB — FOLATE: Folate: 5.2 ng/mL

## 2015-07-20 MED ORDER — ZOLPIDEM TARTRATE 10 MG PO TABS: 10.0000 mg | ORAL_TABLET | Freq: Every evening | ORAL | Status: DC | PRN

## 2015-07-20 MED ORDER — CYANOCOBALAMIN 1000 MCG/ML IJ SOLN: 1000.0000 ug | INTRAMUSCULAR | Status: DC

## 2015-07-20 MED ORDER — HYDROCODONE-HOMATROPINE 5-1.5 MG/5ML PO SYRP: 2.5000 mL | ORAL_SOLUTION | Freq: Four times a day (QID) | ORAL | Status: DC | PRN

## 2015-07-20 NOTE — Progress Notes (Signed)
07/20/2015 at 2:16 PM  Erin Jimenez / DOB: 1971-02-07 / MRN: [id number]  The patient has OBESITY, UNSPECIFIED; DEPRESSION; CONSTIPATION; IRRITABLE BOWEL SYNDROME; ENDOMETRIOSIS; Pseudomeningocele, acquired; GERD (gastroesophageal reflux disease); Allergic rhinitis due to pollen; Insomnia; B12 deficiency; BMI 40.0-44.9, adult; Chiari malformation; Dysphagia; IBS (irritable bowel syndrome); Obesity hypoventilation syndrome; Depression (emotion); Hypersomnia with sleep apnea; Chronic tension-type headache, intractable; Arnold-Chiari malformation, type II; OSA (obstructive sleep apnea); Complex sleep apnea syndrome; and Hypoventilation associated with obesity syndrome on her problem list.  SUBJECTIVE  Chief complaint: Cough and URI  Erin Jimenez is a 45 y.o. female complaining of dry cough, nasal blockage, post nasal drip and sinus and nasal congestion that started 7 days ago.  Associated symptoms include nothing else today, and she denies fever, headache and jaw pain.The patient's symptoms show no change. Treatments tried thus far include ibuprofen with fair  relief. She reports sick contacts.  She would also like an annual physical today.  She has an OBGYN whom she sees every year.  She does not have a cervix due to hysterectomy in 2001.  Her GYN doctor also manages her breast screening, which has been negative thus far.  She is a never smoker. She does not have a history of HTN. She has a history depression and reports this is doing as well as it ever has.     She has a history of insomnia and B12 deficiency and is requesting medication refills today.    She  has a past medical history of Irritable bowel syndrome; Chronic headache; Chiari malformation; GERD (gastroesophageal reflux disease); Endometriosis; Obesity; Depression; B12 deficiency; Insomnia; and Pseudomeningocele, acquired.    Medications reviewed and updated by myself where necessary, and exist elsewhere in the encounter.   Ms. Falkenstein  is allergic to ciprofloxacin; doxycycline; oxycodone; and sulfa antibiotics. She  reports that she has never smoked. She has never used smokeless tobacco. She reports that she drinks alcohol. She reports that she does not use illicit drugs. She  reports that she does not currently engage in sexual activity. The patient  has past surgical history that includes Abdominal hysterectomy (2001); Laparoscopic gastric banding (2006); Spinal fusion; Cholecystectomy; Brain surgery; and ERCP (2000).  Her family history includes Cancer in her father; Colon polyps in her father and mother; Diabetes in her mother; Lymphoma in her father and mother. There is no history of Colon cancer, Rectal cancer, or Stomach cancer.  Review of Systems  Constitutional: Negative for fever and chills.  Respiratory: Negative for cough.   Cardiovascular: Negative for chest pain.  Gastrointestinal: Negative for nausea, vomiting and abdominal pain.  Genitourinary: Negative for dysuria, urgency, frequency, hematuria and flank pain.  Musculoskeletal: Negative for myalgias.  Skin: Negative for itching and rash.  Neurological: Negative for dizziness and headaches.    OBJECTIVE  Her  height is 5' 5.5" (1.664 m) and weight is 302 lb (136.986 kg). Her oral temperature is 98.2 F (36.8 C). Her blood pressure is 122/72 and her pulse is 97. Her respiration is 17 and oxygen saturation is 98%.  The patient's body mass index is 49.47 kg/(m^2).  Physical Exam  Constitutional: She is oriented to person, place, and time. She appears well-developed and well-nourished. No distress.  HENT:  Right Ear: Hearing, tympanic membrane, external ear and ear canal normal.  Left Ear: Hearing, tympanic membrane, external ear and ear canal normal.  Nose: Nose normal.  Mouth/Throat: Uvula is midline, oropharynx is clear and moist and mucous membranes are normal.  Cardiovascular: Normal rate and regular rhythm.   Respiratory: Effort normal and breath sounds  normal. No respiratory distress. She has no wheezes. She has no rales. She exhibits no tenderness.  GI: Soft. Bowel sounds are normal.  Neurological: She is alert and oriented to person, place, and time. No cranial nerve deficit. Coordination normal.  Skin: Skin is warm and dry. She is not diaphoretic.  Psychiatric: She has a normal mood and affect. Her behavior is normal. Judgment and thought content normal.    Results for orders placed or performed in visit on 07/20/15 (from the past 24 hour(s))  COMPLETE METABOLIC PANEL WITH GFR     Status: None   Collection Time: 07/20/15  9:15 AM  Result Value Ref Range   Sodium 140 135 - 146 mmol/L   Potassium 4.3 3.5 - 5.3 mmol/L   Chloride 103 98 - 110 mmol/L   CO2 25 20 - 31 mmol/L   Glucose, Bld 78 65 - 99 mg/dL   BUN 8 7 - 25 mg/dL   Creat 1.03 0.50 - 1.10 mg/dL   Total Bilirubin 0.5 0.2 - 1.2 mg/dL   Alkaline Phosphatase 99 33 - 115 U/L   AST 17 10 - 35 U/L   ALT 23 6 - 29 U/L   Total Protein 6.9 6.1 - 8.1 g/dL   Albumin 4.0 3.6 - 5.1 g/dL   Calcium 8.9 8.6 - 10.2 mg/dL   GFR, Est African American 75 >=60 mL/min   GFR, Est Non African American 65 >=60 mL/min   Narrative   Performed at:  [address]                [place], [place] [address]  Hemoglobin A1c     Status: None (Preliminary result)   Collection Time: 07/20/15  9:15 AM  Result Value Ref Range   Hgb A1c MFr Bld  <5.7 %   Mean Plasma Glucose  <117 mg/dL   Narrative   Performed at:  [address]                [place], [place] [address]  CBC with Differential/Platelet     Status: None (Preliminary result)   Collection Time: 07/20/15  9:15 AM  Result Value Ref Range   WBC  4.0 - 10.5 K/uL   RBC  3.87 - 5.11 MIL/uL   Hemoglobin  12.0 - 15.0 g/dL   HCT  36.0 - 46.0 %   MCV  78.0 - 100.0 fL   MCH  26.0 - 34.0 pg   MCHC  30.0 - 36.0 g/dL   RDW  11.5 - 15.5 %   Platelets  150  - 400 K/uL   MPV  8.6 - 12.4 fL   Neutrophils Relative %  43 - 77 %   Neutro Abs  1.7 - 7.7 K/uL   Lymphocytes Relative  12 - 46 %   Lymphs Abs  0.7 - 4.0 K/uL   Monocytes Relative  3 - 12 %   Monocytes Absolute  0.1 - 1.0 K/uL   Eosinophils Relative  0 - 5 %   Eosinophils Absolute  0.0 - 0.7 K/uL   Basophils Relative  0 - 1 %   Basophils Absolute  0.0 - 0.1 K/uL  Smear Review     Narrative   Performed at:  [address]                [place], [place] [address]  TSH     Status: None (Preliminary result)   Collection Time: 07/20/15  9:15 AM  Result Value Ref Range   TSH  0.350 - 4.500 uIU/mL   Narrative   Performed at:  [address]                [place], [place] [address]  Folate     Status: None (Preliminary result)   Collection Time: 07/20/15  9:15 AM  Result Value Ref Range   Folate  ng/mL   Narrative   Performed at:  [address]                [place], [place] [address]  Vitamin B12     Status: None (Preliminary result)   Collection Time: 07/20/15  9:15 AM  Result Value Ref Range   Vitamin B-12  211 - 911 pg/mL   Narrative   Performed at:  [address]                [place], [place] [address]  HIV antibody     Status: None (Preliminary result)   Collection Time: 07/20/15  9:15 AM  Result Value Ref Range   HIV 1&2 Ab, 4th Generation  NONREACTIVE   Narrative   Performed at:  [address]                [place], [place] [address]  RPR     Status: None (Preliminary result)   Collection Time: 07/20/15  9:15 AM  Result Value Ref Range   RPR Ser Ql  NON REAC   Narrative   Performed at:  [address]                [place],  [address]  POCT urinalysis dipstick     Status: None    Collection Time: 07/20/15  9:38 AM  Result Value Ref Range   Color, UA Amber    Clarity, UA Slightly cloudy    Glucose, UA Negative    Bilirubin, UA Negative    Ketones, UA Negative    Spec Grav, UA 1.025    Blood, UA trace-Intact    pH, UA 6.0    Protein, UA 30    Urobilinogen, UA 0.2    Nitrite, UA Negative    Leukocytes, UA Negative Negative    ASSESSMENT & PLAN  Revia was seen today for cough and uri.  Diagnoses and all orders for this visit:  Viral URI with cough Orders: -     HYDROcodone-homatropine (HYCODAN) 5-1.5 MG/5ML syrup; Take 2.5 mLs by mouth every 6 (six) hours  as needed for cough.  Annual physical exam Orders: -     POCT urinalysis dipstick  Family history of B12 deficiency Orders: -     cyanocobalamin (,VITAMIN B-12,) 1000 MCG/ML injection; Inject 1 mL (1,000 mcg total) into the muscle every 30 (thirty) days.  Medication refill Orders: -     zolpidem (AMBIEN) 10 MG tablet; Take 1 tablet (10 mg total) by mouth at bedtime as needed. for sleep  Screening Orders: -     COMPLETE METABOLIC PANEL WITH GFR -     Hemoglobin A1c -     CBC with Differential/Platelet -     TSH -     Folate -     Vitamin B12 -     HIV antibody -     RPR    The patient was advised to call or come back to clinic if she does not see an improvement in symptoms, or worsens with the above plan.   Philis Fendt, MHS, PA-C [facility] 07/20/2015 2:16 PM

## 2015-07-23 ENCOUNTER — Other Ambulatory Visit: Payer: Self-pay | Admitting: Physician Assistant

## 2015-07-23 ENCOUNTER — Telehealth: Payer: Self-pay

## 2015-07-23 DIAGNOSIS — J019 Acute sinusitis, unspecified: Principal | ICD-10-CM

## 2015-07-23 DIAGNOSIS — B9689 Other specified bacterial agents as the cause of diseases classified elsewhere: Secondary | ICD-10-CM

## 2015-07-23 MED ORDER — AZITHROMYCIN 250 MG PO TABS: ORAL_TABLET | ORAL | Status: DC

## 2015-07-23 NOTE — Telephone Encounter (Signed)
Pt saw micheal clark recently and was told if she was not any better to call back   She would like to know what to do next   Best number [phone]

## 2015-07-23 NOTE — Progress Notes (Signed)
Initial work up negative.  Patient failed initial therapy.  Will cover for a bacterial etiology. Philis Fendt, MS, PA-C   2:57 PM, 07/23/2015

## 2015-07-23 NOTE — Telephone Encounter (Signed)
Please call patient and make her aware that an antibiotic has been sent to her pharmacy. Philis Fendt, MS, PA-C   2:58 PM, 07/23/2015

## 2015-07-23 NOTE — Telephone Encounter (Signed)
Spoke with pt, advised Rx sent in. 

## 2015-07-23 NOTE — Telephone Encounter (Signed)
Spoke with pt, she thinks she has a sinus infection at this point. Face pain, congestion, and headache. She was told to call if no better. Can we send in ABX? Philis Fendt not here.

## 2015-08-05 ENCOUNTER — Other Ambulatory Visit: Payer: Self-pay | Admitting: Neurosurgery

## 2015-08-05 DIAGNOSIS — M546 Pain in thoracic spine: Secondary | ICD-10-CM

## 2015-08-05 DIAGNOSIS — G950 Syringomyelia and syringobulbia: Secondary | ICD-10-CM

## 2015-08-06 ENCOUNTER — Other Ambulatory Visit: Payer: Self-pay | Admitting: Internal Medicine

## 2015-08-11 ENCOUNTER — Ambulatory Visit
Admission: RE | Admit: 2015-08-11 | Discharge: 2015-08-11 | Disposition: A | Discharge status: Home / Self Care | Payer: 59 | Source: Ambulatory Visit | Attending: Neurosurgery | Admitting: Neurosurgery

## 2015-08-11 DIAGNOSIS — G950 Syringomyelia and syringobulbia: Secondary | ICD-10-CM

## 2015-08-11 DIAGNOSIS — M546 Pain in thoracic spine: Secondary | ICD-10-CM

## 2015-08-21 ENCOUNTER — Other Ambulatory Visit: Payer: Self-pay | Admitting: Neurology

## 2015-09-03 ENCOUNTER — Ambulatory Visit: Payer: 59 | Attending: Neurosurgery

## 2015-09-03 DIAGNOSIS — R293 Abnormal posture: Secondary | ICD-10-CM | POA: Insufficient documentation

## 2015-09-03 DIAGNOSIS — M79621 Pain in right upper arm: Secondary | ICD-10-CM | POA: Diagnosis present

## 2015-09-03 DIAGNOSIS — M25521 Pain in right elbow: Secondary | ICD-10-CM

## 2015-09-03 DIAGNOSIS — M542 Cervicalgia: Secondary | ICD-10-CM | POA: Insufficient documentation

## 2015-09-03 NOTE — Patient Instructions (Signed)
Posture - Standing   Good posture is important. Avoid slouching and forward head thrust. Maintain curve in low back and align ears over shoulders, hips over ankles.  Pull your belly button in toward your back bone. Posture Tips DO: - stand tall and erect - keep chin tucked in - keep head and shoulders in alignment - check posture regularly in mirror or large window - pull head back against headrest in car seat;  Change your position often.  Sit with lumbar support. DON'T: - slouch or slump while watching TV or reading - sit, stand or lie in one position  for too long;  Sitting is especially hard on the spine so if you sit at a desk/use the computer, then stand up often! Copyright  VHI. All rights reserved.  Posture - Sitting  Sit upright, head facing forward. Try using a roll to support lower back. Keep shoulders relaxed, and avoid rounded back. Keep hips level with knees. Avoid crossing legs for long periods. Copyright  VHI. All rights reserved.  Chronic neck strain can develop because of poor posture and faulty work habits  Postural strain related to slumped sitting and forward head posture is a leading cause of headaches, neck and upper back pain  General strengthening and flexibility exercises are helpful in the treatment of neck pain.  Most importantly, you should learn to correct the posture that may be contributing to chronic pain.   Change positions frequently  Change your work or home environment to improve posture and mechanics.   PERFORM ALL EXERCISES GENTLY AND WITH GOOD POSTURE.    20 SECOND HOLD, 3 REPS TO EACH SIDE. 4-5 TIMES EACH DAY.   AROM: Neck Rotation   Turn head slowly to look over one shoulder, then the other.   AROM: Neck Flexion   Bend head forward.   AROM: Lateral Neck Flexion   Slowly tilt head toward one shoulder, then the other.        Copyright  VHI. All rights reserved.  Scapular Retraction (Standing)   With arms at sides, pinch  shoulder blades together. Repeat ____ times per set. Do ____ sets per session. Do ____ sessions per day.  http://orth.exer.us/944   Copyright  VHI. All rights reserved.  Chin Protraction / Retraction   Slide head forward keeping chin level. Slide head back, pulling chin in. Hold each position ___ seconds. Repeat ___ times. Do ___ sessions per day.  Copyright  VHI. All rights reserved.  [facility] [address], [place], [place] [address] Phone # [phone] Fax [phone]

## 2015-09-03 NOTE — Therapy (Signed)
[facility] [address], [place], [place], [address] Phone: [phone]   Fax:  [phone]  Physical Therapy Evaluation  Patient Details  Name: Erin Jimenez MRN: [id number] Date of Birth: [birth year] Referring Provider:  Jovita Gamma, MD  Encounter Date: 09/03/2015      PT End of Session - 09/03/15 0917    Visit Number 1   Date for PT Re-Evaluation 10/29/15   PT Start Time 7654   PT Stop Time 0919   PT Time Calculation (min) 32 min   Activity Tolerance Patient tolerated treatment well   Behavior During Therapy [facility] for tasks assessed/performed      Past Medical History  Diagnosis Date  . Irritable bowel syndrome   . Chronic headache   . Chiari malformation   . GERD (gastroesophageal reflux disease)   . Endometriosis   . Obesity   . Depression   . B12 deficiency   . Insomnia   . Pseudomeningocele, acquired     Past Surgical History  Procedure Laterality Date  . Abdominal hysterectomy  2001  . Laparoscopic gastric banding  2006  . Spinal fusion    . Cholecystectomy    . Brain surgery      Chiari malformation  . Ercp  2000    There were no vitals filed for this visit.  Visit Diagnosis:  Neck pain - Plan: PT plan of care cert/re-cert  Posture abnormality - Plan: PT plan of care cert/re-cert  Pain in joint, upper arm, right - Plan: PT plan of care cert/re-cert      Subjective Assessment - 09/03/15 0851    Subjective Pt reports to PT with complaints of neck and upper back pain and Rt UE radiculopathy of a chronic nature.  Pt had cervical fusion C2-6 (pt report) and chiari malformation in 2012.     Pertinent History cervical fusion 2012   Diagnostic tests MRI: no acute findings-see attached report   Patient Stated Goals reduce pain   Currently in Pain? Yes   Pain Score 5    Pain Location Neck   Pain Orientation Right   Pain Descriptors / Indicators Burning   Pain Type Chronic pain   Pain  Radiating Towards Rt UE   Pain Onset More than a month ago   Pain Frequency Constant   Aggravating Factors  weather changes, sitting still, laying in bed, random at times   Pain Relieving Factors getting up to walk around, pain medication            [facility] PT Assessment - 09/03/15 0001    Assessment   Medical Diagnosis cervicalgia (M54.2)   Onset Date/Surgical Date 09/03/11   Hand Dominance Right   Next MD Visit 11/2015   Precautions   Precautions None   Restrictions   Weight Bearing Restrictions No   Balance Screen   Has the patient fallen in the past 6 months No   Has the patient had a decrease in activity level because of a fear of falling?  No   Is the patient reluctant to leave their home because of a fear of falling?  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  Pt with history of fusion.  UE AROM full without significant pain   Strength   Overall Strength Within functional limits for tasks performed   Overall Strength Comments 4+/5 bilateral UE strength   Palpation   Spinal mobility limited cervical spinal mobility due to fusion.     Palpation comment Pt with active trigger point in Rt UT and along bilateral cervical paraspinals and suboccipitals.                            PT Education - 09/03/15 0911    Education provided Yes   Education Details HEP: cervical AROM, scapular squeezes, posture education   Person(s) Educated Patient   Methods Explanation;Demonstration;Handout   Comprehension Verbalized understanding;Returned demonstration          PT Short Term Goals - 09/03/15 0925    PT SHORT TERM GOAL #1   Title be independent in initial HEP   Time 4   Period Weeks   Status New   PT SHORT TERM GOAL #2   Title report a 25% reduction in neck/Rt UE pain with ADLs and work tasks   Time 4   Period Weeks   Status New   PT SHORT TERM GOAL #3   Title report postural corrections with work tasks at least 50% of the time   Time 4   Period Weeks   Status New           PT Long Term Goals - 09/03/15 0926    PT LONG TERM GOAL #1   Title be independent in advanced HEP   Time 8   Period Weeks   Status New   PT LONG TERM GOAL #2   Title reduce FOTO to < or = to 45% limitation   Time 8   Period Weeks   Status New   PT LONG TERM GOAL #3   Title report a 50% reduction in neck and thoracic pain with ADLs and work tasks   Time 8   Period Weeks   Status New               Plan - 09/03/15 2330    Clinical Impression Statement Pt presents to PT 4 years s/p multilevel cervical fusion with postural dysfunction, tension in cervical/upper back musculature and painful and limited cervical AROM.  FOTO score is 58% limitation.  Pt will benefit from skilled PT for postural education, strength, cervical flexibility and manual/modalities for pain management.     Pt will benefit from skilled therapeutic intervention in order to improve on the following deficits Decreased range of motion;Increased muscle spasms;Decreased activity tolerance;Postural dysfunction;Impaired flexibility   Rehab Potential Good   PT Frequency 2x / week   PT Duration 8 weeks   PT Treatment/Interventions ADLs/Self Care Home Management;Cryotherapy;Electrical Stimulation;Moist  Heat;Therapeutic exercise;Therapeutic activities;Functional mobility training;Ultrasound;Neuromuscular re-education;Patient/family education;Manual techniques;Passive range of motion   PT Next Visit Plan postural strength, cervical flexibiilty, manual to cervical/thoracic spine   Consulted and Agree with Plan of Care Patient         Problem List Patient  Active Problem List   Diagnosis Date Noted  . Complex sleep apnea syndrome 04/26/2015  . Hypoventilation associated with obesity syndrome 04/26/2015  . OSA (obstructive sleep apnea) 02/05/2015  . Obesity hypoventilation syndrome 12/25/2014  . Depression (emotion) 12/25/2014  . Hypersomnia with sleep apnea 12/25/2014  . Chronic tension-type headache, intractable 12/25/2014  . Arnold-Chiari malformation, type II 12/25/2014  . Dysphagia 01/10/2013  . IBS (irritable bowel syndrome) 01/10/2013  . Chiari malformation 12/08/2012  . GERD (gastroesophageal reflux disease) 02/10/2012  . Allergic rhinitis due to pollen 02/10/2012  . Insomnia 02/10/2012  . B12 deficiency 02/10/2012  . BMI 40.0-44.9, adult 02/10/2012  . Pseudomeningocele, acquired 06/11/2011  . OBESITY, UNSPECIFIED 02/10/2008  . DEPRESSION 02/10/2008  . CONSTIPATION 02/10/2008  . IRRITABLE BOWEL SYNDROME 02/10/2008  . ENDOMETRIOSIS 02/10/2008    TAKACS,KELLY, PT 09/03/2015, 9:31 AM  [facility] [address], [place], [place], [address] Phone: [phone]   Fax:  [phone]

## 2015-09-07 ENCOUNTER — Other Ambulatory Visit: Payer: Self-pay | Admitting: Internal Medicine

## 2015-09-10 ENCOUNTER — Encounter: Payer: Self-pay | Admitting: Physical Therapy

## 2015-09-10 ENCOUNTER — Ambulatory Visit: Payer: 59 | Admitting: Physical Therapy

## 2015-09-10 DIAGNOSIS — M25521 Pain in right elbow: Secondary | ICD-10-CM

## 2015-09-10 DIAGNOSIS — R293 Abnormal posture: Secondary | ICD-10-CM

## 2015-09-10 DIAGNOSIS — M542 Cervicalgia: Secondary | ICD-10-CM

## 2015-09-10 NOTE — Patient Instructions (Signed)
  PNF Strengthening: Resisted   Standing with resistive band around each hand, bring right arm up and left arm down and back., thumb back. Repeat _10___ times per set. Do _2___ sets per session. Do _1-2___ sessions per day.   Resisted Horizontal Abduction: Bilateral   Sit or stand, tubing in both hands, (inside of hand to outside) arms out in front. Keeping arms straight, pinch shoulder blades together and stretch arms out. Repeat _10___ times per set. Do 2____ sets per session. Do _1-2___ sessions per day.   Scapular Retraction: Elbow Flexion  Sitting or (Standing)    With elbows bent to 90, pinch shoulder blades together and rotate arms out, keeping elbows bent. Repeat _10___ times per set. Do _1___ sets per session. Do many sessions per day.  You can also use thera band and hold isometric contraction for 3 sec   Strengthening: Resisted Extension   Hold tubing in BOTH hands, arm forward. Pull arms back, elbow straight. Repeat _10___ times per set. Do _2___ sets per session. Do _1-2___ sessions per day.  Can place band around the front of your body and perform with both arms at the same time.

## 2015-09-10 NOTE — Therapy (Addendum)
[facility] [address] [place], [place], [address] Phone: [phone]   Fax:  [phone]  Physical Therapy Treatment  Patient Details  Name: Erin Jimenez MRN: [id number] Date of Birth: 04-19-1971 Referring Provider:  Leandrew Koyanagi, MD  Encounter Date: 09/10/2015      PT End of Session - 09/10/15 1631    Visit Number 2   Date for PT Re-Evaluation 10/29/15   PT Start Time 0800   PT Stop Time 0845   PT Time Calculation (min) 45 min   Activity Tolerance Patient tolerated treatment well   Behavior During Therapy [facility] for tasks assessed/performed      Past Medical History  Diagnosis Date  . Irritable bowel syndrome   . Chronic headache   . Chiari malformation   . GERD (gastroesophageal reflux disease)   . Endometriosis   . Obesity   . Depression   . B12 deficiency   . Insomnia   . Pseudomeningocele, acquired     Past Surgical History  Procedure Laterality Date  . Abdominal hysterectomy  2001  . Laparoscopic gastric banding  2006  . Spinal fusion    . Cholecystectomy    . Brain surgery      Chiari malformation  . Ercp  2000    There were no vitals filed for this visit.  Visit Diagnosis:  Neck pain  Posture abnormality  Pain in joint, upper arm, right      Subjective Assessment - 09/10/15 0810    Subjective Pt reports pain in neck going down the thoracic spine as a burning sensation, feeling restless has to change positions in addition radiating pain in Rt UE. Pt after cervical fusion C2-6 and chiari malformation in 2012    Pertinent History cervical fusion C2-6 in 2012, Chiari malformation.   Currently in Pain? Yes   Pain Score 4   pain rated as 4-7/10 in the last 24 hours, rainy weather  incr pain   Pain Location Neck   Pain Orientation Right   Pain Descriptors / Indicators Burning   Pain Onset More than a month ago   Pain Frequency Constant   Multiple Pain Sites No                          [facility] Adult PT Treatment/Exercise - 09/10/15 0001    Posture/Postural Control   Posture/Postural Control Postural limitations   Postural Limitations Forward head;Rounded Shoulders   Exercises   Exercises Neck;Shoulder   Neck Exercises: Machines for Strengthening   UBE (Upper Arm Bike) L 1 x 70min (3/3)   Neck Exercises: Standing   Neck Retraction 20 reps  pushing into blue ball    Shoulder Exercises: Seated   Retraction Strengthening;Both;20 reps   External Rotation Both;20 reps;Theraband  green t-band   Other Seated Exercises D 2 3x5 with green t-band     Manual Therapy: Soft tissue work to bil upper trapezius and bil cervical paraspinals in sitting, with AROM cervical to increase elongation, all performed in sitting. Pt very tight in upper quadrant and bil paraspinals.              PT Short Term Goals - 09/10/15 1635    PT SHORT TERM GOAL #1   Title be independent in initial HEP   Time 4   Period Weeks   Status On-going   PT SHORT TERM GOAL #2   Title report a 25% reduction in  neck/Rt UE pain with ADLs and work tasks   Time 4   Period Weeks   Status On-going   PT SHORT TERM GOAL #3   Title report postural corrections with work tasks at least 50% of the time   Time 4   Period Weeks   Status On-going           PT Long Term Goals - 09/10/15 1636    PT LONG TERM GOAL #1   Title be independent in advanced HEP   Time 8   Period Weeks   Status On-going   PT LONG TERM GOAL #2   Title reduce FOTO to < or = to 45% limitation   Time 8   Period Weeks   Status On-going   PT LONG TERM GOAL #3   Title report a 50% reduction in neck and thoracic pain with ADLs and work tasks   Time 8   Period Weeks   Status On-going               Plan - 09/10/15 1631    Clinical Impression Statement Pt presents to Pt after 4 years of cervical fusion C2-6 (pt report), and chiari malformation. Pt with palpaple tnederness upper  quadrant and cervical spine. Pt will benefit from skilled PT for postural strength, posture awarness, thoracic and cervical flexibility and manual /modalities for pain managment.   Pt will benefit from skilled therapeutic intervention in order to improve on the following deficits Decreased range of motion;Increased muscle spasms;Decreased activity tolerance;Postural dysfunction;Impaired flexibility   Rehab Potential Good   PT Frequency 2x / week   PT Duration 8 weeks   PT Treatment/Interventions ADLs/Self Care Home Management;Cryotherapy;Electrical Stimulation;Moist Heat;Therapeutic exercise;Therapeutic activities;Functional mobility training;Ultrasound;Neuromuscular re-education;Patient/family education;Manual techniques;Passive range of motion   PT Next Visit Plan bodymechanics, cervical flexibiilty, manual to cervical/thoracic spine, may do [place]   Consulted and Agree with Plan of Care Patient        Problem List Patient Active Problem List   Diagnosis Date Noted  . Complex sleep apnea syndrome 04/26/2015  . Hypoventilation associated with obesity syndrome 04/26/2015  . OSA (obstructive sleep apnea) 02/05/2015  . Obesity hypoventilation syndrome 12/25/2014  . Depression (emotion) 12/25/2014  . Hypersomnia with sleep apnea 12/25/2014  . Chronic tension-type headache, intractable 12/25/2014  . Arnold-Chiari malformation, type II 12/25/2014  . Dysphagia 01/10/2013  . IBS (irritable bowel syndrome) 01/10/2013  . Chiari malformation 12/08/2012  . GERD (gastroesophageal reflux disease) 02/10/2012  . Allergic rhinitis due to pollen 02/10/2012  . Insomnia 02/10/2012  . B12 deficiency 02/10/2012  . BMI 40.0-44.9, adult 02/10/2012  . Pseudomeningocele, acquired 06/11/2011  . OBESITY, UNSPECIFIED 02/10/2008  . DEPRESSION 02/10/2008  . CONSTIPATION 02/10/2008  . IRRITABLE BOWEL SYNDROME 02/10/2008  . ENDOMETRIOSIS 02/10/2008    NAUMANN-HOUEGNIFIO,Markeese Boyajian PTA 09/10/2015, 4:39 PM  [facility] [address], [place], [place], [address] Phone: [phone]   Fax:  [phone]

## 2015-09-16 ENCOUNTER — Other Ambulatory Visit: Payer: Self-pay | Admitting: Gastroenterology

## 2015-09-17 ENCOUNTER — Encounter: Payer: Self-pay | Admitting: Physical Therapy

## 2015-09-17 ENCOUNTER — Ambulatory Visit: Payer: 59 | Attending: Neurosurgery | Admitting: Physical Therapy

## 2015-09-17 DIAGNOSIS — R293 Abnormal posture: Secondary | ICD-10-CM | POA: Diagnosis present

## 2015-09-17 DIAGNOSIS — M79621 Pain in right upper arm: Secondary | ICD-10-CM | POA: Insufficient documentation

## 2015-09-17 DIAGNOSIS — M25521 Pain in right elbow: Secondary | ICD-10-CM

## 2015-09-17 DIAGNOSIS — M542 Cervicalgia: Secondary | ICD-10-CM | POA: Diagnosis not present

## 2015-09-17 NOTE — Therapy (Signed)
[facility] [address], [place], [place], [address] Phone: [phone]   Fax:  [phone]  Physical Therapy Treatment  Patient Details  Name: Erin Jimenez MRN: [id number] Date of Birth: 02/02/1971 Referring Provider:  Jovita Gamma, MD  Encounter Date: 09/17/2015      PT End of Session - 09/17/15 0814    Visit Number 3   Date for PT Re-Evaluation 10/29/15   PT Start Time 0804   PT Stop Time 0845   PT Time Calculation (min) 41 min   Activity Tolerance Patient tolerated treatment well   Behavior During Therapy [facility] for tasks assessed/performed      Past Medical History  Diagnosis Date  . Irritable bowel syndrome   . Chronic headache   . Chiari malformation   . GERD (gastroesophageal reflux disease)   . Endometriosis   . Obesity   . Depression   . B12 deficiency   . Insomnia   . Pseudomeningocele, acquired     Past Surgical History  Procedure Laterality Date  . Abdominal hysterectomy  2001  . Laparoscopic gastric banding  2006  . Spinal fusion    . Cholecystectomy    . Brain surgery      Chiari malformation  . Ercp  2000    There were no vitals filed for this visit.  Visit Diagnosis:  Neck pain  Posture abnormality  Pain in joint, upper arm, right      Subjective Assessment - 09/17/15 0807    Subjective Pt reports was feeling good for the rest of the day after last Pt session, but tightness and headache came back. Pt after cervical fusion c@-6 and chiari malformation, radiating pain in Rt UE.    Currently in Pain? Yes   Pain Score 6    Pain Location Neck   Pain Orientation Right   Pain Descriptors / Indicators Burning   Pain Type Chronic pain   Pain Onset More than a month ago   Pain Frequency Constant   Multiple Pain Sites No                         [facility] Adult PT Treatment/Exercise - 09/17/15 0001    Bed Mobility   Bed Mobility --  Pt educated in proper  bodymechanics with cervical spine old    Posture/Postural Control   Posture/Postural Control Postural limitations   Postural Limitations Forward head;Rounded Shoulders   Exercises   Exercises Neck;Shoulder   Neck Exercises: Machines for Strengthening   UBE (Upper Arm Bike) L 1 x 49min (3/3)  may sit on physioball next visit   Neck Exercises: Standing   Neck Retraction 20 reps  pushing into blue ball   Upper Extremity D2 Theraband  3 x 5 each side /green t-band   Other Standing Exercises bil. ER for strengthening with green t-band 2 x 10   Shoulder Exercises: Seated   Retraction Strengthening;Both;20 reps   External Rotation Both;20 reps;Theraband  green t-band   Other Seated Exercises D 2 3x5 with green t-band   Modalities   Modalities Ultrasound   Ultrasound   Ultrasound Location bil UT & cervical Paraspinals    Ultrasound Parameters 100%, 1MHz, 1/Wcm   Ultrasound Goals Pain   Manual Therapy   Manual Therapy Soft tissue mobilization  to upper trapezius, and bil cervical paraspinals   Manual therapy comments palpaple tenderness in upper quadrant and bil cervical paraspinals  PT Education - 09/17/15 0851    Education provided Yes   Education Details Posture neck old handout   Person(s) Educated Patient   Methods Explanation;Handout   Comprehension Verbalized understanding          PT Short Term Goals - 09/17/15 0900    PT SHORT TERM GOAL #1   Title be independent in initial HEP   Time 4   Period Weeks   Status Achieved   PT SHORT TERM GOAL #2   Title report a 25% reduction in neck/Rt UE pain with ADLs and work tasks   Time 4   Period Weeks   Status On-going   PT SHORT TERM GOAL #3   Title report postural corrections with work tasks at least 50% of the time   Time 4   Period Weeks   Status On-going           PT Long Term Goals - 09/17/15 0900    PT LONG TERM GOAL #1   Title be independent in advanced HEP   Time 8   Period Weeks    Status On-going   PT LONG TERM GOAL #2   Title reduce FOTO to < or = to 45% limitation   Time 8   Period Weeks   Status On-going   PT LONG TERM GOAL #3   Title report a 50% reduction in neck and thoracic pain with ADLs and work tasks   Time 8   Period Weeks   Status On-going               Plan - 09/17/15 0817    Clinical Impression Statement Pt presents to PT after 4 years of cerfical fusion C2-6 (pt report) and chiari malformation. Pt with tenderness upper quadrant and cervical spine. Pt will continue to benefit from skilled PT for postural awarness, strength, thoracic and cervical flexibility, modalities PRN   Pt will benefit from skilled therapeutic intervention in order to improve on the following deficits Decreased range of motion;Increased muscle spasms;Decreased activity tolerance;Postural dysfunction;Impaired flexibility   Rehab Potential Good   PT Frequency 2x / week   PT Duration 8 weeks   PT Treatment/Interventions ADLs/Self Care Home Management;Cryotherapy;Electrical Stimulation;Moist Heat;Therapeutic exercise;Therapeutic activities;Functional mobility training;Ultrasound;Neuromuscular re-education;Patient/family education;Manual techniques;Passive range of motion   PT Next Visit Plan Continue with strengthening and flexibility upper quadrant, manual to cervical/thoracic spine,    Consulted and Agree with Plan of Care Patient        Problem List Patient Active Problem List   Diagnosis Date Noted  . Complex sleep apnea syndrome 04/26/2015  . Hypoventilation associated with obesity syndrome ([place]) 04/26/2015  . OSA (obstructive sleep apnea) 02/05/2015  . Obesity hypoventilation syndrome ([place]) 12/25/2014  . Depression (emotion) 12/25/2014  . Hypersomnia with sleep apnea 12/25/2014  . Chronic tension-type headache, intractable 12/25/2014  . Arnold-Chiari malformation, type II ([place]) 12/25/2014  . Dysphagia 01/10/2013  . IBS (irritable bowel syndrome) 01/10/2013   . Chiari malformation 12/08/2012  . GERD (gastroesophageal reflux disease) 02/10/2012  . Allergic rhinitis due to pollen 02/10/2012  . Insomnia 02/10/2012  . B12 deficiency 02/10/2012  . BMI 40.0-44.9, adult ([place]) 02/10/2012  . Pseudomeningocele, acquired 06/11/2011  . OBESITY, UNSPECIFIED 02/10/2008  . DEPRESSION 02/10/2008  . CONSTIPATION 02/10/2008  . IRRITABLE BOWEL SYNDROME 02/10/2008  . ENDOMETRIOSIS 02/10/2008    NAUMANN-HOUEGNIFIO,Brockton Mckesson PTA 09/17/2015, 9:03 AM  [facility] [address] [place], [place], [address] Phone: [phone]   Fax:  [phone]

## 2015-09-19 ENCOUNTER — Ambulatory Visit: Payer: 59 | Admitting: Physical Therapy

## 2015-09-19 ENCOUNTER — Encounter: Payer: Self-pay | Admitting: Physical Therapy

## 2015-09-19 DIAGNOSIS — R293 Abnormal posture: Secondary | ICD-10-CM

## 2015-09-19 DIAGNOSIS — M542 Cervicalgia: Secondary | ICD-10-CM | POA: Diagnosis not present

## 2015-09-19 DIAGNOSIS — M25521 Pain in right elbow: Secondary | ICD-10-CM

## 2015-09-19 NOTE — Therapy (Signed)
[facility] [address] [place], [place], [address] Phone: [phone]   Fax:  [phone]  Physical Therapy Treatment  Patient Details  Name: Erin Jimenez MRN: [id number] Date of Birth: [birth year] Referring Provider:  Jovita Gamma, MD  Encounter Date: 09/19/2015      PT End of Session - 09/19/15 0762    Visit Number 4   Date for PT Re-Evaluation 10/29/15   PT Start Time 0802   PT Stop Time 0845   PT Time Calculation (min) 43 min   Activity Tolerance Patient tolerated treatment well   Behavior During Therapy [facility] for tasks assessed/performed      Past Medical History  Diagnosis Date  . Irritable bowel syndrome   . Chronic headache   . Chiari malformation   . GERD (gastroesophageal reflux disease)   . Endometriosis   . Obesity   . Depression   . B12 deficiency   . Insomnia   . Pseudomeningocele, acquired     Past Surgical History  Procedure Laterality Date  . Abdominal hysterectomy  2001  . Laparoscopic gastric banding  2006  . Spinal fusion    . Cholecystectomy    . Brain surgery      Chiari malformation  . Ercp  2000    There were no vitals filed for this visit.  Visit Diagnosis:  Neck pain  Posture abnormality  Pain in joint, upper arm, right      Subjective Assessment - 09/19/15 0808    Subjective Pt reports she felt the relieve in neck after PT until next morning. Pt has headache today and tighness in neck may be due to storm "[place]" approaching [place]. Pt after cervical fusion C2-6 and chiari malfomation, radiating pain in Rt UE.    Currently in Pain? Yes   Pain Score 5    Pain Location Neck   Pain Orientation Right   Pain Descriptors / Indicators Burning   Pain Type Chronic pain   Pain Onset More than a month ago   Pain Frequency Constant   Multiple Pain Sites No                         [facility] Adult PT Treatment/Exercise - 09/19/15 0001    Posture/Postural  Control   Posture/Postural Control Postural limitations   Postural Limitations Forward head;Rounded Shoulders   Exercises   Exercises Neck;Shoulder   Neck Exercises: Machines for Strengthening   UBE (Upper Arm Bike) L 1 x 12min (3/3)  may sit on physioball   Neck Exercises: Standing   Neck Retraction 20 reps   Upper Extremity D2 Theraband  3 x 5 each side   Other Standing Exercises bil. ER for strengthening with green t-band 2 x 10   Shoulder Exercises: Seated   External Rotation Both;20 reps;Theraband  green t-band   Other Seated Exercises D 2 3x5 with green t-band   Modalities   Modalities Ultrasound   Ultrasound   Ultrasound Location bil UT   Ultrasound Parameters 100%, 1Mhz,    Ultrasound Goals Pain   Manual Therapy   Manual Therapy Soft tissue mobilization   Manual therapy comments palpaple tenderness in upper quadrant and bil cervical paraspinals                  PT Short Term Goals - 09/17/15 0900    PT SHORT TERM GOAL #1   Title be independent in initial HEP   Time  4   Period Weeks   Status Achieved   PT SHORT TERM GOAL #2   Title report a 25% reduction in neck/Rt UE pain with ADLs and work tasks   Time 4   Period Weeks   Status On-going   PT SHORT TERM GOAL #3   Title report postural corrections with work tasks at least 50% of the time   Time 4   Period Weeks   Status On-going           PT Long Term Goals - 09/17/15 0900    PT LONG TERM GOAL #1   Title be independent in advanced HEP   Time 8   Period Weeks   Status On-going   PT LONG TERM GOAL #2   Title reduce FOTO to < or = to 45% limitation   Time 8   Period Weeks   Status On-going   PT LONG TERM GOAL #3   Title report a 50% reduction in neck and thoracic pain with ADLs and work tasks   Time 8   Period Weeks   Status On-going               Plan - 09/19/15 8099    Clinical Impression Statement Pt presents to PT after 4 years of cerfical fusion C2-6 (pt report) and  chiari malformation. Pt with tenderness upper quadrant and cervical spine. Pt will continue to benefit from skilled PT    Pt will benefit from skilled therapeutic intervention in order to improve on the following deficits Decreased range of motion;Increased muscle spasms;Decreased activity tolerance;Postural dysfunction;Impaired flexibility   Rehab Potential Good   PT Frequency 2x / week   PT Duration 8 weeks   PT Treatment/Interventions ADLs/Self Care Home Management;Cryotherapy;Electrical Stimulation;Moist Heat;Therapeutic exercise;Therapeutic activities;Functional mobility training;Ultrasound;Neuromuscular re-education;Patient/family education;Manual techniques;Passive range of motion   PT Next Visit Plan Continue with strengthening and flexibility upper quadrant, Ultrasound, manual to cervical/thoracic spine,    Consulted and Agree with Plan of Care Patient        Problem List Patient Active Problem List   Diagnosis Date Noted  . Complex sleep apnea syndrome 04/26/2015  . Hypoventilation associated with obesity syndrome ([place]) 04/26/2015  . OSA (obstructive sleep apnea) 02/05/2015  . Obesity hypoventilation syndrome ([place]) 12/25/2014  . Depression (emotion) 12/25/2014  . Hypersomnia with sleep apnea 12/25/2014  . Chronic tension-type headache, intractable 12/25/2014  . Arnold-Chiari malformation, type II ([place]) 12/25/2014  . Dysphagia 01/10/2013  . IBS (irritable bowel syndrome) 01/10/2013  . Chiari malformation 12/08/2012  . GERD (gastroesophageal reflux disease) 02/10/2012  . Allergic rhinitis due to pollen 02/10/2012  . Insomnia 02/10/2012  . B12 deficiency 02/10/2012  . BMI 40.0-44.9, adult ([place]) 02/10/2012  . Pseudomeningocele, acquired 06/11/2011  . OBESITY, UNSPECIFIED 02/10/2008  . DEPRESSION 02/10/2008  . CONSTIPATION 02/10/2008  . IRRITABLE BOWEL SYNDROME 02/10/2008  . ENDOMETRIOSIS 02/10/2008    NAUMANN-HOUEGNIFIO,Jeovani Weisenburger PTA 09/19/2015, 8:43 AM  [facility] [address], [place], [place], [address] Phone: [phone]   Fax:  [phone]

## 2015-09-24 ENCOUNTER — Ambulatory Visit: Payer: 59 | Admitting: Physical Therapy

## 2015-09-24 ENCOUNTER — Encounter: Payer: Self-pay | Admitting: Physical Therapy

## 2015-09-24 DIAGNOSIS — M542 Cervicalgia: Secondary | ICD-10-CM | POA: Diagnosis not present

## 2015-09-24 DIAGNOSIS — M25521 Pain in right elbow: Secondary | ICD-10-CM

## 2015-09-24 DIAGNOSIS — R293 Abnormal posture: Secondary | ICD-10-CM

## 2015-09-24 NOTE — Therapy (Signed)
[facility] [address] [place], [place], [address] Phone: [phone]   Fax:  [phone]  Physical Therapy Treatment  Patient Details  Name: Erin Jimenez MRN: [id number] Date of Birth: 10-Jan-1971 Referring Provider:  Jovita Gamma, MD  Encounter Date: 09/24/2015      PT End of Session - 09/24/15 0820    Visit Number 5   Date for PT Re-Evaluation 10/29/15   PT Start Time 0759   PT Stop Time 0843   PT Time Calculation (min) 44 min   Activity Tolerance Patient tolerated treatment well   Behavior During Therapy [facility] for tasks assessed/performed      Past Medical History  Diagnosis Date  . Irritable bowel syndrome   . Chronic headache   . Chiari malformation   . GERD (gastroesophageal reflux disease)   . Endometriosis   . Obesity   . Depression   . B12 deficiency   . Insomnia   . Pseudomeningocele, acquired     Past Surgical History  Procedure Laterality Date  . Abdominal hysterectomy  2001  . Laparoscopic gastric banding  2006  . Spinal fusion    . Cholecystectomy    . Brain surgery      Chiari malformation  . Ercp  2000    There were no vitals filed for this visit.  Visit Diagnosis:  Neck pain  Posture abnormality  Pain in joint, upper arm, right      Subjective Assessment - 09/24/15 0809    Subjective Pt reported this Saturday she had to stay in bed due to feeling tightness and pain spreading out  due to -Martyn Malay-. Pt af4ter cervical fusion C2-6 and chiari malformation, radiating pain in Rt UE.   Currently in Pain? Yes   Pain Score 2    Pain Location Neck   Pain Orientation Right   Pain Descriptors / Indicators Burning   Pain Type Chronic pain   Pain Onset More than a month ago   Multiple Pain Sites No                         [facility] Adult PT Treatment/Exercise - 09/24/15 0001    Posture/Postural Control   Posture/Postural Control Postural limitations   Postural Limitations Forward head;Rounded Shoulders   Exercises   Exercises Neck;Shoulder   Neck Exercises: Machines for Strengthening   UBE (Upper Arm Bike) L 1 x 49min (3/3)  may sit on physioball   Neck Exercises: Standing   Neck Retraction 20 reps  pushing into blue ball   Upper Extremity D2 Theraband  3x5 each side   Other Standing Exercises bil. ER for strengthening with green t-band 2 x 10   Other Standing Exercises D2 with green t-band 3 x5 each side   Modalities   Modalities Ultrasound   Ultrasound   Ultrasound Location bil Ut   Ultrasound Parameters 100%, !Mhz, 1W/cm2   Ultrasound Goals Pain   Manual Therapy   Manual Therapy Soft tissue mobilization  to upper trapezius and bil cervical paraspinals   Manual therapy comments palpaple tenderness in upper quadrant and bil cervical paraspinals                  PT Short Term Goals - 09/24/15 0844    PT SHORT TERM GOAL #1   Title be independent in initial HEP   Time 4   Period Weeks   Status Achieved   PT SHORT TERM  GOAL #2   Title report a 25% reduction in neck/Rt UE pain with ADLs and work tasks   Time 4   Period Weeks   Status On-going   PT SHORT TERM GOAL #3   Title report postural corrections with work tasks at least 50% of the time   Time 4   Period Weeks   Status On-going           PT Long Term Goals - 09/24/15 0844    PT LONG TERM GOAL #1   Title be independent in advanced HEP   Time 8   Period Weeks   Status On-going   PT LONG TERM GOAL #2   Title reduce FOTO to < or = to 45% limitation   Time 8   Period Weeks   Status On-going   PT LONG TERM GOAL #3   Title report a 50% reduction in neck and thoracic pain with ADLs and work tasks   Time 8   Period Weeks   Status On-going               Plan - 09/24/15 0821    Clinical Impression Statement Pt presents to PT after 4 years of cervical fusion C2-6 (pt report) and chiari malformation. Pt with imporved activity tolerance, but  still very limited in ROM and tenderness is upper quadrant. Pt will continue to benefit from skillet PT to reduce muscle tightness, increase AROM and strength cervical     Pt will benefit from skilled therapeutic intervention in order to improve on the following deficits Decreased range of motion;Increased muscle spasms;Decreased activity tolerance;Postural dysfunction;Impaired flexibility   Rehab Potential Good   PT Frequency 2x / week   PT Duration 8 weeks   PT Treatment/Interventions ADLs/Self Care Home Management;Cryotherapy;Electrical Stimulation;Moist Heat;Therapeutic exercise;Therapeutic activities;Functional mobility training;Ultrasound;Neuromuscular re-education;Patient/family education;Manual techniques;Passive range of motion   PT Next Visit Plan Continue with flexibility upper quadrant, strengthening, Ultrasound, manual to cervical/thoracic spine,    Consulted and Agree with Plan of Care Patient        Problem List Patient Active Problem List   Diagnosis Date Noted  . Complex sleep apnea syndrome 04/26/2015  . Hypoventilation associated with obesity syndrome ([place]) 04/26/2015  . OSA (obstructive sleep apnea) 02/05/2015  . Obesity hypoventilation syndrome ([place]) 12/25/2014  . Depression (emotion) 12/25/2014  . Hypersomnia with sleep apnea 12/25/2014  . Chronic tension-type headache, intractable 12/25/2014  . Arnold-Chiari malformation, type II ([place]) 12/25/2014  . Dysphagia 01/10/2013  . IBS (irritable bowel syndrome) 01/10/2013  . Chiari malformation 12/08/2012  . GERD (gastroesophageal reflux disease) 02/10/2012  . Allergic rhinitis due to pollen 02/10/2012  . Insomnia 02/10/2012  . B12 deficiency 02/10/2012  . BMI 40.0-44.9, adult ([place]) 02/10/2012  . Pseudomeningocele, acquired 06/11/2011  . OBESITY, UNSPECIFIED 02/10/2008  . DEPRESSION 02/10/2008  . CONSTIPATION 02/10/2008  . IRRITABLE BOWEL SYNDROME 02/10/2008  . ENDOMETRIOSIS 02/10/2008    NAUMANN-HOUEGNIFIO,Ritvik Mczeal  PTA 09/24/2015, 8:46 AM  [facility] [address] [place], [place], [address] Phone: [phone]   Fax:  [phone]

## 2015-09-26 ENCOUNTER — Encounter: Payer: Self-pay | Admitting: Physical Therapy

## 2015-09-26 ENCOUNTER — Ambulatory Visit: Payer: 59 | Admitting: Physical Therapy

## 2015-09-26 DIAGNOSIS — M542 Cervicalgia: Secondary | ICD-10-CM

## 2015-09-26 DIAGNOSIS — M25521 Pain in right elbow: Secondary | ICD-10-CM

## 2015-09-26 DIAGNOSIS — R293 Abnormal posture: Secondary | ICD-10-CM

## 2015-09-26 NOTE — Therapy (Signed)
[facility] [address] [place], [place], [address] Phone: [phone]   Fax:  [phone]  Physical Therapy Treatment  Patient Details  Name: Erin Jimenez MRN: [id number] Date of Birth: [birth year] Referring Provider:  Jovita Gamma, MD  Encounter Date: 09/26/2015      PT End of Session - 09/26/15 0823    Visit Number 6   Date for PT Re-Evaluation 10/29/15   PT Start Time 0759   PT Stop Time 0844   PT Time Calculation (min) 45 min   Activity Tolerance Patient tolerated treatment well   Behavior During Therapy [facility] for tasks assessed/performed      Past Medical History  Diagnosis Date  . Irritable bowel syndrome   . Chronic headache   . Chiari malformation   . GERD (gastroesophageal reflux disease)   . Endometriosis   . Obesity   . Depression   . B12 deficiency   . Insomnia   . Pseudomeningocele, acquired     Past Surgical History  Procedure Laterality Date  . Abdominal hysterectomy  2001  . Laparoscopic gastric banding  2006  . Spinal fusion    . Cholecystectomy    . Brain surgery      Chiari malformation  . Ercp  2000    There were no vitals filed for this visit.  Visit Diagnosis:  Neck pain  Posture abnormality  Pain in joint, upper arm, right      Subjective Assessment - 09/26/15 0804    Subjective Pt reports felt great after last session, but still tightening up in the afternoon. Headache was present but not as intense rated as 2/10. Pt has cervical fusion C2-6 and chiari malformation, radiating pain in Rt UE   Currently in Pain? Yes   Pain Score 2    Pain Location Neck   Pain Orientation Right   Pain Descriptors / Indicators Burning   Pain Type Chronic pain   Pain Onset More than a month ago   Pain Frequency Constant   Multiple Pain Sites No                         [facility] Adult PT Treatment/Exercise - 09/26/15 0001    Posture/Postural Control   Posture/Postural Control Postural limitations   Postural Limitations Forward head;Rounded Shoulders   Exercises   Exercises Neck;Shoulder   Neck Exercises: Machines for Strengthening   UBE (Upper Arm Bike) L 1 x 57min (3/3)  sitting on green physioball   Neck Exercises: Standing   Neck Retraction 20 reps  pushing into blue ball 2 x 10 with 5 sec hold   Upper Extremity D2 Theraband  3x5 each side with green t-band   Shoulder Exercises: Supine   Other Supine Exercises Foam roll for elongation and decompression x 2 min   Other Supine Exercises FR with unilateral UE flexion   Modalities   Modalities Ultrasound   Ultrasound   Ultrasound Location bil UT   Ultrasound Parameters , 1MHz, 1W/cm   Ultrasound Goals Pain   Manual Therapy   Manual Therapy Soft tissue mobilization  to bil UT and bil cervical paraspinals   Manual therapy comments palpaple tenderness in upper quadrant and bil cervical paraspinals                  PT Short Term Goals - 09/24/15 0844    PT SHORT TERM GOAL #1   Title be independent in initial HEP  Time 4   Period Weeks   Status Achieved   PT SHORT TERM GOAL #2   Title report a 25% reduction in neck/Rt UE pain with ADLs and work tasks   Time 4   Period Weeks   Status On-going   PT SHORT TERM GOAL #3   Title report postural corrections with work tasks at least 50% of the time   Time 4   Period Weeks   Status On-going           PT Long Term Goals - 09/24/15 0844    PT LONG TERM GOAL #1   Title be independent in advanced HEP   Time 8   Period Weeks   Status On-going   PT LONG TERM GOAL #2   Title reduce FOTO to < or = to 45% limitation   Time 8   Period Weeks   Status On-going   PT LONG TERM GOAL #3   Title report a 50% reduction in neck and thoracic pain with ADLs and work tasks   Time 8   Period Weeks   Status On-going               Plan - 09/26/15 0093    Clinical Impression Statement Pt presents to PT after 4 years  of cervical fusion C2-6 (pt report) and chiari malformation. Pt was able to sit on physioball during UBE activity and using foam roll in supine. Pt will continue to benefit from skilled PT to reduce muscle tightness, increase AROM and strength cervical   Pt will benefit from skilled therapeutic intervention in order to improve on the following deficits Decreased range of motion;Increased muscle spasms;Decreased activity tolerance;Postural dysfunction;Impaired flexibility   Rehab Potential Good   PT Frequency 2x / week   PT Duration 8 weeks   PT Treatment/Interventions ADLs/Self Care Home Management;Cryotherapy;Electrical Stimulation;Moist Heat;Therapeutic exercise;Therapeutic activities;Functional mobility training;Ultrasound;Neuromuscular re-education;Patient/family education;Manual techniques;Passive range of motion   PT Next Visit Plan Continue with flexibility upper quadrant, strengthening, Ultrasound, manual to cervical/thoracic spine,    Consulted and Agree with Plan of Care Patient        Problem List Patient Active Problem List   Diagnosis Date Noted  . Complex sleep apnea syndrome 04/26/2015  . Hypoventilation associated with obesity syndrome ([place]) 04/26/2015  . OSA (obstructive sleep apnea) 02/05/2015  . Obesity hypoventilation syndrome ([place]) 12/25/2014  . Depression (emotion) 12/25/2014  . Hypersomnia with sleep apnea 12/25/2014  . Chronic tension-type headache, intractable 12/25/2014  . Arnold-Chiari malformation, type II ([place]) 12/25/2014  . Dysphagia 01/10/2013  . IBS (irritable bowel syndrome) 01/10/2013  . Chiari malformation 12/08/2012  . GERD (gastroesophageal reflux disease) 02/10/2012  . Allergic rhinitis due to pollen 02/10/2012  . Insomnia 02/10/2012  . B12 deficiency 02/10/2012  . BMI 40.0-44.9, adult ([place]) 02/10/2012  . Pseudomeningocele, acquired 06/11/2011  . OBESITY, UNSPECIFIED 02/10/2008  . DEPRESSION 02/10/2008  . CONSTIPATION 02/10/2008  . IRRITABLE  BOWEL SYNDROME 02/10/2008  . ENDOMETRIOSIS 02/10/2008    NAUMANN-HOUEGNIFIO,Arraya Buck PTA 09/26/2015, 8:45 AM  [facility] [address], [place], [place], [address] Phone: [phone]   Fax:  [phone]

## 2015-09-30 ENCOUNTER — Ambulatory Visit: Payer: 59

## 2015-09-30 DIAGNOSIS — R293 Abnormal posture: Secondary | ICD-10-CM

## 2015-09-30 DIAGNOSIS — M542 Cervicalgia: Secondary | ICD-10-CM | POA: Diagnosis not present

## 2015-09-30 NOTE — Therapy (Signed)
[facility] [address] [place], [place], [address] Phone: [phone]   Fax:  [phone]  Physical Therapy Treatment  Patient Details  Name: Erin Jimenez MRN: [id number] Date of Birth: 28-Feb-1971 Referring Provider: Jovita Gamma  Encounter Date: 09/30/2015      PT End of Session - 09/30/15 0848    Visit Number 7   Date for PT Re-Evaluation 10/29/15   PT Start Time 0804   PT Stop Time 0844   PT Time Calculation (min) 40 min   Activity Tolerance Patient tolerated treatment well   Behavior During Therapy [facility] for tasks assessed/performed      Past Medical History  Diagnosis Date  . Irritable bowel syndrome   . Chronic headache   . Chiari malformation   . GERD (gastroesophageal reflux disease)   . Endometriosis   . Obesity   . Depression   . B12 deficiency   . Insomnia   . Pseudomeningocele, acquired     Past Surgical History  Procedure Laterality Date  . Abdominal hysterectomy  2001  . Laparoscopic gastric banding  2006  . Spinal fusion    . Cholecystectomy    . Brain surgery      Chiari malformation  . Ercp  2000    There were no vitals filed for this visit.  Visit Diagnosis:  Neck pain  Posture abnormality      Subjective Assessment - 09/30/15 0808    Subjective 50% overall improvement since the start of care.     Pertinent History cervical fusion C2-6 in 2012, Chiari malformation.   Currently in Pain? Yes   Pain Score 3    Pain Location Neck   Pain Orientation Right;Left   Pain Descriptors / Indicators Burning   Pain Type Chronic pain   Aggravating Factors  laying down   Pain Relieving Factors getting up to walk around            [facility] PT Assessment - 09/30/15 0001    Assessment   Referring Provider Jovita Gamma                     [facility] Adult PT Treatment/Exercise - 09/30/15 0001    Neck Exercises: Machines for Strengthening   UBE (Upper Arm Bike) L  1 x 32min (3/3)  sitting on green physioball   Neck Exercises: Supine   Other Supine Exercise supine on foam roll x 3 minutes   Shoulder Exercises: Supine   Horizontal ABduction Strengthening;Both;10 reps   Theraband Level (Shoulder Horizontal ABduction) Level 2 (Red)   Other Supine Exercises Foam roll for elongation and decompression x 5 min   Shoulder Exercises: Seated   Other Seated Exercises seated on green ball: 2# 3 way raises 2x10 each   Ultrasound   Ultrasound Location bilateral UT   Ultrasound Parameters 1.2 w/cm2 cont x 8 minutes   Ultrasound Goals Pain   Manual Therapy   Manual Therapy Soft tissue mobilization  to bil UT and bil cervical paraspinals                  PT Short Term Goals - 09/30/15 0810    PT SHORT TERM GOAL #2   Title report a 25% reduction in neck/Rt UE pain with ADLs and work tasks   Status Achieved   PT SHORT TERM GOAL #3   Title report postural corrections with work tasks at least 50% of the time   Status Achieved  PT Long Term Goals - 09/24/15 0844    PT LONG TERM GOAL #1   Title be independent in advanced HEP   Time 8   Period Weeks   Status On-going   PT LONG TERM GOAL #2   Title reduce FOTO to < or = to 45% limitation   Time 8   Period Weeks   Status On-going   PT LONG TERM GOAL #3   Title report a 50% reduction in neck and thoracic pain with ADLs and work tasks   Time 8   Period Weeks   Status On-going               Plan - 09/30/15 8453    Clinical Impression Statement Pt reports 50% overall improvement since the start of care.  She is making postural corrections at work at least 50% of the time.  Pt is able to tolerate more exercise for strengthening.  Pt with chronic pain due to C2-6 fusion and is showing steady improvement of symptoms and will benefit from skilled PT for strength, manual and modalities.     Pt will benefit from skilled therapeutic intervention in order to improve on the following  deficits Decreased range of motion;Increased muscle spasms;Decreased activity tolerance;Postural dysfunction;Impaired flexibility   Rehab Potential Good   PT Frequency 2x / week   PT Duration 8 weeks   PT Next Visit Plan Continue with flexibility upper quadrant, strengthening, Ultrasound, manual to cervical/thoracic spine,    Consulted and Agree with Plan of Care Patient        Problem List Patient Active Problem List   Diagnosis Date Noted  . Complex sleep apnea syndrome 04/26/2015  . Hypoventilation associated with obesity syndrome ([place]) 04/26/2015  . OSA (obstructive sleep apnea) 02/05/2015  . Obesity hypoventilation syndrome ([place]) 12/25/2014  . Depression (emotion) 12/25/2014  . Hypersomnia with sleep apnea 12/25/2014  . Chronic tension-type headache, intractable 12/25/2014  . Arnold-Chiari malformation, type II ([place]) 12/25/2014  . Dysphagia 01/10/2013  . IBS (irritable bowel syndrome) 01/10/2013  . Chiari malformation 12/08/2012  . GERD (gastroesophageal reflux disease) 02/10/2012  . Allergic rhinitis due to pollen 02/10/2012  . Insomnia 02/10/2012  . B12 deficiency 02/10/2012  . BMI 40.0-44.9, adult ([place]) 02/10/2012  . Pseudomeningocele, acquired 06/11/2011  . OBESITY, UNSPECIFIED 02/10/2008  . DEPRESSION 02/10/2008  . CONSTIPATION 02/10/2008  . IRRITABLE BOWEL SYNDROME 02/10/2008  . ENDOMETRIOSIS 02/10/2008    TAKACS,KELLY, PT 09/30/2015, 8:49 AM  [facility] [address] [place], [place], [address] Phone: [phone]   Fax:  [phone]  Name: Erin Jimenez MRN: [id number] Date of Birth: December 01, 1971

## 2015-10-02 ENCOUNTER — Ambulatory Visit: Payer: 59

## 2015-10-02 DIAGNOSIS — M542 Cervicalgia: Secondary | ICD-10-CM

## 2015-10-02 DIAGNOSIS — R293 Abnormal posture: Secondary | ICD-10-CM

## 2015-10-02 DIAGNOSIS — M25521 Pain in right elbow: Secondary | ICD-10-CM

## 2015-10-02 NOTE — Therapy (Signed)
[facility] [address], [place], [place], [address] Phone: [phone]   Fax:  [phone]  Physical Therapy Treatment  Patient Details  Name: Erin Jimenez MRN: [id number] Date of Birth: [birth year] Referring Provider: Jovita Gamma  Encounter Date: 10/02/2015      PT End of Session - 10/02/15 0843    Visit Number 8   Date for PT Re-Evaluation 10/29/15   PT Start Time 0804   PT Stop Time 0843   PT Time Calculation (min) 39 min   Activity Tolerance Patient tolerated treatment well   Behavior During Therapy [facility] for tasks assessed/performed      Past Medical History  Diagnosis Date  . Irritable bowel syndrome   . Chronic headache   . Chiari malformation   . GERD (gastroesophageal reflux disease)   . Endometriosis   . Obesity   . Depression   . B12 deficiency   . Insomnia   . Pseudomeningocele, acquired     Past Surgical History  Procedure Laterality Date  . Abdominal hysterectomy  2001  . Laparoscopic gastric banding  2006  . Spinal fusion    . Cholecystectomy    . Brain surgery      Chiari malformation  . Ercp  2000    There were no vitals filed for this visit.  Visit Diagnosis:  Neck pain  Posture abnormality  Pain in joint, upper arm, right      Subjective Assessment - 10/02/15 0808    Subjective Pt reports no pain over the past few days.  Doing great!   Currently in Pain? No/denies                         [facility] Adult PT Treatment/Exercise - 10/02/15 0001    Neck Exercises: Machines for Strengthening   UBE (Upper Arm Bike) L 1 x 94min (3/3)  sitting on green physioball   Neck Exercises: Supine   Other Supine Exercise supine on foam roll x 3 minutes   Shoulder Exercises: Supine   Horizontal ABduction Strengthening;Both;10 reps   Theraband Level (Shoulder Horizontal ABduction) Level 2 (Red)   Other Supine Exercises Foam roll for elongation and decompression x 5  min   Shoulder Exercises: Seated   Other Seated Exercises seated on green ball: 2# 3 way raises 2x10 each   Ultrasound   Ultrasound Location bilateral upper trap   Ultrasound Parameters 1.2 w/cm2 cont to bilateral UT and cervical paraspinals   Ultrasound Goals Pain   Manual Therapy   Manual Therapy Soft tissue mobilization  to bil UT and bil cervical paraspinals                  PT Short Term Goals - 09/30/15 0810    PT SHORT TERM GOAL #2   Title report a 25% reduction in neck/Rt UE pain with ADLs and work tasks   Status Achieved   PT SHORT TERM GOAL #3   Title report postural corrections with work tasks at least 50% of the time   Status Achieved           PT Long Term Goals - 09/24/15 0844    PT LONG TERM GOAL #1   Title be independent in advanced HEP   Time 8   Period Weeks   Status On-going   PT LONG TERM GOAL #2   Title reduce FOTO to < or = to 45% limitation   Time 8  Period Weeks   Status On-going   PT LONG TERM GOAL #3   Title report a 50% reduction in neck and thoracic pain with ADLs and work tasks   Time 8   Period Weeks   Status On-going               Plan - 10/02/15 8921    Clinical Impression Statement Pt reports 50% overall improvement since the start of care.  Pt is making postural corrections at work at least 50% of the time.  Pt without pain this week at work.  Pt with chronic pain due to C2-6 fusion and is showing steady improvement of symptoms and will benefit from skilled PT for strength, manual and modalities.     Pt will benefit from skilled therapeutic intervention in order to improve on the following deficits Decreased range of motion;Increased muscle spasms;Decreased activity tolerance;Postural dysfunction;Impaired flexibility   Rehab Potential Good   PT Frequency 2x / week   PT Duration 8 weeks   PT Treatment/Interventions ADLs/Self Care Home Management;Cryotherapy;Electrical Stimulation;Moist Heat;Therapeutic  exercise;Therapeutic activities;Functional mobility training;Ultrasound;Neuromuscular re-education;Patient/family education;Manual techniques;Passive range of motion   PT Next Visit Plan Continue with flexibility upper quadrant, strengthening, Ultrasound, manual to cervical/thoracic spine,    Consulted and Agree with Plan of Care Patient        Problem List Patient Active Problem List   Diagnosis Date Noted  . Complex sleep apnea syndrome 04/26/2015  . Hypoventilation associated with obesity syndrome ([place]) 04/26/2015  . OSA (obstructive sleep apnea) 02/05/2015  . Obesity hypoventilation syndrome ([place]) 12/25/2014  . Depression (emotion) 12/25/2014  . Hypersomnia with sleep apnea 12/25/2014  . Chronic tension-type headache, intractable 12/25/2014  . Arnold-Chiari malformation, type II ([place]) 12/25/2014  . Dysphagia 01/10/2013  . IBS (irritable bowel syndrome) 01/10/2013  . Chiari malformation 12/08/2012  . GERD (gastroesophageal reflux disease) 02/10/2012  . Allergic rhinitis due to pollen 02/10/2012  . Insomnia 02/10/2012  . B12 deficiency 02/10/2012  . BMI 40.0-44.9, adult ([place]) 02/10/2012  . Pseudomeningocele, acquired 06/11/2011  . OBESITY, UNSPECIFIED 02/10/2008  . DEPRESSION 02/10/2008  . CONSTIPATION 02/10/2008  . IRRITABLE BOWEL SYNDROME 02/10/2008  . ENDOMETRIOSIS 02/10/2008    TAKACS,KELLY, PT 10/02/2015, 8:44 AM  [facility] [address], [place], [place], [address] Phone: [phone]   Fax:  [phone]  Name: Erin Jimenez MRN: [id number] Date of Birth: 04-Jan-1971

## 2015-10-07 ENCOUNTER — Ambulatory Visit: Payer: 59

## 2015-10-07 DIAGNOSIS — R293 Abnormal posture: Secondary | ICD-10-CM

## 2015-10-07 DIAGNOSIS — M542 Cervicalgia: Secondary | ICD-10-CM | POA: Diagnosis not present

## 2015-10-07 NOTE — Therapy (Signed)
[facility] [address], [place], [place], [address] Phone: [phone]   Fax:  [phone]  Physical Therapy Treatment  Patient Details  Name: Erin Jimenez MRN: [id number] Date of Birth: 12/31/1970 Referring Provider: Jovita Gamma  Encounter Date: 10/07/2015      PT End of Session - 10/07/15 0844    Visit Number 9   Date for PT Re-Evaluation 10/29/15   PT Start Time 0804   PT Stop Time 0844   PT Time Calculation (min) 40 min   Activity Tolerance Patient tolerated treatment well   Behavior During Therapy [facility] for tasks assessed/performed      Past Medical History  Diagnosis Date  . Irritable bowel syndrome   . Chronic headache   . Chiari malformation   . GERD (gastroesophageal reflux disease)   . Endometriosis   . Obesity   . Depression   . B12 deficiency   . Insomnia   . Pseudomeningocele, acquired     Past Surgical History  Procedure Laterality Date  . Abdominal hysterectomy  2001  . Laparoscopic gastric banding  2006  . Spinal fusion    . Cholecystectomy    . Brain surgery      Chiari malformation  . Ercp  2000    There were no vitals filed for this visit.  Visit Diagnosis:  Neck pain  Posture abnormality      Subjective Assessment - 10/07/15 0810    Subjective Pt reports that she has been doing really well with pain.  Pt reports that she has been using heat at home.     Pertinent History cervical fusion C2-6 in 2012, Chiari malformation.   Currently in Pain? Yes   Pain Score 1    Pain Location Neck   Pain Orientation Right;Left   Pain Descriptors / Indicators Aching;Burning   Pain Onset More than a month ago   Pain Frequency Constant                         [facility] Adult PT Treatment/Exercise - 10/07/15 0001    Neck Exercises: Machines for Strengthening   UBE (Upper Arm Bike) L 1 x 56min (3/3)   Neck Exercises: Seated   Other Seated Exercise seated on green  ball: horizonal abduction red 2x10   Shoulder Exercises: Seated   Other Seated Exercises seated on green ball: 2# 3 way raises 2x10 each   Ultrasound   Ultrasound Location bilateral UT   Ultrasound Parameters 1.2 w/cm2 cont x8 minutes   Ultrasound Goals Pain   Manual Therapy   Manual Therapy Soft tissue mobilization   Manual therapy comments to bil UT and bil cervical paraspinals                  PT Short Term Goals - 09/30/15 0810    PT SHORT TERM GOAL #2   Title report a 25% reduction in neck/Rt UE pain with ADLs and work tasks   Status Achieved   PT SHORT TERM GOAL #3   Title report postural corrections with work tasks at least 50% of the time   Status Achieved           PT Long Term Goals - 10/07/15 3016    PT LONG TERM GOAL #1   Title be independent in advanced HEP   Time 8   Period Weeks   Status On-going   PT LONG TERM GOAL #2   Title  reduce FOTO to < or = to 45% limitation   Time 8   Period Weeks   Status On-going   PT LONG TERM GOAL #3   Title report a 50% reduction in neck and thoracic pain with ADLs and work tasks   Status Achieved               Plan - 10/07/15 0817    Clinical Impression Statement Pt reports 75% improvement since the start of care.  She is using heat to control pain and reports 2/10 neck pain on average.  Pt with chronic neck pain due to C2-6 fusion and is showing consistent improvement of symptoms and will continue to beneift from skilled PT for strength, manual and modalities.     Pt will benefit from skilled therapeutic intervention in order to improve on the following deficits Decreased range of motion;Increased muscle spasms;Decreased activity tolerance;Postural dysfunction;Impaired flexibility   Rehab Potential Good   PT Frequency 2x / week   PT Treatment/Interventions ADLs/Self Care Home Management;Cryotherapy;Electrical Stimulation;Moist Heat;Therapeutic exercise;Therapeutic activities;Functional mobility  training;Ultrasound;Neuromuscular re-education;Patient/family education;Manual techniques;Passive range of motion   PT Next Visit Plan Continue with flexibility upper quadrant, strengthening, Ultrasound, manual to cervical/thoracic spine,    Consulted and Agree with Plan of Care Patient        Problem List Patient Active Problem List   Diagnosis Date Noted  . Complex sleep apnea syndrome 04/26/2015  . Hypoventilation associated with obesity syndrome ([place]) 04/26/2015  . OSA (obstructive sleep apnea) 02/05/2015  . Obesity hypoventilation syndrome ([name]) 12/25/2014  . Depression (emotion) 12/25/2014  . Hypersomnia with sleep apnea 12/25/2014  . Chronic tension-type headache, intractable 12/25/2014  . Arnold-Chiari malformation, type II ([place]) 12/25/2014  . Dysphagia 01/10/2013  . IBS (irritable bowel syndrome) 01/10/2013  . Chiari malformation 12/08/2012  . GERD (gastroesophageal reflux disease) 02/10/2012  . Allergic rhinitis due to pollen 02/10/2012  . Insomnia 02/10/2012  . B12 deficiency 02/10/2012  . BMI 40.0-44.9, adult ([place]) 02/10/2012  . Pseudomeningocele, acquired 06/11/2011  . OBESITY, UNSPECIFIED 02/10/2008  . DEPRESSION 02/10/2008  . CONSTIPATION 02/10/2008  . IRRITABLE BOWEL SYNDROME 02/10/2008  . ENDOMETRIOSIS 02/10/2008    Erin Jimenez, PT 10/07/2015, 8:46 AM  [facility] [address] [place], [place], [address] Phone: [phone]   Fax:  [phone]  Name: Erin Jimenez MRN: [id number] Date of Birth: Aug 08, 1971

## 2015-10-09 ENCOUNTER — Encounter: Payer: Self-pay | Admitting: Physical Therapy

## 2015-10-09 ENCOUNTER — Ambulatory Visit: Payer: 59 | Admitting: Physical Therapy

## 2015-10-09 DIAGNOSIS — M542 Cervicalgia: Secondary | ICD-10-CM

## 2015-10-09 DIAGNOSIS — R293 Abnormal posture: Secondary | ICD-10-CM

## 2015-10-09 DIAGNOSIS — M25521 Pain in right elbow: Secondary | ICD-10-CM

## 2015-10-09 NOTE — Therapy (Signed)
[facility] [address], [place], [place], [address] Phone: [phone]   Fax:  [phone]  Physical Therapy Treatment  Patient Details  Name: Erin Jimenez MRN: [id number] Date of Birth: [birth year] Referring Provider: Jovita Gamma  Encounter Date: 10/09/2015      PT End of Session - 10/09/15 0821    Visit Number 10   Date for PT Re-Evaluation 10/29/15   PT Start Time 0805   PT Stop Time 0846   PT Time Calculation (min) 41 min   Activity Tolerance Patient tolerated treatment well   Behavior During Therapy [facility] for tasks assessed/performed      Past Medical History  Diagnosis Date  . Irritable bowel syndrome   . Chronic headache   . Chiari malformation   . GERD (gastroesophageal reflux disease)   . Endometriosis   . Obesity   . Depression   . B12 deficiency   . Insomnia   . Pseudomeningocele, acquired     Past Surgical History  Procedure Laterality Date  . Abdominal hysterectomy  2001  . Laparoscopic gastric banding  2006  . Spinal fusion    . Cholecystectomy    . Brain surgery      Chiari malformation  . Ercp  2000    There were no vitals filed for this visit.  Visit Diagnosis:  Neck pain  Posture abnormality  Pain in joint, upper arm, right      Subjective Assessment - 10/09/15 0811    Subjective Just a little bit of pain rated as2/10 in neck mailny on right side.    Currently in Pain? Yes   Pain Score 2    Pain Location Neck   Pain Orientation Right;Left   Pain Descriptors / Indicators Aching;Burning   Pain Type Chronic pain   Pain Radiating Towards Rt UE   Pain Onset More than a month ago   Pain Frequency Constant   Aggravating Factors  laying down   Pain Relieving Factors changing position, getting up and walk around   Multiple Pain Sites No                         [facility] Adult PT Treatment/Exercise - 10/09/15 0001    Exercises   Exercises Neck;Shoulder    Neck Exercises: Machines for Strengthening   UBE (Upper Arm Bike) L 1 x 39min (3/3)   Neck Exercises: Seated   Other Seated Exercise seated on green ball: horizonal abduction red 2x10   Other Seated Exercise D2 red band  2x10   Shoulder Exercises: Seated   Other Seated Exercises seated on green ball: 2# 3 way raises 2x10 each   Modalities   Modalities Ultrasound   Ultrasound   Ultrasound Location bil. UT & cervical paraspinals   Ultrasound Parameters 100%, 1Mhz, 1W/cm   Ultrasound Goals Pain   Manual Therapy   Manual Therapy Soft tissue mobilization   Manual therapy comments to bil UT and bil cervical paraspinals  in supine                  PT Short Term Goals - 09/30/15 0810    PT SHORT TERM GOAL #2   Title report a 25% reduction in neck/Rt UE pain with ADLs and work tasks   Status Achieved   PT SHORT TERM GOAL #3   Title report postural corrections with work tasks at least 50% of the time   Status Achieved  PT Long Term Goals - 10/07/15 1829    PT LONG TERM GOAL #1   Title be independent in advanced HEP   Time 8   Period Weeks   Status On-going   PT LONG TERM GOAL #2   Title reduce FOTO to < or = to 45% limitation   Time 8   Period Weeks   Status On-going   PT LONG TERM GOAL #3   Title report a 50% reduction in neck and thoracic pain with ADLs and work tasks   Status Achieved               Plan - 10/09/15 0821    Clinical Impression Statement Pt continues to improve and start of care. She is using heat to control pain and reports neck pain in average is 2/10 and is much better than before start of PT.    Clinical Impairments Affecting Rehab Potential Pt with chronic neck pain due to C2-6 fusion and chiari malformation   PT Frequency 2x / week   PT Duration 8 weeks   PT Treatment/Interventions ADLs/Self Care Home Management;Cryotherapy;Electrical Stimulation;Moist Heat;Therapeutic exercise;Therapeutic activities;Functional mobility  training;Ultrasound;Neuromuscular re-education;Patient/family education;Manual techniques;Passive range of motion   PT Next Visit Plan Continue with flexibility upper quadrant, strengthening, Ultrasound, manual to cervical/thoracic spine,    Consulted and Agree with Plan of Care Patient        Problem List Patient Active Problem List   Diagnosis Date Noted  . Complex sleep apnea syndrome 04/26/2015  . Hypoventilation associated with obesity syndrome ([place]) 04/26/2015  . OSA (obstructive sleep apnea) 02/05/2015  . Obesity hypoventilation syndrome ([place]) 12/25/2014  . Depression (emotion) 12/25/2014  . Hypersomnia with sleep apnea 12/25/2014  . Chronic tension-type headache, intractable 12/25/2014  . Arnold-Chiari malformation, type II ([place]) 12/25/2014  . Dysphagia 01/10/2013  . IBS (irritable bowel syndrome) 01/10/2013  . Chiari malformation 12/08/2012  . GERD (gastroesophageal reflux disease) 02/10/2012  . Allergic rhinitis due to pollen 02/10/2012  . Insomnia 02/10/2012  . B12 deficiency 02/10/2012  . BMI 40.0-44.9, adult ([place]) 02/10/2012  . Pseudomeningocele, acquired 06/11/2011  . OBESITY, UNSPECIFIED 02/10/2008  . DEPRESSION 02/10/2008  . CONSTIPATION 02/10/2008  . IRRITABLE BOWEL SYNDROME 02/10/2008  . ENDOMETRIOSIS 02/10/2008    NAUMANN-HOUEGNIFIO,Saba Neuman PTA 10/09/2015, 8:48 AM  [facility] [address] [place], [place], [address] Phone: [phone]   Fax:  [phone]  Name: KAMBRY TAKACS MRN: [id number] Date of Birth: [birth year]

## 2015-10-11 ENCOUNTER — Other Ambulatory Visit: Payer: Self-pay | Admitting: Internal Medicine

## 2015-10-15 ENCOUNTER — Encounter: Payer: Self-pay | Admitting: Physical Therapy

## 2015-10-15 ENCOUNTER — Ambulatory Visit: Payer: 59 | Attending: Neurosurgery | Admitting: Physical Therapy

## 2015-10-15 DIAGNOSIS — R293 Abnormal posture: Secondary | ICD-10-CM | POA: Insufficient documentation

## 2015-10-15 DIAGNOSIS — M542 Cervicalgia: Secondary | ICD-10-CM | POA: Insufficient documentation

## 2015-10-15 DIAGNOSIS — M25521 Pain in right elbow: Secondary | ICD-10-CM

## 2015-10-15 DIAGNOSIS — M79621 Pain in right upper arm: Secondary | ICD-10-CM | POA: Insufficient documentation

## 2015-10-15 NOTE — Patient Instructions (Signed)
Resisted Shoulder Flexion: Bilateral    Face anchor, tubing end in each hand. With arms straight out in front, pinch shoulder blades together and raise one arm over head. Repeat __10__ times per set. Do __1__ sets per session. Do __1__ sessions per day.  http://orth.exer.us/965   Copyright  VHI. All rights reserved.   Abduction: [place] - Thumb Up (Dumbbell)    Lift arms out from sides, thumbs up. Or you can place the yellow band under feet and pull with hands. Repeat _10___ times per set. Do __1__ sets per session. Do _1___ sessions per week. Use __2__ lb weights.   Copyright  VHI. All rights reserved.   [facility] [address] [place], [place] [address] Phone # [phone] Fax [phone]

## 2015-10-15 NOTE — Therapy (Signed)
[facility] [address] [place], [place], [address] Phone: [phone]   Fax:  [phone]  Physical Therapy Treatment  Patient Details  Name: Erin Jimenez MRN: [id number] Date of Birth: 07-Aug-1971 Referring Provider: Dr. Jovita Gamma  Encounter Date: 10/15/2015      PT End of Session - 10/15/15 0807    Visit Number 11   Date for PT Re-Evaluation 10/29/15   Authorization Type co-insurance   Authorization - Visit Number 11   Authorization - Number of Visits 35   PT Start Time 0800   PT Stop Time 0843   PT Time Calculation (min) 43 min   Activity Tolerance Patient tolerated treatment well   Behavior During Therapy [facility] for tasks assessed/performed      Past Medical History  Diagnosis Date  . Irritable bowel syndrome   . Chronic headache   . Chiari malformation   . GERD (gastroesophageal reflux disease)   . Endometriosis   . Obesity   . Depression   . B12 deficiency   . Insomnia   . Pseudomeningocele, acquired     Past Surgical History  Procedure Laterality Date  . Abdominal hysterectomy  2001  . Laparoscopic gastric banding  2006  . Spinal fusion    . Cholecystectomy    . Brain surgery      Chiari malformation  . Ercp  2000    There were no vitals filed for this visit.  Visit Diagnosis:  Neck pain  Posture abnormality  Pain in joint, upper arm, right      Subjective Assessment - 10/15/15 0809    Subjective Patient reports her pain is 75% better.  Patient is now able to turn her head to the right. I feel better.     Pertinent History cervical fusion C2-6 in 2012, Chiari malformation.   Diagnostic tests MRI: no acute findings-see attached report   Patient Stated Goals reduce pain   Currently in Pain? Yes   Pain Score 2    Pain Location Neck   Pain Orientation Right;Left   Pain Descriptors / Indicators Aching;Burning   Pain Type Chronic pain   Pain Radiating Towards right UE   Pain  Onset More than a month ago   Pain Frequency Constant   Aggravating Factors  laying down to sleep at night; being in a static position   Pain Relieving Factors getting up and walking around   Multiple Pain Sites No            [facility] PT Assessment - 10/15/15 0001    Assessment   Medical Diagnosis cervicalgia (M54.2)   Referring Provider Dr. Jovita Gamma   Onset Date/Surgical Date 09/03/11   Hand Dominance Right   Next MD Visit 11/2015                     [facility] Adult PT Treatment/Exercise - 10/15/15 0001    Neck Exercises: Machines for Strengthening   UBE (Upper Arm Bike) L 1 x 55min (3/3)   Neck Exercises: Seated   Other Seated Exercise seated on green ball: horizonal abduction red 2x10   Other Seated Exercise D2 red band  2x10   Shoulder Exercises: Seated   Other Seated Exercises seated on green ball: 2# 3 way raises 2x10 each   Ultrasound   Ultrasound Location bil. UT & cervical paraspinals   Ultrasound Parameters 100%, 1.2 w/cm2, 1 mhz, 100%   Ultrasound Goals Pain  PT Education - 10/15/15 0840    Education provided Yes   Education Details theraband exercises overhead   Person(s) Educated Patient   Methods Explanation;Demonstration;Handout   Comprehension Returned demonstration;Verbalized understanding          PT Short Term Goals - 10/15/15 0815    PT SHORT TERM GOAL #1   Title be independent in initial HEP   Time 4   Period Weeks   Status Achieved   PT SHORT TERM GOAL #2   Title report a 25% reduction in neck/Rt UE pain with ADLs and work tasks   Time 4   Period Weeks   Status Achieved   PT SHORT TERM GOAL #3   Title report postural corrections with work tasks at least 50% of the time   Time 4   Period Weeks   Status Achieved           PT Long Term Goals - 10/15/15 0815    PT LONG TERM GOAL #1   Title be independent in advanced HEP   Time 8   Period Weeks   Status On-going   PT LONG TERM GOAL #2   Title  reduce FOTO to < or = to 45% limitation   Time 8   Period Weeks   Status On-going   PT LONG TERM GOAL #3   Title report a 50% reduction in neck and thoracic pain with ADLs and work tasks   Time 8   Period Weeks   Status Achieved  75% better               Plan - 10/15/15 0841    Clinical Impression Statement Patient pain has decreased by 75%.  Patient can turn her head to the right with greater ease.  Patient pain is worse when going to sleep.  Patient needs overhead strengthening.  Patient has her computer set up at work correctly but needs a hands freee head set.  Patient is still learning new exercises to strength overhead.  Patient will benefit from physical therapy to increase strength and reduce muscle tendion to decrease pain.    Pt will benefit from skilled therapeutic intervention in order to improve on the following deficits Decreased range of motion;Increased muscle spasms;Decreased activity tolerance;Postural dysfunction;Impaired flexibility   Rehab Potential Good   Clinical Impairments Affecting Rehab Potential Pt with chronic neck pain due to C2-6 fusion and chiari malformation   PT Frequency 2x / week   PT Duration 8 weeks   PT Treatment/Interventions ADLs/Self Care Home Management;Cryotherapy;Electrical Stimulation;Moist Heat;Therapeutic exercise;Therapeutic activities;Functional mobility training;Ultrasound;Neuromuscular re-education;Patient/family education;Manual techniques;Passive range of motion   PT Next Visit Plan soft tissue work to cervical, ultrasound, overhead strengthening   PT Home Exercise Plan progress as needed   Recommended Other Services None   Consulted and Agree with Plan of Care Patient        Problem List Patient Active Problem List   Diagnosis Date Noted  . Complex sleep apnea syndrome 04/26/2015  . Hypoventilation associated with obesity syndrome ([place]) 04/26/2015  . OSA (obstructive sleep apnea) 02/05/2015  . Obesity hypoventilation  syndrome ([place]) 12/25/2014  . Depression (emotion) 12/25/2014  . Hypersomnia with sleep apnea 12/25/2014  . Chronic tension-type headache, intractable 12/25/2014  . Arnold-Chiari malformation, type II ([place]) 12/25/2014  . Dysphagia 01/10/2013  . IBS (irritable bowel syndrome) 01/10/2013  . Chiari malformation 12/08/2012  . GERD (gastroesophageal reflux disease) 02/10/2012  . Allergic rhinitis due to pollen 02/10/2012  . Insomnia 02/10/2012  . B12  deficiency 02/10/2012  . BMI 40.0-44.9, adult ([place]) 02/10/2012  . Pseudomeningocele, acquired 06/11/2011  . OBESITY, UNSPECIFIED 02/10/2008  . DEPRESSION 02/10/2008  . CONSTIPATION 02/10/2008  . IRRITABLE BOWEL SYNDROME 02/10/2008  . ENDOMETRIOSIS 02/10/2008    GRAY,CHERYL,PT 10/15/2015, 8:45 AM  [facility] [address], [place], [place], [address] Phone: [phone]   Fax:  [phone]  Name: Erin Jimenez MRN: [id number] Date of Birth: 15-Jul-1971

## 2015-10-17 ENCOUNTER — Ambulatory Visit: Payer: 59 | Admitting: Physical Therapy

## 2015-10-23 ENCOUNTER — Ambulatory Visit: Payer: 59 | Admitting: Physical Therapy

## 2015-10-23 ENCOUNTER — Encounter: Payer: Self-pay | Admitting: Physical Therapy

## 2015-10-23 DIAGNOSIS — M25521 Pain in right elbow: Secondary | ICD-10-CM

## 2015-10-23 DIAGNOSIS — M542 Cervicalgia: Secondary | ICD-10-CM

## 2015-10-23 DIAGNOSIS — R293 Abnormal posture: Secondary | ICD-10-CM

## 2015-10-23 NOTE — Therapy (Signed)
[facility] [address] [place], [place], [address] Phone: [phone]   Fax:  [phone]  Physical Therapy Treatment  Patient Details  Name: Erin Jimenez MRN: [id number] Date of Birth: [birth year] Referring Provider: Dr. Jovita Gamma  Encounter Date: 10/23/2015      PT End of Session - 10/23/15 0822    Visit Number 12   Date for PT Re-Evaluation 10/29/15   Authorization Type co-insurance   Authorization - Visit Number 12   Authorization - Number of Visits 36   PT Start Time 0806   PT Stop Time 0844   PT Time Calculation (min) 38 min   Activity Tolerance Patient tolerated treatment well   Behavior During Therapy [facility] for tasks assessed/performed      Past Medical History  Diagnosis Date  . Irritable bowel syndrome   . Chronic headache   . Chiari malformation   . GERD (gastroesophageal reflux disease)   . Endometriosis   . Obesity   . Depression   . B12 deficiency   . Insomnia   . Pseudomeningocele, acquired     Past Surgical History  Procedure Laterality Date  . Abdominal hysterectomy  2001  . Laparoscopic gastric banding  2006  . Spinal fusion    . Cholecystectomy    . Brain surgery      Chiari malformation  . Ercp  2000    There were no vitals filed for this visit.  Visit Diagnosis:  Neck pain  Posture abnormality  Pain in joint, upper arm, right      Subjective Assessment - 10/23/15 0810    Subjective Pt reports turning her head to right is improving as noticed while driving in the car (in pt available rang). Is not more cramping up as often.   Currently in Pain? Yes   Pain Score 1    Pain Location Neck   Pain Orientation Right;Left   Pain Descriptors / Indicators Aching;Burning   Pain Type Chronic pain   Pain Onset More than a month ago   Pain Frequency Intermittent   Multiple Pain Sites No                         [facility] Adult PT Treatment/Exercise -  10/23/15 0001    Posture/Postural Control   Posture/Postural Control Postural limitations   Postural Limitations Forward head;Rounded Shoulders   Exercises   Exercises Neck;Shoulder   Neck Exercises: Machines for Strengthening   UBE (Upper Arm Bike) L2 x 28min (3/3)   Neck Exercises: Standing   Neck Retraction 20 reps  pushing into blue ball   Neck Exercises: Seated   Other Seated Exercise seated on green ball: horizonal abduction red 2x10   Other Seated Exercise D2 red band  2x10   Shoulder Exercises: Seated   Other Seated Exercises seated on green ball: 2# 3 way raises 2x10 each   Manual Therapy   Manual Therapy Soft tissue mobilization   Manual therapy comments to bil UT and bil cervical paraspinals  in supine                  PT Short Term Goals - 10/15/15 0815    PT SHORT TERM GOAL #1   Title be independent in initial HEP   Time 4   Period Weeks   Status Achieved   PT SHORT TERM GOAL #2   Title report a 25% reduction in neck/Rt UE pain with ADLs  and work tasks   Time 4   Period Weeks   Status Achieved   PT SHORT TERM GOAL #3   Title report postural corrections with work tasks at least 50% of the time   Time 4   Period Weeks   Status Achieved           PT Long Term Goals - 10/23/15 0851    PT LONG TERM GOAL #1   Title be independent in advanced HEP   Time 8   Period Weeks   Status On-going   PT LONG TERM GOAL #2   Title reduce FOTO to < or = to 45% limitation   Time 8   Period Weeks   Status On-going   PT LONG TERM GOAL #3   Time 8   Period Weeks   Status Achieved               Plan - 10/23/15 9833    Clinical Impression Statement Pt with imporved activity tolerance as noticed with activititeis in PT clinic. Pt has one more appointment before D/C and her travel on a cruise.    Pt will benefit from skilled therapeutic intervention in order to improve on the following deficits Decreased range of motion;Increased muscle  spasms;Decreased activity tolerance;Postural dysfunction;Impaired flexibility   Rehab Potential Good   Clinical Impairments Affecting Rehab Potential Pt with chronic neck pain due to C2-6 fusion and chiari malformation   PT Frequency 2x / week   PT Duration 8 weeks   PT Treatment/Interventions ADLs/Self Care Home Management;Cryotherapy;Electrical Stimulation;Moist Heat;Therapeutic exercise;Therapeutic activities;Functional mobility training;Ultrasound;Neuromuscular re-education;Patient/family education;Manual techniques;Passive range of motion   PT Next Visit Plan D/C  and FOTO   PT Home Exercise Plan progress as needed   Consulted and Agree with Plan of Care Patient        Problem List Patient Active Problem List   Diagnosis Date Noted  . Complex sleep apnea syndrome 04/26/2015  . Hypoventilation associated with obesity syndrome ([place]) 04/26/2015  . OSA (obstructive sleep apnea) 02/05/2015  . Obesity hypoventilation syndrome ([place]) 12/25/2014  . Depression (emotion) 12/25/2014  . Hypersomnia with sleep apnea 12/25/2014  . Chronic tension-type headache, intractable 12/25/2014  . Arnold-Chiari malformation, type II ([place]) 12/25/2014  . Dysphagia 01/10/2013  . IBS (irritable bowel syndrome) 01/10/2013  . Chiari malformation 12/08/2012  . GERD (gastroesophageal reflux disease) 02/10/2012  . Allergic rhinitis due to pollen 02/10/2012  . Insomnia 02/10/2012  . B12 deficiency 02/10/2012  . BMI 40.0-44.9, adult ([place]) 02/10/2012  . Pseudomeningocele, acquired 06/11/2011  . OBESITY, UNSPECIFIED 02/10/2008  . DEPRESSION 02/10/2008  . CONSTIPATION 02/10/2008  . IRRITABLE BOWEL SYNDROME 02/10/2008  . ENDOMETRIOSIS 02/10/2008    NAUMANN-HOUEGNIFIO,Maximilliano Kersh PTA 10/23/2015, 5:49 PM  [facility] [address] [place], [place], [address] Phone: [phone]   Fax:  [phone]  Name: DIANARA SMULLEN MRN: [id number] Date of Birth:  September 02, 1971

## 2015-10-25 ENCOUNTER — Encounter: Payer: Self-pay | Admitting: Physical Therapy

## 2015-10-25 ENCOUNTER — Ambulatory Visit: Payer: 59 | Admitting: Physical Therapy

## 2015-10-25 DIAGNOSIS — M542 Cervicalgia: Secondary | ICD-10-CM | POA: Diagnosis not present

## 2015-10-25 DIAGNOSIS — M25521 Pain in right elbow: Secondary | ICD-10-CM

## 2015-10-25 DIAGNOSIS — R293 Abnormal posture: Secondary | ICD-10-CM

## 2015-10-25 NOTE — Therapy (Signed)
[facility] [address] [place], [place], [address] Phone: [phone]   Fax:  [phone]  Physical Therapy Treatment  Patient Details  Name: Erin Jimenez MRN: [id number] Date of Birth: 01/20/1971 Referring Provider: Dr. Robert Nudelman  Encounter Date: 10/25/2015      PT End of Session - 10/25/15 0811    Visit Number 13   Date for PT Re-Evaluation 10/29/15   Authorization Type co-insurance   Authorization - Visit Number 13   Authorization - Number of Visits 36   PT Start Time 0805   PT Stop Time 0905   PT Time Calculation (min) 60 min   Activity Tolerance Patient tolerated treatment well   Behavior During Therapy WFL for tasks assessed/performed      Past Medical History  Diagnosis Date  . Irritable bowel syndrome   . Chronic headache   . Chiari malformation   . GERD (gastroesophageal reflux disease)   . Endometriosis   . Obesity   . Depression   . B12 deficiency   . Insomnia   . Pseudomeningocele, acquired     Past Surgical History  Procedure Laterality Date  . Abdominal hysterectomy  2001  . Laparoscopic gastric banding  2006  . Spinal fusion    . Cholecystectomy    . Brain surgery      Chiari malformation  . Ercp  2000    There were no vitals filed for this visit.  Visit Diagnosis:  Neck pain  Posture abnormality  Pain in joint, upper arm, right      Subjective Assessment - 10/25/15 0813    Subjective Patient reports pain decreased by 80%.  No pain in the center of her back. Patient can now lay down without pain in the center of her back.    Pertinent History cervical fusion C2-6 in 2012, Chiari malformation.   Diagnostic tests MRI: no acute findings-see attached report   Patient Stated Goals reduce pain   Currently in Pain? Yes   Pain Score 3    Pain Location Neck  base   Pain Orientation Mid   Pain Descriptors / Indicators Dull   Pain Type Chronic pain   Pain Radiating  Towards numb in right arm and shoulder   Pain Onset More than a month ago   Pain Frequency Constant   Aggravating Factors  Not sure now   Pain Relieving Factors getting up and walking around   Multiple Pain Sites No            [facility] PT Assessment - 10/25/15 0001    Assessment   Medical Diagnosis cervicalgia (M54.2)   Referring Provider Dr. Robert Nudelman   Onset Date/Surgical Date 09/03/11   Hand Dominance Right   Next MD Visit 11/2015   Precautions   Precautions Cervical   Precaution Comments no joint mobilization   Balance Screen   Has the patient fallen in the past 6 months No   Has the patient had a decrease in activity level because of a fear of falling?  No   Is the patient reluctant to leave their home because of a fear of falling?  No   Prior Function   Level of Independence Independent   Cognition   Overall Cognitive Status Within Functional Limits for tasks assessed   Observation/Other Assessments   Focus on Therapeutic Outcomes (FOTO)  43% limitation   AROM   Overall AROM Comments cervical sidebend bil. decreased by 25% and bil. rotation decreased   by 50%   Strength   Overall Strength Comments bil. shoulder strength is 5/5                     [facility] Adult PT Treatment/Exercise - 10/25/15 0001    Shoulder Exercises: Seated   Horizontal ABduction Strengthening;Both;10 reps;Theraband   Theraband Level (Shoulder Horizontal ABduction) Level 3 (Green)   Horizontal ABduction Limitations verbal cues to relax cervical muscles   Other Seated Exercises PNF diagonal with green band 10x each with cervical rotation    Manual Therapy   Manual Therapy Soft tissue mobilization;Myofascial release;Scapular mobilization;Joint mobilization   Manual therapy comments nural tension stretch to bil. UE   Joint Mobilization T3-T4 gapping of right facet   Soft tissue mobilization to interscapular area, rhomiboids, rotator cuff, trapezius, supscapularis   Myofascial Release  bil. rhomboid, levator scapulae, triceps, axilla   Scapular Mobilization bil. scapula in sidely lifiting up the medial border                PT Education - 10/25/15 0910    Education provided Yes   Education Details self soft tissue work with ball to rhomboids and interscapular area with massage ball in standing, interscapular stretches   Person(s) Educated Patient   Methods Explanation;Demonstration   Comprehension Verbalized understanding;Returned demonstration          PT Short Term Goals - 10/25/15 0812    PT SHORT TERM GOAL #1   Title be independent in initial HEP   Time 4   Period Weeks   Status Achieved   PT SHORT TERM GOAL #2   Title report a 25% reduction in neck/Rt UE pain with ADLs and work tasks   Time 4   Period Weeks   Status Achieved   PT SHORT TERM GOAL #3   Title report postural corrections with work tasks at least 50% of the time   Time 4   Period Weeks   Status Achieved           PT Long Term Goals - 10/25/15 0812    PT LONG TERM GOAL #1   Title be independent in advanced HEP   Time 8   Period Weeks   Status Achieved   PT LONG TERM GOAL #2   Title reduce FOTO to < or = to 45% limitation   Time 8   Period Weeks   Status Achieved  43% limitation   PT LONG TERM GOAL #3   Title report a 50% reduction in neck and thoracic pain with ADLs and work tasks   Time 8   Period Weeks   Status Achieved  80% better               Plan - 10/25/15 0911    Clinical Impression Statement Patient has improved cervical ROM, full bil. shoulder AROM, no pain just numbness on right shoulder.  Patient had increased tightness in bil. cervical and shoulder girdle, after therapy improved by 90%.  Patient has met all of her goals and independent with her HEP.    Pt will benefit from skilled therapeutic intervention in order to improve on the following deficits Decreased range of motion;Increased muscle spasms;Decreased activity tolerance;Postural  dysfunction;Impaired flexibility   Rehab Potential Good   PT Treatment/Interventions ADLs/Self Care Home Management;Cryotherapy;Electrical Stimulation;Moist Heat;Therapeutic exercise;Therapeutic activities;Functional mobility training;Ultrasound;Neuromuscular re-education;Patient/family education;Manual techniques;Passive range of motion   PT Next Visit Plan Discharge to HEP   PT Home Exercise Plan current HEP   Consulted and   Agree with Plan of Care Patient        Problem List Patient Active Problem List   Diagnosis Date Noted  . Complex sleep apnea syndrome 04/26/2015  . Hypoventilation associated with obesity syndrome ([place]) 04/26/2015  . OSA (obstructive sleep apnea) 02/05/2015  . Obesity hypoventilation syndrome ([place]) 12/25/2014  . Depression (emotion) 12/25/2014  . Hypersomnia with sleep apnea 12/25/2014  . Chronic tension-type headache, intractable 12/25/2014  . Arnold-Chiari malformation, type II ([place]) 12/25/2014  . Dysphagia 01/10/2013  . IBS (irritable bowel syndrome) 01/10/2013  . Chiari malformation 12/08/2012  . GERD (gastroesophageal reflux disease) 02/10/2012  . Allergic rhinitis due to pollen 02/10/2012  . Insomnia 02/10/2012  . B12 deficiency 02/10/2012  . BMI 40.0-44.9, adult ([place]) 02/10/2012  . Pseudomeningocele, acquired 06/11/2011  . OBESITY, UNSPECIFIED 02/10/2008  . DEPRESSION 02/10/2008  . CONSTIPATION 02/10/2008  . IRRITABLE BOWEL SYNDROME 02/10/2008  . ENDOMETRIOSIS 02/10/2008    GRAY,CHERYL,PT 10/25/2015, 9:14 AM  [facility] [address], [place], [place], [address] Phone: [phone]   Fax:  [phone]  Name: Erin Jimenez MRN: [id number] Date of Birth: 12/04/1971   PHYSICAL THERAPY DISCHARGE SUMMARY  Visits from Start of Care: 13 Current functional level related to goals / functional outcomes: See above   Remaining deficits: See above   Education / Equipment: HEP   Plan: Patient agrees to discharge.  Patient goals were met. Patient is being discharged due to meeting the stated rehab goals. Thank you for the referral. Earlie Counts, PT 10/25/2015 9:15 AM   ?????

## 2015-11-08 ENCOUNTER — Other Ambulatory Visit: Payer: Self-pay | Admitting: Internal Medicine

## 2015-11-11 ENCOUNTER — Encounter: Payer: Self-pay | Admitting: Internal Medicine

## 2015-11-19 ENCOUNTER — Other Ambulatory Visit: Payer: Self-pay | Admitting: Physician Assistant

## 2015-11-26 ENCOUNTER — Telehealth: Payer: Self-pay | Admitting: *Deleted

## 2015-11-26 NOTE — Telephone Encounter (Signed)
Rx faxed

## 2015-11-26 NOTE — Telephone Encounter (Signed)
Faxed prescription

## 2015-12-02 ENCOUNTER — Other Ambulatory Visit: Payer: Self-pay | Admitting: Internal Medicine

## 2015-12-14 ENCOUNTER — Other Ambulatory Visit: Payer: Self-pay | Admitting: Internal Medicine

## 2016-01-16 ENCOUNTER — Other Ambulatory Visit: Payer: Self-pay | Admitting: Internal Medicine

## 2016-03-14 ENCOUNTER — Other Ambulatory Visit: Payer: Self-pay | Admitting: Internal Medicine

## 2016-03-31 ENCOUNTER — Other Ambulatory Visit: Payer: Self-pay | Admitting: Internal Medicine

## 2016-04-23 ENCOUNTER — Encounter: Payer: Self-pay | Admitting: Neurology

## 2016-04-23 ENCOUNTER — Ambulatory Visit (INDEPENDENT_AMBULATORY_CARE_PROVIDER_SITE_OTHER): Payer: 59 | Admitting: Neurology

## 2016-04-23 VITALS — BP 144/80 | HR 78 | Resp 20 | Ht 67.0 in | Wt 304.0 lb

## 2016-04-23 DIAGNOSIS — G4737 Central sleep apnea in conditions classified elsewhere: Secondary | ICD-10-CM

## 2016-04-23 DIAGNOSIS — E662 Morbid (severe) obesity with alveolar hypoventilation: Secondary | ICD-10-CM

## 2016-04-23 DIAGNOSIS — F329 Major depressive disorder, single episode, unspecified: Secondary | ICD-10-CM

## 2016-04-23 DIAGNOSIS — G4731 Primary central sleep apnea: Secondary | ICD-10-CM

## 2016-04-23 DIAGNOSIS — F32A Depression, unspecified: Secondary | ICD-10-CM

## 2016-04-23 DIAGNOSIS — G4733 Obstructive sleep apnea (adult) (pediatric): Secondary | ICD-10-CM | POA: Diagnosis not present

## 2016-04-23 NOTE — Progress Notes (Signed)
Chief Complaint  Patient presents with  . Follow-up    cpap going well, using [facility], rm 10, alone    [facility]   Provider:  Larey Seat, M D  Referring Provider: Leandrew Koyanagi, MD Primary Care Physician:  Leandrew Koyanagi, MD  Chief Complaint  Patient presents with  . Follow-up    cpap going well, using [facility], rm 10, alone    HPI:  Erin Jimenez is a 45 y.o. female  Is seen here as a referral  from Dr. Sherwood Gambler for a sleep apnea re- evaluation.  Her PCP is  Laney Pastor , and her primary Neurologist was Dr. Jannifer Franklin.   Erin Jimenez had been seen Dr. 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  In addition Dr. Sherwood Gambler noted advanced spondylosis and degenerative disc disease at the levels C3 and C4 as well as C4 and C5 - this was resulting in a spinal canal stenosis.  She underwent a suboccipital craniectomy upper cervical laminectomy and cranial cervical duraplasty.  In addition,  to a C3 to C5 posterior cervical arthrodesis with lateral screws and rods and a bone graft. Prior to surgery she was found to have pronounced syringomyelia and follow-up MRIs have shown substantial decompression of this finding with mild residual dilatation of the central canal down to the T1 level. She has had chronic neck pain and headaches. Dr. Sherwood Gambler and her primary care physician Dr.  Tami Lin are concerned about the possibility of her having sleep apnea.  She has had sleep disturbances and has relied on sleep aids for while has actually chronically used Ambien. In addition Dr. Mariea Clonts has prescribed Narco for pain which could at least in theory decrease the patient's breathing rhythm to some degree as a narcotic pain medication.  Some patients will develop a complex apnea syndrome of central and obstructive apnea.  The patient reports she goes to bed between 10 and 11 at night and  usually will need to 1 AM or even longer to fall asleep. The last couple of nights have been good. This is in spite of taking 10 mg of Ambien and being on Flexeril and on Cordarone. Once she gets to sleep she reports that she could continue to sleep. She has set her alarm at 6 AM and relies on the alarm to wake her up. Usually she gets about 5 hours of nocturnal sleep. In the morning she does not feel restored or refreshed, she's excessively stiff at the neck level and has shoulder pain. She is able to sleep in supine as well as in lateral position she reports, but she relies on 2 pillows to prop her up at night. The pillows are required due to her neck pain she feels not to do any respiratory difficulties. She will have one or 2 bathroom breaks at night, and her family has reported that she snores very loud. she wakes up with a very dry mouth , too. They witnessed apneas.  She drinks 2 cups of coffee in AM and 1 small soda in PM.  She works form 8 AM to 5 PM, no shift work history, she works mostly indoors. She has daylight exposure. Due to her extensive surgical history and the pain syndrome, she craves to nap, but cannot fall asleep in daytime.   The patient is obese , she had a lab band in 2005 and in 2014  Developed swallowing difficulties at weight 240 pounds .  Her doctor removed some of the saline from the cuff and she had weight gain after surgery , back to 300. The BMI is posing a high risk for OSA and hypoventilation in conjunction with the narcotics.    Interval history from 04-26-15 Erin Jimenez was evaluated in a split-night polysomnography as ordered in her last visit with me. The patient was diagnosed with obstructive sleep apnea or complex apnea at an AHI of 34.3 and an RDI of 42.9. REM sleep was not seen CO2 was retained which would allow for the diagnosis of hypoventilation syndrome. There were few periodic limb movements there was no cardiac irregularity seen. Patient was titrated to 13 cm  water but did best at 10 cm water pressure. Her oxygen nadir rose to 87% was only 1.6 minutes of desaturation and to my surprise a full face mask and a nasal mask were used by the technologist attending her study.  I dvised to try to change to a nasal pillow. She states that the pillow works very well for her. The patient used to machine 100% of days and 67% of the time at over 4 hours. Her average daily usage is 5 hours 11 minutes, the minimum pressure of 5 cm and maximum pressure of 15 cm water she is an EPR of 3. The 95th percentile pressure is 12.7 cm water. I feel that we should leave her on an auto titrate her and not set the machine to 1 specific pressure as this allows her also to have the necessary adjustments while  she is trying to lose weight.She reports still having difficulties to fall asleep.   Interval history from 04/23/2016, I'm seeing Erin Jimenez today for her yearly CPAP compliance visit. As she had last year reported to have difficulties with initiating and staying asleep she has in the meantime improved significantly, Pristiq is the medication that has helped her to regain better sleep quality. Her CPAP use is compliant at 93% of days , but only 63% over 4 hours of consecutive  . She has an average user time of 4 hours 46 minutes, she is using an AutoSet between 5 and 15 cm water, with an EPR level of 3 cm. The pressure used at the 95th percentile is 12.4. This indicates that the patient has still some apneas left and that she is touching the upper window of the pressure allowance. The residual AHI was 12.3 this 4.4 centrals and 7.1 obstructive apneas. Her baseline AHI had been 34.3, RDI 42.9. So this is a reduction by 60% but I would like her AHI to be undertaken even under 5.  I will refill her Pristiq today, I will also increase the pressure window to 17 cm water.     Review of Systems: Out of a complete 14 system review, the patient complains of only the following symptoms, and  all other reviewed systems are negative. Headaches have improved but not  numbness,  difficulties with swallowing, occasional tremor,  Improved insomnia,  Improved ( controlled ) loud snoring, constipation, nasal allergies, ringing in her ears and  fatigue.  I reviewed the patient's medication list, with the fatigue severity score at  34 from 43 points and the Epworth sleepiness score remained  at 10 points.     Social History   Social History  . Marital Status: Married    Spouse Name: N/A  . Number of Children: N/A  . Years of Education: N/A  Occupational History  . sales Museum/gallery conservator   Social History Main Topics  . Smoking status: Never Smoker   . Smokeless tobacco: Never Used  . Alcohol Use: Yes     Comment: occ  . Drug Use: No  . Sexual Activity: Not Currently   Other Topics Concern  . Not on file   Social History Narrative   Married   Sales associate [employer]   Caffeine 2 cups coffee daily   HS Grad.            Family History  Problem Relation Age of Onset  . Colon polyps Mother   . Diabetes Mother   . Lymphoma Mother     CNS  . Colon polyps Father   . Lymphoma Father   . Cancer Father   . Colon cancer Neg Hx   . Rectal cancer Neg Hx   . Stomach cancer Neg Hx     Past Medical History  Diagnosis Date  . Irritable bowel syndrome   . Chronic headache   . Chiari malformation   . GERD (gastroesophageal reflux disease)   . Endometriosis   . Obesity   . Depression   . B12 deficiency   . Insomnia   . Pseudomeningocele, acquired     Past Surgical History  Procedure Laterality Date  . Abdominal hysterectomy  2001  . Laparoscopic gastric banding  2006  . Spinal fusion    . Cholecystectomy    . Brain surgery      Chiari malformation  . Ercp  2000    Current Outpatient Prescriptions  Medication Sig Dispense Refill  . cholecalciferol (VITAMIN D) 1000 UNITS tablet Take 2,000 Units by mouth daily.    . cyanocobalamin (,VITAMIN B-12,) 1000  MCG/ML injection Inject 1 mL (1,000 mcg total) into the muscle every 30 (thirty) days. 25 mL 0  . cyclobenzaprine (FLEXERIL) 10 MG tablet Take 10 mg by mouth at bedtime.    . dicyclomine (BENTYL) 10 MG capsule Take 1 capsule (10 mg total) by mouth 3 (three) times daily as needed (For cramping ). 30 capsule 2  . Est Estrogens-Methyltest (ESTRATEST PO) Take by mouth. Takes 1.5 tab daily.    Marland Kitchen HYDROCODONE-ACETAMINOPHEN PO Take by mouth. 5-325MG  TABS, TAKES 1.5 TAB QHS    . LINZESS 145 MCG CAPS capsule TAKE ONE CAPSULE BY MOUTH EVERY DAY (NEED APPT FOR MORE REFILLS!!) 30 capsule 0  . Loratadine (CLARITIN PO) Take 20 tablets by mouth daily.     . naproxen sodium (ANAPROX) 220 MG tablet Take 220 mg by mouth. Takes 4-5 tabs daily.    . pantoprazole (PROTONIX) 40 MG tablet TAKE 1 TABLET BY MOUTH EVERY DAY BEFORE BREAKFAST 90 tablet 0  . PRISTIQ 50 MG 24 hr tablet TAKE 1 TABLET BY MOUTH EVERY DAY 90 tablet 2  . zolpidem (AMBIEN) 10 MG tablet TAKE 1 TABLET BY MOUTH AT BEDTIME AS NEEDED FOR SLEEP 90 tablet 0  . fluticasone (FLONASE) 50 MCG/ACT nasal spray Place 2 sprays into the nose daily. Taking as needed     No current facility-administered medications for this visit.    Allergies as of 04/23/2016 - Review Complete 04/23/2016  Allergen Reaction Noted  . Ciprofloxacin Nausea Only   . Doxycycline Nausea Only   . Oxycodone Nausea Only   . Sulfa antibiotics Nausea Only     Vitals: BP 144/80 mmHg  Pulse 78  Resp 20  Ht 5\' 7"  (1.702 m)  Wt 304 lb (137.893 kg)  BMI 47.60 kg/m2 Last Weight:  Wt Readings from Last 1 Encounters:  04/23/16 304 lb (137.893 kg)       Last Height:   Ht Readings from Last 1 Encounters:  04/23/16 5\' 7"  (1.702 m)    Physical exam:  General: The patient is awake, alert and appears not in acute distress. The patient is well groomed. Head: Normocephalic, atraumatic. Neck is supple. Mallampati 3 , small mouth , crowded dental status, bruxism marks,   neck  circumference:19 inches . Nasal airflow : slight restriction , TMJ is evident . Retrognathia is not seen.  Cardiovascular:  Regular rate and rhythm , without  murmurs or carotid bruit, and without distended neck veins. Respiratory: Lungs are clear to auscultation. Skin:  Without evidence of edema, or rash Trunk: BMI is elevated , the patient has a webbed neck, lack of a neck, really.    Neurologic exam : The patient is awake and alert, oriented to place and time.   Memory subjective described as intact. There is a normal attention span & concentration ability.  Speech is fluent without dysarthria, dysphonia or aphasia. Mood and affect are appropriate.  Cranial nerves: Pupils are equal and briskly reactive to light. Funduscopic exam without  evidence of pallor or edema. Right eye abductor weakness.  Extraocular movements  in vertical and horizontal planes are with  nystagmus. Visual fields by finger perimetry are intact. Hearing to finger rub intact. Facial sensation intact to fine touch. Facial motor strength: right , very mild facial droop. Tongue and uvula move midline.   Assessment:  After physical and neurologic examination, review of laboratory studies, imaging, neurophysiology testing and pre-existing records, assessment is   1) Related  to the patients extensive surgical history , her neck pain and spasm, the stiffness and discomfort are expected. She was severely depressed in her 2016 visit, thisha dramatically changed. She is more relaxed and yet more energetic.    2) CSA- the patient was diagnosed with Co2 retention,  central and obstructive apnea. Auto set window increased 5 to 17 cm water.   3) Morbid obesity. BMI, causing her to retain Co2, this in return causes headaches and fatigue , rather than sleepiness.  She is interested in a gastric sleeve procedure. Referral to bariatric surgery with her sleep study supporting the need.     The patient was advised of the nature of  the diagnosed sleep disorder , the treatment options and risks for general a health and wellness arising from not treating the condition.  Visit duration was 30 minutes. More than 50% of my time in face to face consultation were spent with informing the patient of her diagnosis, the risk factors to manage.  Plan:  Treatment plan and additional workup : reduce ambien to half tab.  Continue auto-titration, 5-17 cm water with a nasal pillow, airtfit P10 Weight loss - she wants to learn about  a gastric sleeve.  Pristiq for depression refilled, works well for her.  Dr. Sherwood Gambler follows her Donovan Kail.    Asencion Partridge Alona Danford MD  04/23/2016

## 2016-04-27 ENCOUNTER — Other Ambulatory Visit: Payer: Self-pay | Admitting: Internal Medicine

## 2016-04-28 ENCOUNTER — Ambulatory Visit (INDEPENDENT_AMBULATORY_CARE_PROVIDER_SITE_OTHER): Payer: 59 | Admitting: Internal Medicine

## 2016-04-28 ENCOUNTER — Encounter: Payer: Self-pay | Admitting: Internal Medicine

## 2016-04-28 VITALS — BP 132/70 | HR 72 | Ht 67.0 in | Wt 308.0 lb

## 2016-04-28 DIAGNOSIS — K219 Gastro-esophageal reflux disease without esophagitis: Secondary | ICD-10-CM

## 2016-04-28 DIAGNOSIS — K5900 Constipation, unspecified: Secondary | ICD-10-CM | POA: Diagnosis not present

## 2016-04-28 DIAGNOSIS — Z6841 Body Mass Index (BMI) 40.0 and over, adult: Secondary | ICD-10-CM | POA: Diagnosis not present

## 2016-04-28 DIAGNOSIS — K5909 Other constipation: Secondary | ICD-10-CM

## 2016-04-28 MED ORDER — DICYCLOMINE HCL 10 MG PO CAPS: 10.0000 mg | ORAL_CAPSULE | Freq: Three times a day (TID) | ORAL | Status: DC | PRN

## 2016-04-28 MED ORDER — PANTOPRAZOLE SODIUM 40 MG PO TBEC: DELAYED_RELEASE_TABLET | ORAL | Status: DC

## 2016-04-28 NOTE — Progress Notes (Signed)
   Subjective:    Patient ID: Erin Jimenez, female    DOB: 06/05/1971, 45 y.o.   MRN: [id number] Chiefcomplaint: Follow-up reflux and constipation HPI the patient is here for follow-up, she is satisfied with how she feels on Linzess and pantoprazoleonic constipation symptoms are controlled. She would like refills. She is aware she is obese and is pursuing bariatric surgery  Medications, allergies, past medical history, past surgical history, family history and social history are reviewed and updated in the EMR.   Review of Systems As above    Objective:   Physical Exam  BP 132/70 mmHg  Pulse 72  Ht 5\' 7"  (1.702 m)  Wt 308 lb (139.708 kg)  BMI 48.23 kg/m2  No acute distress      Assessment & Plan:   1. Gastroesophageal reflux disease without esophagitis   2. Chronic constipation   3. Morbid obesity with BMI of 45.0-49.9, adult ([place])    I have refilled her medications as above. She will see me as needed and every 1-2 years.  15 minutes time spent with patient > half in counseling coordination of care

## 2016-04-28 NOTE — Patient Instructions (Signed)
   We have sent the following medications to your pharmacy for you to pick up at your convenience: Pantoprazole , Generic Bentyl, Linzess    Good luck with your bariatric surgery.    I appreciate the opportunity to care for you. Silvano Rusk, MD, [facility]

## 2016-05-04 ENCOUNTER — Encounter: Payer: Self-pay | Admitting: Internal Medicine

## 2016-05-27 ENCOUNTER — Other Ambulatory Visit: Payer: Self-pay | Admitting: Neurology

## 2016-06-10 ENCOUNTER — Other Ambulatory Visit: Payer: Self-pay | Admitting: Neurology

## 2016-07-27 ENCOUNTER — Ambulatory Visit: Payer: 59 | Admitting: Neurology

## 2016-08-11 ENCOUNTER — Ambulatory Visit: Payer: 59 | Admitting: Neurology

## 2016-09-23 ENCOUNTER — Encounter: Payer: Self-pay | Admitting: Neurology

## 2016-09-23 ENCOUNTER — Ambulatory Visit (INDEPENDENT_AMBULATORY_CARE_PROVIDER_SITE_OTHER): Payer: 59 | Admitting: Neurology

## 2016-09-23 VITALS — BP 130/82 | HR 80 | Resp 20 | Ht 66.0 in | Wt 314.0 lb

## 2016-09-23 DIAGNOSIS — E662 Morbid (severe) obesity with alveolar hypoventilation: Secondary | ICD-10-CM

## 2016-09-23 DIAGNOSIS — Z9989 Dependence on other enabling machines and devices: Secondary | ICD-10-CM | POA: Diagnosis not present

## 2016-09-23 DIAGNOSIS — Q019 Encephalocele, unspecified: Secondary | ICD-10-CM

## 2016-09-23 DIAGNOSIS — M5481 Occipital neuralgia: Secondary | ICD-10-CM

## 2016-09-23 DIAGNOSIS — G4733 Obstructive sleep apnea (adult) (pediatric): Secondary | ICD-10-CM

## 2016-09-23 MED ORDER — ZOLPIDEM TARTRATE 10 MG PO TABS: 10.0000 mg | ORAL_TABLET | Freq: Every evening | ORAL | 3 refills | Status: DC | PRN

## 2016-09-23 MED ORDER — DESVENLAFAXINE SUCCINATE ER 50 MG PO TB24: 50.0000 mg | ORAL_TABLET | Freq: Every day | ORAL | 2 refills | Status: DC

## 2016-09-23 NOTE — Progress Notes (Signed)
Chief Complaint  Patient presents with  . Follow-up    cpap going well    [facility]   Provider:  Larey Seat, M D  Referring Provider: Leandrew Koyanagi, MD Primary Care Physician:  Erin Koyanagi, MD  Chief Complaint  Patient presents with  . Follow-up    cpap going well    HPI:  Erin Jimenez is a 45 y.o. female  Is seen here as a referral  from Erin Jimenez for a sleep apnea re- evaluation.  Her PCP is  Erin Jimenez , and her primary Neurologist was Erin Jimenez.   Erin Jimenez had been seen Erin Jimenez in 2012-2013 ,  who diagnosed her with Arnold-Chiari malformation and  a congenital fusion of the second and third cervical vertebra.  In addition Erin Jimenez noted advanced spondylosis and degenerative disc disease at the levels C3 and C4 as well as C4 and C5 - this was resulting in a spinal canal stenosis.  She underwent a suboccipital craniectomy upper cervical laminectomy and cranial cervical duraplasty.  In addition,  to a C3 to C5 posterior cervical arthrodesis with lateral screws and rods and a bone graft. Prior to surgery she was found to have pronounced syringomyelia and follow-up MRIs have shown substantial decompression of this finding with mild residual dilatation of the central canal down to the T1 level. She has had chronic neck pain and headaches. Erin Jimenez and her primary care physician Dr.  Tami Jimenez are concerned about the possibility of her having sleep apnea.  She has had sleep disturbances and has relied on sleep aids for while has actually chronically used Ambien. In addition Dr. Mariea Jimenez has prescribed Narco for pain which could at least in theory decrease the patient's breathing rhythm to some degree as a narcotic pain medication.  Some patients will develop a complex apnea syndrome of central and obstructive apnea.  The patient reports she goes to bed between 10 and 11 at night and usually will need to 1 AM or even longer to fall  asleep. The last couple of nights have been good. This is in spite of taking 10 mg of Ambien and being on Flexeril and on Cordarone. Once she gets to sleep she reports that she could continue to sleep. She has set her alarm at 6 AM and relies on the alarm to wake her up. Usually she gets about 5 hours of nocturnal sleep. In the morning she does not feel restored or refreshed, she's excessively stiff at the neck level and has shoulder pain. She is able to sleep in supine as well as in lateral position she reports, but she relies on 2 pillows to prop her up at night. The pillows are required due to her neck pain she feels not to do any respiratory difficulties. She will have one or 2 bathroom breaks at night, and her family has reported that she snores very loud. she wakes up with a very dry mouth , too. They witnessed apneas.  She drinks 2 cups of coffee in AM and 1 small soda in PM. She works form 8 AM to 5 PM, no shift work history, she works mostly indoors. She has daylight exposure. Due to her extensive surgical history and the pain syndrome, she craves to nap, but cannot fall asleep in daytime. The patient is obese , she had a lab band in 2005 and in 2014  Developed swallowing difficulties at weight 240 pounds .  Her doctor removed  some of the saline from the cuff and she had weight gain after surgery , back to 300. The BMI is posing a high risk for OSA and hypoventilation in conjunction with the narcotics.    Interval history from 04-26-15 Erin Jimenez was evaluated in a split-night polysomnography as ordered in her last visit with me. The patient was diagnosed with obstructive sleep apnea or complex apnea at an AHI of 34.3 and an RDI of 42.9. REM sleep was not seen CO2 was retained which would allow for the diagnosis of hypoventilation syndrome. There were few periodic limb movements there was no cardiac irregularity seen. Patient was titrated to 13 cm water but did best at 10 cm water pressure. Her oxygen  nadir rose to 87% was only 1.6 minutes of desaturation and to my surprise a full face mask and a nasal mask were used by the technologist attending her study.  I dvised to try to change to a nasal pillow. She states that the pillow works very well for her. The patient used to machine 100% of days and 67% of the time at over 4 hours. Her average daily usage is 5 hours 11 minutes, the minimum pressure of 5 cm and maximum pressure of 15 cm water she is an EPR of 3. The 95th percentile pressure is 12.7 cm water. I feel that we should leave her on an auto titrate her and not set the machine to 1 specific pressure as this allows her also to have the necessary adjustments while  she is trying to lose weight.She reports still having difficulties to fall asleep.   Interval history from 04/23/2016, I'm seeing Erin Jimenez today for her yearly CPAP compliance visit. As she had last year reported to have difficulties with initiating and staying asleep she has in the meantime improved significantly, Pristiq is the medication that has helped her to regain better sleep quality. Her CPAP use is compliant at 93% of days , but only 63% over 4 hours of consecutive  . She has an average user time of 4 hours 46 minutes, she is using an AutoSet between 5 and 15 cm water, with an EPR level of 3 cm. The pressure used at the 95th percentile is 12.4. This indicates that the patient has still some apneas left and that she is touching the upper window of the pressure allowance.  The residual AHI was 12.3 this 4.4 centrals and 7.1 obstructive apneas. Her baseline AHI had been 34.3, RDI 42.9. So this is a reduction by 60% but I would like her AHI to be under 5. I will also increase the pressure window to 17 cm water.  Interval history from 09/23/2016, as the pleasure of seeing Erin Jimenez today with a compliance download. She has used her CPAP machine all of the last 30 days and 25 days over 4 hours with an 83% compliance. Average user time  is 5 hours and 18 minutes she's using an AutoSet between 5 and 17 cm water pressure, full-time EPR of 3 cm water, residual apnea index is 3.6. Her 95th percentile pressure is 14 cm water and therefore covered by the current settings. She does have moderate air leakage on one or 2 days a week. She feels good and confident in her current settings, sleeps well she still has a fatigue score 54 points at her sleepiness score is endorsed at only 7 points. Depression is improved on Pristiq . Occipital Nerve block by Erin Jimenez for Chiari related pain.  Review of Systems: Out of a complete 14 system review, the patient complains of only the following symptoms, and all other reviewed systems are negative. Headaches have improved but not  numbness,  difficulties with swallowing, occasional tremor,  Improved insomnia,  Improved ( controlled ) loud snoring, constipation, nasal allergies, ringing in her ears and  fatigue.  I reviewed the patient's medication list, with the fatigue severity score at  54 from 43 points and the Epworth sleepiness score reduced to 7 points.      Social History   Social History  . Marital status: Married    Spouse name: N/A  . Number of children: N/A  . Years of education: N/A   Occupational History  . sales Museum/gallery conservator   Social History Main Topics  . Smoking status: Never Smoker  . Smokeless tobacco: Never Used  . Alcohol use Yes     Comment: occ  . Drug use: No  . Sexual activity: Not Currently   Other Topics Concern  . Not on file   Social History Narrative   Married   Sales associate [employer]   Caffeine 2 cups coffee daily   HS Grad.            Family History  Problem Relation Age of Onset  . Colon polyps Mother   . Diabetes Mother   . Lymphoma Mother     CNS  . Colon polyps Father   . Lymphoma Father   . Cancer Father   . Colon cancer Neg Hx   . Rectal cancer Neg Hx   . Stomach cancer Neg Hx     Past Medical History:    Diagnosis Date  . B12 deficiency   . Chiari malformation   . Chronic headache   . Depression   . Endometriosis   . GERD (gastroesophageal reflux disease)   . Insomnia   . Irritable bowel syndrome   . Obesity   . Pseudomeningocele, acquired     Past Surgical History:  Procedure Laterality Date  . ABDOMINAL HYSTERECTOMY  2001  . BRAIN SURGERY     Chiari malformation  . CHOLECYSTECTOMY    . ERCP  2000  . LAPAROSCOPIC GASTRIC BANDING  2006  . SPINAL FUSION      Current Outpatient Prescriptions  Medication Sig Dispense Refill  . cholecalciferol (VITAMIN D) 1000 UNITS tablet Take 2,000 Units by mouth daily.    . cyanocobalamin (,VITAMIN B-12,) 1000 MCG/ML injection Inject 1 mL (1,000 mcg total) into the muscle every 30 (thirty) days. 25 mL 0  . cyclobenzaprine (FLEXERIL) 10 MG tablet Take 10 mg by mouth at bedtime.    Marland Kitchen desvenlafaxine (PRISTIQ) 50 MG 24 hr tablet TAKE 1 TABLET BY MOUTH EVERY DAY 90 tablet 2  . dicyclomine (BENTYL) 10 MG capsule Take 1 capsule (10 mg total) by mouth 3 (three) times daily as needed (For cramping ). 30 capsule 4  . Est Estrogens-Methyltest (ESTRATEST PO) Take by mouth. Takes 1.5 tab daily.    Marland Kitchen HYDROCODONE-ACETAMINOPHEN PO Take by mouth. 5-325MG  TABS, TAKES 1.5 TAB QHS    . linaclotide (LINZESS) 145 MCG CAPS capsule Take 1 capsule (145 mcg total) by mouth daily before breakfast. 90 capsule 3  . Loratadine (CLARITIN PO) Take 20 tablets by mouth daily.     . naproxen sodium (ANAPROX) 220 MG tablet Take 220 mg by mouth. Takes 4-5 tabs daily.    . pantoprazole (PROTONIX) 40 MG tablet TAKE 1 TABLET BY MOUTH EVERY DAY  BEFORE BREAKFAST 90 tablet 3  . zolpidem (AMBIEN) 10 MG tablet TAKE 1 TABLET BY MOUTH AT BEDTIME AS NEEDED FOR SLEEP 90 tablet 0  . fluticasone (FLONASE) 50 MCG/ACT nasal spray Place 2 sprays into the nose daily. Taking as needed     No current facility-administered medications for this visit.     Allergies as of 09/23/2016 - Review  Complete 09/23/2016  Allergen Reaction Noted  . Ciprofloxacin Nausea Only   . Doxycycline Nausea Only   . Oxycodone Nausea Only   . Sulfa antibiotics Nausea Only     Vitals: BP 130/82   Pulse 80   Resp 20   Ht 5\' 6"  (1.676 m)   Wt (!) 314 lb (142.4 kg)   BMI 50.68 kg/m  Last Weight:  Wt Readings from Last 1 Encounters:  09/23/16 (!) 314 lb (142.4 kg)       Last Height:   Ht Readings from Last 1 Encounters:  09/23/16 5\' 6"  (1.676 m)    Physical exam:  General: The patient is awake, alert and appears not in acute distress. The patient is well groomed. Head: Normocephalic, atraumatic. Neck is supple. Mallampati 3 , small mouth , crowded dental status, bruxism marks,   neck circumference:19 inches . Nasal airflow : slight restriction , TMJ is evident . Retrognathia is not seen.  Cardiovascular:  Regular rate and rhythm , without  murmurs or carotid bruit, and without distended neck veins. Respiratory: Lungs are clear to auscultation. Skin:  Without evidence of edema, or rash Trunk: BMI is elevated , the patient has a webbed neck, lack of a neck, really.    Neurologic exam : The patient is awake and alert, oriented to place and time.   Memory subjective described as intact. There is a normal attention span & concentration ability.  Speech is fluent without dysarthria, dysphonia or aphasia. Mood and affect are appropriate.  Cranial nerves: Pupils are equal and briskly reactive to light. Diplopia at times - Right eye abductor weakness. - intracranial pressure followed by dr Erin Jimenez.  Extraocular movements  in vertical and horizontal planes are with  nystagmus. Visual fields by finger perimetry - peripheral visual field reduction/ restriction on the right side.  Hearing to finger rub intact. Facial sensation intact to fine touch. Facial motor strength: right , very mild facial droop. Tongue and uvula move midline.   Assessment:  After physical and neurologic examination,  review of laboratory studies, imaging, neurophysiology testing and pre-existing records, assessment is   1) Related  to the patients extensive surgical history , her neck pain and spasm, the stiffness and discomfort are expected.  She was severely depressed in her 2016 visit, this has dramatically changed. She is more relaxed and yet more energetic. She remains on Prisitiq.     2) CSA- the patient was diagnosed with Co2 retention,  central and obstructive apnea. Auto set window increased 5 to 17 cm water. AHI under 5.   3) Morbid obesity. BMI, causing her to retain Co2, this in return causes headaches and fatigue , rather than sleepiness.  She is interested in a gastric sleeve procedure. Referral to bariatric surgery with her sleep study supporting the need.   4) restricted peripheral vision related to elevated intracranial pressure, related to Arnold-Chiari. This likely also promotes the development of diplopia. She often has headaches and Erin Jimenez gave her an occipital nerve block which has helped. She has yearly visual field examinations with [facility]. Her last 1 was  in March and the next one will be in March next year  The patient was advised of the nature of the diagnosed sleep disorder , the treatment options and risks for general a health and wellness arising from not treating the condition.  Visit duration was 30 minutes. More than 50% of my time in face to face consultation were spent with informing the patient of her diagnosis, the risk factors to manage.  Plan:  Treatment plan and additional workup : Ambien again using full strength  Aleve in daytime  Continue auto-titration, 5-17 cm water with a nasal pillow, airtfit P10 Weight loss - she wants to learn about  a gastric sleeve.  Pristiq for depression refilled, works well for her.  Erin Jimenez follows her Donovan Kail.    Asencion Partridge Cace Osorto MD  09/23/2016

## 2016-10-15 ENCOUNTER — Other Ambulatory Visit: Payer: Self-pay | Admitting: Family Medicine

## 2016-10-15 DIAGNOSIS — Z8349 Family history of other endocrine, nutritional and metabolic diseases: Secondary | ICD-10-CM

## 2016-10-15 MED ORDER — CYANOCOBALAMIN 1000 MCG/ML IJ SOLN: 1000.0000 ug | INTRAMUSCULAR | 0 refills | Status: DC

## 2016-10-15 NOTE — Telephone Encounter (Signed)
Pt is a former Engineer, maintenance (IT) patient she is needing a refill on her B12 she is going out of town tomorrow and would like this sent to [facility] she will call and make an appt for a CPE soon

## 2016-10-15 NOTE — Telephone Encounter (Signed)
Please advise 

## 2016-10-15 NOTE — Telephone Encounter (Signed)
This patient was last seen over 1 year ago and last saw Philis Fendt in 2016. Please forward Rx to him to see if he will refill. She really needs to come in and have labs. But ask Legrand Como.  Carroll Sage. Kenton Kingfisher, MSN, FNP-C [facility]

## 2017-01-06 ENCOUNTER — Telehealth: Payer: Self-pay | Admitting: Internal Medicine

## 2017-01-06 ENCOUNTER — Telehealth: Payer: Self-pay

## 2017-01-06 MED ORDER — ESOMEPRAZOLE MAGNESIUM 40 MG PO CPDR: 40.0000 mg | DELAYED_RELEASE_CAPSULE | Freq: Every day | ORAL | 0 refills | Status: DC

## 2017-01-06 MED ORDER — LANSOPRAZOLE 30 MG PO CPDR: 30.0000 mg | DELAYED_RELEASE_CAPSULE | Freq: Every day | ORAL | 0 refills | Status: DC

## 2017-01-06 NOTE — Telephone Encounter (Signed)
Please advise Sir, thank you. 

## 2017-01-06 NOTE — Telephone Encounter (Signed)
Lansoprazole 30 mg ?

## 2017-01-06 NOTE — Telephone Encounter (Signed)
I sent in the esomeprazole and they are kicking it out Sir.  This was to replace the pantoprazole. Would you like me to do a prior authorization or send in another PPI.  Please advise Sir, thank you.

## 2017-01-06 NOTE — Telephone Encounter (Signed)
I would substitute esomeprazole 40 mg instead

## 2017-01-06 NOTE — Telephone Encounter (Signed)
Patient aware that lansoprazole sent in .  Called [facility] and now that won't run thru either so [facility] is going to call the insurance company and see what we can do and they will call me back.

## 2017-01-06 NOTE — Telephone Encounter (Signed)
Sent in generic Nexium to replace her pantoprazole that her insurance won't cover any more.  Spoke with patient and made her aware, she will call back with any problems.

## 2017-01-07 MED ORDER — ESOMEPRAZOLE MAGNESIUM 40 MG PO CPDR: 40.0000 mg | DELAYED_RELEASE_CAPSULE | Freq: Every day | ORAL | 0 refills | Status: AC

## 2017-01-07 NOTE — Telephone Encounter (Signed)
Pharmacy called and according to the insurance company no PPI covered without a prior authorization.

## 2017-01-07 NOTE — Telephone Encounter (Signed)
I've spoken to [place] at the [facility] at [phone] to try and figure out a PPI for the patient.  She is sending over a form to fill out to try and get the generic Nexium. Patient is aware that we are working on this for her.

## 2017-01-08 NOTE — Telephone Encounter (Signed)
I informed Erin Jimenez what Dr Carlean Purl had advised and she said that is what she had been doing since we have been working on this and so far one Nexium daily is working.  She will call us back if she has any problems.

## 2017-01-08 NOTE — Telephone Encounter (Signed)
Looks like she will need to take 1-2 OTC Nexium daily I can write an appeal also if that is possible

## 2017-01-08 NOTE — Telephone Encounter (Signed)
I've spoken to the insurance company and they don't cover any PPI's.  Would you like to write an appeal letter for the esomeprazole Sir?

## 2017-02-17 ENCOUNTER — Telehealth: Payer: Self-pay | Admitting: Internal Medicine

## 2017-02-17 NOTE — Telephone Encounter (Signed)
Spoke with Erin Jimenez and told her to call her insurance company and see what they will pay for and let us know so we can ask Dr Carlean Purl.

## 2017-03-02 NOTE — Telephone Encounter (Signed)
Left a message to call me back with any information she has from her insurance company.

## 2017-03-24 NOTE — Telephone Encounter (Signed)
Left another message to call me back so we can help her out.

## 2017-03-28 ENCOUNTER — Other Ambulatory Visit: Payer: Self-pay | Admitting: Neurology

## 2017-04-29 ENCOUNTER — Other Ambulatory Visit: Payer: Self-pay | Admitting: Neurology

## 2017-04-29 MED ORDER — ZOLPIDEM TARTRATE 10 MG PO TABS: 10.0000 mg | ORAL_TABLET | Freq: Every evening | ORAL | 1 refills | Status: DC | PRN

## 2017-04-29 NOTE — Telephone Encounter (Signed)
[facility]. Received a receipt of confirmation.  

## 2017-04-29 NOTE — Addendum Note (Signed)
Addended by: Lester Parowan A on: 04/29/2017 02:27 PM   Modules accepted: Orders

## 2017-04-29 NOTE — Telephone Encounter (Signed)
Patient called office requesting refill for zolpidem (AMBIEN) 10 MG tablet pharmacy advised patient to contact office for refill request.  [place]

## 2017-05-06 ENCOUNTER — Other Ambulatory Visit: Payer: Self-pay | Admitting: Internal Medicine

## 2017-09-07 ENCOUNTER — Other Ambulatory Visit: Payer: Self-pay | Admitting: Gastroenterology

## 2017-09-29 ENCOUNTER — Ambulatory Visit (INDEPENDENT_AMBULATORY_CARE_PROVIDER_SITE_OTHER): Payer: 59 | Admitting: Neurology

## 2017-09-29 ENCOUNTER — Encounter: Payer: Self-pay | Admitting: Neurology

## 2017-09-29 DIAGNOSIS — Q019 Encephalocele, unspecified: Secondary | ICD-10-CM | POA: Diagnosis not present

## 2017-09-29 DIAGNOSIS — Z9989 Dependence on other enabling machines and devices: Secondary | ICD-10-CM

## 2017-09-29 DIAGNOSIS — M5481 Occipital neuralgia: Secondary | ICD-10-CM | POA: Diagnosis not present

## 2017-09-29 DIAGNOSIS — G4733 Obstructive sleep apnea (adult) (pediatric): Secondary | ICD-10-CM

## 2017-09-29 NOTE — Patient Instructions (Signed)
I made a referral to Dr. Dennard Nip , she practices at  [facility] on [address].
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
## 2017-11-07 ENCOUNTER — Other Ambulatory Visit: Payer: Self-pay | Admitting: Neurology

## 2017-11-17 ENCOUNTER — Other Ambulatory Visit: Payer: Self-pay | Admitting: Neurology

## 2017-12-07 ENCOUNTER — Other Ambulatory Visit: Payer: Self-pay | Admitting: Internal Medicine

## 2018-01-02 ENCOUNTER — Other Ambulatory Visit: Payer: Self-pay | Admitting: Physician Assistant

## 2018-01-02 DIAGNOSIS — Z8349 Family history of other endocrine, nutritional and metabolic diseases: Secondary | ICD-10-CM

## 2018-02-05 ENCOUNTER — Other Ambulatory Visit: Payer: Self-pay | Admitting: Physician Assistant

## 2018-02-05 DIAGNOSIS — Z8349 Family history of other endocrine, nutritional and metabolic diseases: Secondary | ICD-10-CM

## 2018-02-24 ENCOUNTER — Encounter (INDEPENDENT_AMBULATORY_CARE_PROVIDER_SITE_OTHER): Payer: 59

## 2018-02-28 ENCOUNTER — Ambulatory Visit (INDEPENDENT_AMBULATORY_CARE_PROVIDER_SITE_OTHER): Payer: 59 | Admitting: Family Medicine

## 2018-03-03 ENCOUNTER — Encounter (INDEPENDENT_AMBULATORY_CARE_PROVIDER_SITE_OTHER): Payer: 59

## 2018-03-08 ENCOUNTER — Other Ambulatory Visit: Payer: Self-pay | Admitting: Internal Medicine

## 2018-03-09 ENCOUNTER — Ambulatory Visit (INDEPENDENT_AMBULATORY_CARE_PROVIDER_SITE_OTHER): Payer: 59 | Admitting: Family Medicine

## 2018-03-09 ENCOUNTER — Encounter (INDEPENDENT_AMBULATORY_CARE_PROVIDER_SITE_OTHER): Payer: Self-pay

## 2018-06-01 ENCOUNTER — Other Ambulatory Visit: Payer: Self-pay | Admitting: Neurology

## 2018-06-01 MED ORDER — ZOLPIDEM TARTRATE 10 MG PO TABS: 10.0000 mg | ORAL_TABLET | Freq: Every evening | ORAL | 1 refills | Status: DC | PRN

## 2018-07-20 ENCOUNTER — Telehealth: Payer: Self-pay | Admitting: Internal Medicine

## 2018-07-20 NOTE — Telephone Encounter (Signed)
Patient has not been seen since 2017.  She is asking for an alternative to linzess.  She will contact her insurance company for her coverage. Follow up arranged

## 2018-09-14 ENCOUNTER — Ambulatory Visit: Payer: 59 | Admitting: Internal Medicine

## 2018-11-06 ENCOUNTER — Other Ambulatory Visit: Payer: Self-pay | Admitting: Internal Medicine

## 2018-11-12 ENCOUNTER — Other Ambulatory Visit: Payer: Self-pay | Admitting: Neurology

## 2018-11-18 ENCOUNTER — Other Ambulatory Visit: Payer: Self-pay | Admitting: Internal Medicine

## 2018-11-18 MED ORDER — LINACLOTIDE 145 MCG PO CAPS: 145.0000 ug | ORAL_CAPSULE | Freq: Every day | ORAL | 0 refills | Status: DC

## 2018-11-18 NOTE — Telephone Encounter (Signed)
Pt is requesting rf for lin[facility]ess [facility] [address]. She tried to make an appt but due to constrains with her scheduled she was not available on the days that I offered before the end of the year. She was put on Jessica's and Dr. Celesta Aver cancellation list.

## 2018-11-18 NOTE — Telephone Encounter (Signed)
Prescription sent to pharmacy until scheduled appt.

## 2018-11-21 ENCOUNTER — Telehealth: Payer: Self-pay | Admitting: Neurology

## 2018-11-21 NOTE — Telephone Encounter (Signed)
Pt has scheduled her 1 year f/u and is asking for a refill on her desvenlafaxine (PRISTIQ) 50 MG 24 hr tablet and zolpidem (AMBIEN) 10 MG tablet [id number] - [place], [place] [address]

## 2018-11-22 ENCOUNTER — Other Ambulatory Visit: Payer: Self-pay | Admitting: Neurology

## 2018-11-22 MED ORDER — DESVENLAFAXINE SUCCINATE ER 50 MG PO TB24: 50.0000 mg | ORAL_TABLET | Freq: Every day | ORAL | 0 refills | Status: DC

## 2018-11-22 MED ORDER — ZOLPIDEM TARTRATE 10 MG PO TABS: 10.0000 mg | ORAL_TABLET | Freq: Every evening | ORAL | 0 refills | Status: DC | PRN

## 2018-11-22 NOTE — Telephone Encounter (Signed)
Refills have been placed and sent to the pharmacy for the patient. Will see her in upcoming apt

## 2018-11-25 ENCOUNTER — Ambulatory Visit: Payer: 59 | Admitting: Physician Assistant

## 2018-11-29 ENCOUNTER — Encounter: Payer: Self-pay | Admitting: Neurology

## 2018-12-01 ENCOUNTER — Ambulatory Visit (INDEPENDENT_AMBULATORY_CARE_PROVIDER_SITE_OTHER): Payer: 59 | Admitting: Neurology

## 2018-12-01 ENCOUNTER — Encounter: Payer: Self-pay | Admitting: Neurology

## 2018-12-01 VITALS — BP 146/70 | HR 84 | Ht 67.0 in | Wt 319.0 lb

## 2018-12-01 DIAGNOSIS — F5104 Psychophysiologic insomnia: Secondary | ICD-10-CM

## 2018-12-01 DIAGNOSIS — E662 Morbid (severe) obesity with alveolar hypoventilation: Secondary | ICD-10-CM

## 2018-12-01 DIAGNOSIS — G4739 Other sleep apnea: Secondary | ICD-10-CM

## 2018-12-01 DIAGNOSIS — Z9989 Dependence on other enabling machines and devices: Secondary | ICD-10-CM

## 2018-12-01 DIAGNOSIS — G4733 Obstructive sleep apnea (adult) (pediatric): Secondary | ICD-10-CM

## 2018-12-01 DIAGNOSIS — Q019 Encephalocele, unspecified: Secondary | ICD-10-CM | POA: Diagnosis not present

## 2018-12-01 DIAGNOSIS — G4731 Primary central sleep apnea: Secondary | ICD-10-CM | POA: Diagnosis not present

## 2018-12-01 MED ORDER — ALPRAZOLAM 0.5 MG PO TABS: 0.2500 mg | ORAL_TABLET | Freq: Every evening | ORAL | 0 refills | Status: DC | PRN

## 2018-12-01 MED ORDER — TRAZODONE HCL 50 MG PO TABS: 50.0000 mg | ORAL_TABLET | Freq: Every evening | ORAL | 1 refills | Status: DC | PRN
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
## 2018-12-11 ENCOUNTER — Encounter

## 2018-12-29 ENCOUNTER — Telehealth: Payer: Self-pay | Admitting: Internal Medicine

## 2018-12-29 MED ORDER — LINACLOTIDE 145 MCG PO CAPS: 145.0000 ug | ORAL_CAPSULE | Freq: Every day | ORAL | 0 refills | Status: DC

## 2018-12-29 NOTE — Telephone Encounter (Signed)
Pt is requesting refill for linzess sent to [facility] on [address]. She is sched for appt with Alonza Bogus, PA 1.21.20.

## 2018-12-29 NOTE — Telephone Encounter (Signed)
Pt is requesting refill for Lin[facility] sent to [facility] on [address].  She is sched for appt w Alonza Bogus, PA 1.21.20

## 2018-12-29 NOTE — Telephone Encounter (Signed)
Linzess refilled as requested. 

## 2018-12-29 NOTE — Telephone Encounter (Signed)
This is a Dr Carlean Purl pt please route accordingly.

## 2019-01-03 ENCOUNTER — Encounter: Payer: Self-pay | Admitting: Gastroenterology

## 2019-01-03 ENCOUNTER — Ambulatory Visit (INDEPENDENT_AMBULATORY_CARE_PROVIDER_SITE_OTHER): Payer: 59 | Admitting: Gastroenterology

## 2019-01-03 VITALS — BP 130/70 | HR 82 | Ht 66.0 in | Wt 320.0 lb

## 2019-01-03 DIAGNOSIS — K5909 Other constipation: Secondary | ICD-10-CM | POA: Diagnosis not present

## 2019-01-03 DIAGNOSIS — K581 Irritable bowel syndrome with constipation: Secondary | ICD-10-CM | POA: Diagnosis not present

## 2019-01-03 MED ORDER — LINACLOTIDE 145 MCG PO CAPS: 145.0000 ug | ORAL_CAPSULE | Freq: Every day | ORAL | 3 refills | Status: DC

## 2019-01-03 NOTE — Progress Notes (Addendum)
01/03/2019 Erin Jimenez [id number] [birth year]   HISTORY OF PRESENT ILLNESS: This is a pleasant 48 year old female who is a patient of Dr. Celesta Aver.  She follows here for reflux and chronic constipation/IBS-C.  She takes Linzess 145 mcg daily for her symptoms.  She has not been seen here since 2017 and was requesting refills on the medication so she was advised that she needed an office visit.  She tells me that the [place] works very well for her.  It is very expensive, however, so she is looking into other options including getting it through [place].  She does tend to use it sparingly and does not take it on a daily basis a lot of times, so will just take it a few times per week.  She would like to continue it, however, as she is comfortable with it and knows that it works well without causing her a lot of other side effects.  She had been on amitiza in the past as well.  Her last colonoscopy was in 2014 and was normal at that time so repeat was recommended at a 10-year interval.  She says that overall her health has been well over the past 2 years.   Past Medical History:  Diagnosis Date  . B12 deficiency   . Chiari malformation   . Chronic headache   . Depression   . Endometriosis   . GERD (gastroesophageal reflux disease)   . Insomnia   . Irritable bowel syndrome   . Obesity   . Pseudomeningocele, acquired    Past Surgical History:  Procedure Laterality Date  . ABDOMINAL HYSTERECTOMY  2001  . BRAIN SURGERY     Chiari malformation  . CHOLECYSTECTOMY    . ERCP  2000  . LAPAROSCOPIC GASTRIC BANDING  2006  . SPINAL FUSION      reports that she has never smoked. She has never used smokeless tobacco. She reports current alcohol use. She reports that she does not use drugs. family history includes Cancer in her father; Colon polyps in her father and mother; Diabetes in her mother; Lymphoma in her father and mother. Allergies  Allergen Reactions  . Ciprofloxacin Nausea Only  .  Doxycycline Nausea Only  . Oxycodone Nausea Only  . Sulfa Antibiotics Nausea Only      Outpatient Encounter Medications as of 01/03/2019  Medication Sig  . ALPRAZolam (XANAX) 0.5 MG tablet Take 0.5 tablets (0.25 mg total) by mouth at bedtime as needed for anxiety.  . cholecalciferol (VITAMIN D) 1000 UNITS tablet Take 2,000 Units by mouth daily.  . cyanocobalamin (,VITAMIN B-12,) 1000 MCG/ML injection Inject 1 mL (1,000 mcg total) into the muscle every 30 (thirty) days.  . cyclobenzaprine (FLEXERIL) 10 MG tablet Take 10 mg by mouth at bedtime.  Marland Kitchen desvenlafaxine (PRISTIQ) 50 MG 24 hr tablet Take 1 tablet (50 mg total) by mouth daily.  Marland Kitchen dicyclomine (BENTYL) 10 MG capsule Take 1 capsule (10 mg total) by mouth 3 (three) times daily as needed (For cramping ).  Marland Kitchen esomeprazole (NEXIUM) 40 MG capsule Take 1 capsule (40 mg total) by mouth daily before breakfast.  . Est Estrogens-Methyltest (ESTRATEST PO) Take by mouth. Takes 1.5 tab daily.  Marland Kitchen HYDROCODONE-ACETAMINOPHEN PO Take by mouth. 5-325MG  TABS, TAKES 1.5 TAB QHS  . linaclotide (LINZESS) 145 MCG CAPS capsule Take 1 capsule (145 mcg total) by mouth daily before breakfast.  . Loratadine (CLARITIN PO) Take 20 tablets by mouth daily.   . Magnesium 250 MG TABS  Take by mouth.  . naproxen sodium (ANAPROX) 220 MG tablet Take 220 mg by mouth. Takes 4-5 tabs daily.  . Potassium 75 MG TABS Take 1 tablet by mouth daily.  . traZODone (DESYREL) 50 MG tablet Take 1 tablet (50 mg total) by mouth at bedtime as needed for sleep.  . fluticasone (FLONASE) 50 MCG/ACT nasal spray Place 2 sprays into the nose daily. Taking as needed  . [DISCONTINUED] zolpidem (AMBIEN) 10 MG tablet Take 1 tablet (10 mg total) by mouth at bedtime as needed. for sleep   No facility-administered encounter medications on file as of 01/03/2019.      REVIEW OF SYSTEMS  : All other systems reviewed and negative except where noted in the History of Present Illness.   PHYSICAL EXAM: BP  130/70   Pulse 82   Ht 5\' 6"  (1.676 m)   Wt (!) 320 lb (145.2 kg)   BMI 51.65 kg/m  General: Well developed white female in no acute distress Head: Normocephalic and atraumatic Eyes:  Sclerae anicteric, conjunctiva pink. Ears: Normal auditory acuity Lungs: Clear throughout to auscultation; no increased WOB. Heart: Regular rate and rhythm; no M/R/G. Abdomen: Soft, nontender, non distended. No masses or hepatomegaly noted. Normal bowel sounds Musculoskeletal: Symmetrical with no gross deformities  Skin: No lesions on visible extremities Extremities: No edema  Neurological: Alert oriented x 4, grossly non-focal Psychological:  Alert and cooperative. Normal mood and affect  ASSESSMENT AND PLAN: *Chronic constipation/IBS-C:  Does very well on Linzess 145 mcg daily.  Is here for prescription refills as she has not been here in a couple of years.  Says that it is very expensive so she is looking to get it out of [place] and also tries to use it sparingly.  Will refill prescription and give samples/coupon card.  Will see her back every 1-2 years for refills.   Agree with [name] management.  Gatha Mayer, MD, [facility]

## 2019-01-03 NOTE — Patient Instructions (Signed)
We have given you a prescription of the following medications to your pharmacy for you to pick up at your convenience:  Linzess 145 mcg  You have been given some samples of Linzess 72 mcg( we are currently out of the 145 dose).  Take two of these 30 minutes before your first meal of the day.

## 2019-01-24 ENCOUNTER — Telehealth: Payer: Self-pay | Admitting: Neurology

## 2019-01-24 NOTE — Telephone Encounter (Signed)
Pt is calling to inform that she is experiencing a breakout and horribly itching.  Pt feels it because of traZODone (DESYREL) 50 MG tablet or her ALPRAZolam (XANAX) 0.5 MG tablet Please call

## 2019-01-24 NOTE — Telephone Encounter (Signed)
Called the patient back. She states that since taking the two medication she has developed a rash and itching. She states that she had been using benadryl as well which helped at first with the breakout but then would wear off. Patient states that she didn't take the either medication last night and has not been itchy today. She states that those two medications are the only thing that has been new. She states that she has not tried taking one over the other but that she had been sleeping well with it. I advised the patient to take one medication tonight and see if she has the reaction and then give a couple days and see if she has same reaction with taking the other one that way we can hopefully narrow it down to which one is possibly causing the reaction. Advised the patient to continue with the benadryl and try hydrocortisone on top of the rash to help with itching and the rash. Pt verbalized understanding and will keep me posted on everything after attempting individually.

## 2019-02-13 ENCOUNTER — Other Ambulatory Visit: Payer: Self-pay | Admitting: Neurology

## 2019-02-14 ENCOUNTER — Other Ambulatory Visit: Payer: Self-pay

## 2019-02-14 ENCOUNTER — Ambulatory Visit (INDEPENDENT_AMBULATORY_CARE_PROVIDER_SITE_OTHER): Payer: 59 | Admitting: Internal Medicine

## 2019-02-14 ENCOUNTER — Encounter: Payer: Self-pay | Admitting: Internal Medicine

## 2019-02-14 VITALS — BP 132/80 | HR 77 | Temp 98.5°F | Ht 66.8 in | Wt 320.2 lb

## 2019-02-14 DIAGNOSIS — D508 Other iron deficiency anemias: Secondary | ICD-10-CM

## 2019-02-14 DIAGNOSIS — Z79899 Other long term (current) drug therapy: Secondary | ICD-10-CM

## 2019-02-14 DIAGNOSIS — J0100 Acute maxillary sinusitis, unspecified: Secondary | ICD-10-CM

## 2019-02-14 DIAGNOSIS — Z6841 Body Mass Index (BMI) 40.0 and over, adult: Secondary | ICD-10-CM

## 2019-02-14 DIAGNOSIS — E538 Deficiency of other specified B group vitamins: Secondary | ICD-10-CM | POA: Diagnosis not present

## 2019-02-14 DIAGNOSIS — J339 Nasal polyp, unspecified: Secondary | ICD-10-CM

## 2019-02-14 DIAGNOSIS — Z9884 Bariatric surgery status: Secondary | ICD-10-CM

## 2019-02-14 DIAGNOSIS — N951 Menopausal and female climacteric states: Secondary | ICD-10-CM | POA: Insufficient documentation

## 2019-02-14 MED ORDER — AMOXICILLIN-POT CLAVULANATE 875-125 MG PO TABS: 1.0000 | ORAL_TABLET | Freq: Two times a day (BID) | ORAL | 0 refills | Status: AC

## 2019-02-14 NOTE — Patient Instructions (Signed)
Check which weight loss medications your insurance is willing to cover.    Sinusitis, Adult Sinusitis is soreness and swelling (inflammation) of your sinuses. Sinuses are hollow spaces in the bones around your face. They are located:  Around your eyes.  In the middle of your forehead.  Behind your nose.  In your cheekbones. Your sinuses and nasal passages are lined with a fluid called mucus. Mucus drains out of your sinuses. Swelling can trap mucus in your sinuses. This lets germs (bacteria, virus, or fungus) grow, which leads to infection. Most of the time, this condition is caused by a virus. What are the causes? This condition is caused by:  Allergies.  Asthma.  Germs.  Things that block your nose or sinuses.  Growths in the nose (nasal polyps).  Chemicals or irritants in the air.  Fungus (rare). What increases the risk? You are more likely to develop this condition if:  You have a weak body defense system (immune system).  You do a lot of swimming or diving.  You use nasal sprays too much.  You smoke. What are the signs or symptoms? The main symptoms of this condition are pain and a feeling of pressure around the sinuses. Other symptoms include:  Stuffy nose (congestion).  Runny nose (drainage).  Swelling and warmth in the sinuses.  Headache.  Toothache.  A cough that may get worse at night.  Mucus that collects in the throat or the back of the nose (postnasal drip).  Being unable to smell and taste.  Being very tired (fatigue).  A fever.  Sore throat.  Bad breath. How is this diagnosed? This condition is diagnosed based on:  Your symptoms.  Your medical history.  A physical exam.  Tests to find out if your condition is short-term (acute) or long-term (chronic). Your doctor may: ? Check your nose for growths (polyps). ? Check your sinuses using a tool that has a light (endoscope). ? Check for allergies or germs. ? Do imaging tests,  such as an MRI or CT scan. How is this treated? Treatment for this condition depends on the cause and whether it is short-term or long-term.  If caused by a virus, your symptoms should go away on their own within 10 days. You may be given medicines to relieve symptoms. They include: ? Medicines that shrink swollen tissue in the nose. ? Medicines that treat allergies (antihistamines). ? A spray that treats swelling of the nostrils. ? Rinses that help get rid of thick mucus in your nose (nasal saline washes).  If caused by bacteria, your doctor may wait to see if you will get better without treatment. You may be given antibiotic medicine if you have: ? A very bad infection. ? A weak body defense system.  If caused by growths in the nose, you may need to have surgery. Follow these instructions at home: Medicines  Take, use, or apply over-the-counter and prescription medicines only as told by your doctor. These may include nasal sprays.  If you were prescribed an antibiotic medicine, take it as told by your doctor. Do not stop taking the antibiotic even if you start to feel better. Hydrate and humidify   Drink enough water to keep your pee (urine) pale yellow.  Use a cool mist humidifier to keep the humidity level in your home above 50%.  Breathe in steam for 10-15 minutes, 3-4 times a day, or as told by your doctor. You can do this in the bathroom while a hot  shower is running.  Try not to spend time in cool or dry air. Rest  Rest as much as you can.  Sleep with your head raised (elevated).  Make sure you get enough sleep each night. General instructions   Put a warm, moist washcloth on your face 3-4 times a day, or as often as told by your doctor. This will help with discomfort.  Wash your hands often with soap and water. If there is no soap and water, use hand sanitizer.  Do not smoke. Avoid being around people who are smoking (secondhand smoke).  Keep all follow-up  visits as told by your doctor. This is important. Contact a doctor if:  You have a fever.  Your symptoms get worse.  Your symptoms do not get better within 10 days. Get help right away if:  You have a very bad headache.  You cannot stop throwing up (vomiting).  You have very bad pain or swelling around your face or eyes.  You have trouble seeing.  You feel confused.  Your neck is stiff.  You have trouble breathing. Summary  Sinusitis is swelling of your sinuses. Sinuses are hollow spaces in the bones around your face.  This condition is caused by tissues in your nose that become inflamed or swollen. This traps germs. These can lead to infection.  If you were prescribed an antibiotic medicine, take it as told by your doctor. Do not stop taking it even if you start to feel better.  Keep all follow-up visits as told by your doctor. This is important. This information is not intended to replace advice given to you by your health care provider. Make sure you discuss any questions you have with your health care provider. Document Released: 05/18/2008 Document Revised: 05/02/2018 Document Reviewed: 05/02/2018 Elsevier Interactive Patient Education  2019 [employer].

## 2019-02-14 NOTE — Progress Notes (Signed)
Subjective:     Patient ID: Erin Jimenez , female    DOB: May 02, 1971 , 48 y.o.   MRN: [id number]   Chief Complaint  Patient presents with  . New Patient (Initial Visit)    HPI  1-Pt is here to establish care with [place]. Has not seen by a PCP since 2016 since he retired. Has been going to urgent care. Her main concern is that she needs her B12 refilled. Her husband gives them to her since her lap band.  Admits of being anemic, but does not recall having Fe levels checked.  Takes Magnesium and Potasium to prevent nek and leg cramps and this was recommended by her neurosurgeon.   2- onset of rhinitis off and on since December. Was diagnosed with sinusitis and given 2 rounds of antibiotics since then, last time was January but was better but not fully resolved. Then since yesterday has recurrent rhinitis R face sinus pain. Has never seen ENT or had sinus surgeries.   Past Medical History:  Diagnosis Date  . B12 deficiency   . Chiari malformation   . Chronic headache   . Depression   . Endometriosis   . GERD (gastroesophageal reflux disease)   . Insomnia   . Irritable bowel syndrome   . Obesity   . Pseudomeningocele, acquired      Family History  Problem Relation Age of Onset  . Colon polyps Mother   . Diabetes Mother   . Lymphoma Mother        CNS  . Cancer Mother   . Colon polyps Father   . Lymphoma Father   . Cancer Father   . Colon cancer Neg Hx   . Rectal cancer Neg Hx   . Stomach cancer Neg Hx      Current Outpatient Medications:  .  cholecalciferol (VITAMIN D) 1000 UNITS tablet, Take 2,000 Units by mouth daily., Disp: , Rfl:  .  cyanocobalamin (,VITAMIN B-12,) 1000 MCG/ML injection, Inject 1 mL (1,000 mcg total) into the muscle every 30 (thirty) days., Disp: 25 mL, Rfl: 0 .  cyclobenzaprine (FLEXERIL) 10 MG tablet, Take 10 mg by mouth at bedtime., Disp: , Rfl:  .  desvenlafaxine (PRISTIQ) 50 MG 24 hr tablet, TAKE 1 TABLET BY MOUTH EVERY DAY, Disp: 90 tablet, Rfl:  0 .  dicyclomine (BENTYL) 10 MG capsule, Take 1 capsule (10 mg total) by mouth 3 (three) times daily as needed (For cramping )., Disp: 30 capsule, Rfl: 4 .  esomeprazole (NEXIUM) 40 MG capsule, Take 1 capsule (40 mg total) by mouth daily before breakfast., Disp: 90 capsule, Rfl: 0 .  Est Estrogens-Methyltest (ESTRATEST PO), Take by mouth. Takes 1.5 tab daily., Disp: , Rfl:  .  fluticasone (FLONASE) 50 MCG/ACT nasal spray, Place 2 sprays into the nose daily. Taking as needed, Disp: , Rfl:  .  HYDROCODONE-ACETAMINOPHEN PO, Take by mouth. 5-325MG  TABS, TAKES 1.5 TAB QHS, Disp: , Rfl:  .  linaclotide (LINZESS) 145 MCG CAPS capsule, Take 1 capsule (145 mcg total) by mouth daily before breakfast., Disp: 90 capsule, Rfl: 3 .  Loratadine (CLARITIN PO), Take 20 tablets by mouth daily. , Disp: , Rfl:  .  Magnesium 250 MG TABS, Take by mouth., Disp: , Rfl:  .  naproxen sodium (ANAPROX) 220 MG tablet, Take 220 mg by mouth. Takes 4-5 tabs daily., Disp: , Rfl:  .  Potassium 75 MG TABS, Take 1 tablet by mouth daily., Disp: , Rfl:  .  zolpidem (AMBIEN) 10  MG tablet, Take 10 mg by mouth at bedtime as needed for sleep., Disp: , Rfl:    Allergies  Allergen Reactions  . Ciprofloxacin Nausea Only  . Doxycycline Nausea Only  . Oxycodone Nausea Only  . Sulfa Antibiotics Nausea Only  . Xanax [Alprazolam]      Review of Systems  Constitutional: Positive for chills, diaphoresis and fever.       Temp yesterday 99, and stayed home.  Has gained all her wt since lap band.   HENT: Positive for rhinorrhea and sinus pain. Negative for congestion, ear discharge, ear pain and tinnitus.        Has some R ear pain yesterday  Respiratory: Negative for cough.   Gastrointestinal: Positive for constipation.       Intermittent constipation which is alleviated with Linzess, but cant afford it right now, so she if she drinks [employer] coffee seems to help her stay regular.   Musculoskeletal: Positive for neck pain. Negative for  back pain.       Has chronic neck pain  Neurological: Negative for dizziness.     Today's Vitals   02/14/19 0920  BP: 132/80  Pulse: 77  Temp: 98.5 F (36.9 C)  TempSrc: Oral  SpO2: 97%  Weight: (!) 320 lb 3.2 oz (145.2 kg)  Height: 5' 6.8" (1.697 m)   Body mass index is 50.45 kg/m.   Objective:  Physical Exam Vitals signs and nursing note reviewed.  Constitutional:      General: She is not in acute distress.    Appearance: She is obese. She is not toxic-appearing.  HENT:     Head: Normocephalic.     Comments: Both TM's are dull and gray. R nares reveals swollen area which looks like a polyp, but the distal end is very erythematous and upper portion is gray in color. This is not present on the L. No signs of nose bleeding.  She is moderately tender on R maxillary sinus and minimally on the R.     Right Ear: Ear canal and external ear normal.     Left Ear: Ear canal and external ear normal.     Mouth/Throat:     Mouth: Mucous membranes are moist.  Eyes:     General: No scleral icterus.       Right eye: No discharge.        Left eye: No discharge.     Conjunctiva/sclera: Conjunctivae normal.     Pupils: Pupils are equal, round, and reactive to light.  Neck:     Musculoskeletal: Neck supple. No neck rigidity.  Cardiovascular:     Rate and Rhythm: Normal rate and regular rhythm.     Pulses: Normal pulses.     Heart sounds: No murmur.  Pulmonary:     Effort: Pulmonary effort is normal.     Breath sounds: No rales.  Musculoskeletal: Normal range of motion.  Lymphadenopathy:     Cervical: No cervical adenopathy.  Skin:    General: Skin is warm and dry.  Neurological:     Mental Status: She is alert and oriented to person, place, and time.     Gait: Gait normal.  Psychiatric:        Mood and Affect: Mood normal.        Behavior: Behavior normal.        Thought Content: Thought content normal.        Judgment: Judgment normal.     Assessment And Plan:  1.  Vitamin B 12 deficiency- she will call or send a mychart message with the dose per injection she gets of B12 once a month. Then I will sent the Rx - Vitamin B12  2. Other iron deficiency anemia- chronic, unknown status.  - CBC no Diff - Iron and TIBC - Ferritin  3. Long-term use of high-risk medication-chronic.  - CMP14 + Anion Gap  4. Class 3 severe obesity due to excess calories with serious comorbidity and body mass index (BMI) of 50.0 to 59.9 in adult [facility])- chronic. She is interested in wt loss, we will address this during her physical.   5. Nose polyp- could be the cause of recurrent sinus pain.  - Ambulatory referral to ENT  6. Acute maxillary sinusitis, recurrence not specified- acute. Placed on Augmentin.   Fu for physical and discussion on Wt loss. Advised to check what wt loss medications her insurance pays for.     Eleno Weimar RODRIGUEZ-SOUTHWORTH, PA-C

## 2019-02-15 LAB — CMP14 + ANION GAP
ALT: 22 IU/L (ref 0–32)
AST: 14 IU/L (ref 0–40)
Albumin/Globulin Ratio: 1.5 (ref 1.2–2.2)
Albumin: 3.9 g/dL (ref 3.8–4.8)
Alkaline Phosphatase: 112 IU/L (ref 39–117)
Anion Gap: 15 mmol/L (ref 10.0–18.0)
BUN/Creatinine Ratio: 15 (ref 9–23)
BUN: 15 mg/dL (ref 6–24)
Bilirubin Total: 0.2 mg/dL (ref 0.0–1.2)
CO2: 25 mmol/L (ref 20–29)
Calcium: 8.9 mg/dL (ref 8.7–10.2)
Chloride: 101 mmol/L (ref 96–106)
Creatinine, Ser: 0.98 mg/dL (ref 0.57–1.00)
GFR calc Af Amer: 78 mL/min/{1.73_m2} (ref >59)
GFR calc non Af Amer: 68 mL/min/{1.73_m2} (ref >59)
Globulin, Total: 2.6 g/dL (ref 1.5–4.5)
Glucose: 67 mg/dL (ref 65–99)
Potassium: 4.6 mmol/L (ref 3.5–5.2)
Sodium: 141 mmol/L (ref 134–144)
Total Protein: 6.5 g/dL (ref 6.0–8.5)

## 2019-02-15 LAB — IRON AND TIBC
Iron Saturation: 16 % (ref 15–55)
Iron: 64 ug/dL (ref 27–159)
Total Iron Binding Capacity: 412 ug/dL (ref 250–450)
UIBC: 348 ug/dL (ref 131–425)

## 2019-02-15 LAB — CBC
Hematocrit: 38.5 % (ref 34.0–46.6)
Hemoglobin: 12.5 g/dL (ref 11.1–15.9)
MCH: 26.2 pg — ABNORMAL LOW (ref 26.6–33.0)
MCHC: 32.5 g/dL (ref 31.5–35.7)
MCV: 81 fL (ref 79–97)
Platelets: 360 10*3/uL (ref 150–450)
RBC: 4.78 x10E6/uL (ref 3.77–5.28)
RDW: 15.3 % (ref 11.7–15.4)
WBC: 10.8 10*3/uL (ref 3.4–10.8)

## 2019-02-15 LAB — FERRITIN: Ferritin: 15 ng/mL (ref 15–150)

## 2019-02-15 LAB — VITAMIN B12: Vitamin B-12: 389 pg/mL (ref 232–1245)

## 2019-02-16 ENCOUNTER — Encounter: Payer: Self-pay | Admitting: Internal Medicine

## 2019-02-16 DIAGNOSIS — Z8349 Family history of other endocrine, nutritional and metabolic diseases: Secondary | ICD-10-CM

## 2019-02-22 MED ORDER — CYANOCOBALAMIN 1000 MCG/ML IJ SOLN: 1000.0000 ug | INTRAMUSCULAR | 5 refills | Status: DC

## 2019-02-28 ENCOUNTER — Encounter: Payer: Self-pay | Admitting: Internal Medicine

## 2019-05-07 ENCOUNTER — Other Ambulatory Visit: Payer: Self-pay | Admitting: Neurology

## 2019-05-17 DIAGNOSIS — J3089 Other allergic rhinitis: Secondary | ICD-10-CM | POA: Insufficient documentation

## 2019-05-20 ENCOUNTER — Other Ambulatory Visit: Payer: Self-pay | Admitting: Neurology

## 2019-05-29 ENCOUNTER — Telehealth: Payer: Self-pay | Admitting: Neurology

## 2019-05-29 NOTE — Telephone Encounter (Signed)
Due to current COVID 19 pandemic, our office is severely reducing in office visits until further notice, in order to minimize the risk to our patients and healthcare providers.   I called patient regarding her 6/22 appt to discuss a virtual visit via [employer]. Patient accepted and gave consent for a virtual visit. I have sent patient the visit information via MyChart.  Pt understands that although there may be some limitations with this type of visit, we will take all precautions to reduce any security or privacy concerns.  Pt understands that this will be treated like an in office visit and we will file with pt's insurance, and there may be a patient responsible charge related to this service.

## 2019-05-31 NOTE — Telephone Encounter (Signed)
Called the patient to review her chart for the mychart video visit. She was unable to complete this due to being at work. She will call back.

## 2019-06-01 ENCOUNTER — Encounter: Payer: Self-pay | Admitting: Neurology

## 2019-06-05 ENCOUNTER — Telehealth (INDEPENDENT_AMBULATORY_CARE_PROVIDER_SITE_OTHER): Payer: 59 | Admitting: Neurology

## 2019-06-05 ENCOUNTER — Encounter: Payer: Self-pay | Admitting: Neurology

## 2019-06-05 DIAGNOSIS — G4731 Primary central sleep apnea: Secondary | ICD-10-CM | POA: Diagnosis not present

## 2019-06-05 DIAGNOSIS — M5481 Occipital neuralgia: Secondary | ICD-10-CM

## 2019-06-05 DIAGNOSIS — Q019 Encephalocele, unspecified: Secondary | ICD-10-CM

## 2019-06-05 DIAGNOSIS — E662 Morbid (severe) obesity with alveolar hypoventilation: Secondary | ICD-10-CM

## 2019-06-05 DIAGNOSIS — G4733 Obstructive sleep apnea (adult) (pediatric): Secondary | ICD-10-CM | POA: Diagnosis not present

## 2019-06-05 DIAGNOSIS — G4739 Other sleep apnea: Secondary | ICD-10-CM

## 2019-06-05 DIAGNOSIS — K582 Mixed irritable bowel syndrome: Secondary | ICD-10-CM

## 2019-06-05 DIAGNOSIS — Z9989 Dependence on other enabling machines and devices: Secondary | ICD-10-CM
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
## 2019-07-14 ENCOUNTER — Other Ambulatory Visit: Payer: Self-pay | Admitting: Neurology

## 2019-08-16 ENCOUNTER — Other Ambulatory Visit: Payer: Self-pay | Admitting: Internal Medicine

## 2019-08-16 DIAGNOSIS — Z8349 Family history of other endocrine, nutritional and metabolic diseases: Secondary | ICD-10-CM

## 2019-08-19 ENCOUNTER — Other Ambulatory Visit: Payer: Self-pay | Admitting: Neurology

## 2019-10-05 ENCOUNTER — Encounter: Payer: Self-pay | Admitting: Internal Medicine

## 2019-10-05 ENCOUNTER — Other Ambulatory Visit: Payer: Self-pay

## 2019-10-05 ENCOUNTER — Ambulatory Visit (INDEPENDENT_AMBULATORY_CARE_PROVIDER_SITE_OTHER): Payer: 59 | Admitting: Internal Medicine

## 2019-10-05 VITALS — BP 124/76 | HR 75 | Temp 97.6°F | Ht 65.5 in | Wt 317.4 lb

## 2019-10-05 DIAGNOSIS — Z6841 Body Mass Index (BMI) 40.0 and over, adult: Secondary | ICD-10-CM

## 2019-10-05 DIAGNOSIS — E538 Deficiency of other specified B group vitamins: Secondary | ICD-10-CM

## 2019-10-05 DIAGNOSIS — R7303 Prediabetes: Secondary | ICD-10-CM

## 2019-10-05 DIAGNOSIS — Z23 Encounter for immunization: Secondary | ICD-10-CM | POA: Diagnosis not present

## 2019-10-05 DIAGNOSIS — Z0000 Encounter for general adult medical examination without abnormal findings: Secondary | ICD-10-CM | POA: Diagnosis not present

## 2019-10-05 DIAGNOSIS — R609 Edema, unspecified: Secondary | ICD-10-CM

## 2019-10-05 LAB — POCT URINALYSIS DIPSTICK
Bilirubin, UA: NEGATIVE
Blood, UA: NEGATIVE
Glucose, UA: NEGATIVE
Ketones, UA: NEGATIVE
Leukocytes, UA: NEGATIVE
Nitrite, UA: NEGATIVE
Protein, UA: NEGATIVE
Spec Grav, UA: 1.01 (ref 1.010–1.025)
Urobilinogen, UA: 0.2 E.U./dL
pH, UA: 5.5 (ref 5.0–8.0)

## 2019-10-05 NOTE — Patient Instructions (Signed)
 Edema  Edema is when you have too much fluid in your body or under your skin. Edema may make your legs, feet, and ankles swell up. Swelling is also common in looser tissues, like around your eyes. This is a common condition. It gets more common as you get older. There are many possible causes of edema. Eating too much salt (sodium) and being on your feet or sitting for a long time can cause edema in your legs, feet, and ankles. Hot weather may make edema worse. Edema is usually painless. Your skin may look swollen or shiny. Follow these instructions at home:  Keep the swollen body part raised (elevated) above the level of your heart when you are sitting or lying down.  Do not sit still or stand for a long time.  Do not wear tight clothes. Do not wear garters on your upper legs.  Exercise your legs. This can help the swelling go down.  Wear elastic bandages or support stockings as told by your doctor.  Eat a low-salt (low-sodium) diet to reduce fluid as told by your doctor.  Depending on the cause of your swelling, you may need to limit how much fluid you drink (fluid restriction).  Take over-the-counter and prescription medicines only as told by your doctor. Contact a doctor if:  Treatment is not working.  You have heart, liver, or kidney disease and have symptoms of edema.  You have sudden and unexplained weight gain. Get help right away if:  You have shortness of breath or chest pain.  You cannot breathe when you lie down.  You have pain, redness, or warmth in the swollen areas.  You have heart, liver, or kidney disease and get edema all of a sudden.  You have a fever and your symptoms get worse all of a sudden. Summary  Edema is when you have too much fluid in your body or under your skin.  Edema may make your legs, feet, and ankles swell up. Swelling is also common in looser tissues, like around your eyes.  Raise (elevate) the swollen body part above the level of  your heart when you are sitting or lying down.  Follow your doctor's instructions about diet and how much fluid you can drink (fluid restriction). This information is not intended to replace advice given to you by your health care provider. Make sure you discuss any questions you have with your health care provider. Document Released: 05/18/2008 Document Revised: 12/03/2017 Document Reviewed: 12/18/2016 Elsevier Patient Education  2020 [employer]   Preventive Care 40-64 Years Old, Female Preventive care refers to visits with your health care provider and lifestyle choices that can promote health and wellness. This includes:  A yearly physical exam. This may also be called an annual well check.  Regular dental visits and eye exams.  Immunizations.  Screening for certain conditions.  Healthy lifestyle choices, such as eating a healthy diet, getting regular exercise, not using drugs or products that contain nicotine and tobacco, and limiting alcohol use. What can I expect for my preventive care visit? Physical exam Your health care provider will check your:  Height and weight. This may be used to calculate body mass index (BMI), which tells if you are at a healthy weight.  Heart rate and blood pressure.  Skin for abnormal spots. Counseling Your health care provider may ask you questions about your:  Alcohol, tobacco, and drug use.  Emotional well-being.  Home and relationship well-being.  Sexual activity.  Eating   habits.  Work and work environment.  Method of birth control.  Menstrual cycle.  Pregnancy history. What immunizations do I need?  Influenza (flu) vaccine  This is recommended every year. Tetanus, diphtheria, and pertussis (Tdap) vaccine  You may need a Td booster every 10 years. Varicella (chickenpox) vaccine  You may need this if you have not been vaccinated. Zoster (shingles) vaccine  You may need this after age 60. Measles, mumps, and  rubella (MMR) vaccine  You may need at least one dose of MMR if you were born in 1957 or later. You may also need a second dose. Pneumococcal conjugate (PCV13) vaccine  You may need this if you have certain conditions and were not previously vaccinated. Pneumococcal polysaccharide (PPSV23) vaccine  You may need one or two doses if you smoke cigarettes or if you have certain conditions. Meningococcal conjugate (MenACWY) vaccine  You may need this if you have certain conditions. Hepatitis A vaccine  You may need this if you have certain conditions or if you travel or work in places where you may be exposed to hepatitis A. Hepatitis B vaccine  You may need this if you have certain conditions or if you travel or work in places where you may be exposed to hepatitis B. Haemophilus influenzae type b (Hib) vaccine  You may need this if you have certain conditions. Human papillomavirus (HPV) vaccine  If recommended by your health care provider, you may need three doses over 6 months. You may receive vaccines as individual doses or as more than one vaccine together in one shot (combination vaccines). Talk with your health care provider about the risks and benefits of combination vaccines. What tests do I need? Blood tests  Lipid and cholesterol levels. These may be checked every 5 years, or more frequently if you are over 50 years old.  Hepatitis C test.  Hepatitis B test. Screening  Lung cancer screening. You may have this screening every year starting at age 55 if you have a 30-pack-year history of smoking and currently smoke or have quit within the past 15 years.  Colorectal cancer screening. All adults should have this screening starting at age 50 and continuing until age 75. Your health care provider may recommend screening at age 45 if you are at increased risk. You will have tests every 1-10 years, depending on your results and the type of screening test.  Diabetes screening. This  is done by checking your blood sugar (glucose) after you have not eaten for a while (fasting). You may have this done every 1-3 years.  Mammogram. This may be done every 1-2 years. Talk with your health care provider about when you should start having regular mammograms. This may depend on whether you have a family history of breast cancer.  BRCA-related cancer screening. This may be done if you have a family history of breast, ovarian, tubal, or peritoneal cancers.  Pelvic exam and Pap test. This may be done every 3 years starting at age 21. Starting at age 30, this may be done every 5 years if you have a Pap test in combination with an HPV test. Other tests  Sexually transmitted disease (STD) testing.  Bone density scan. This is done to screen for osteoporosis. You may have this scan if you are at high risk for osteoporosis. Follow these instructions at home: Eating and drinking  Eat a diet that includes fresh fruits and vegetables, whole grains, lean protein, and low-fat dairy.  Take vitamin and mineral   supplements as recommended by your health care provider.  Do not drink alcohol if: ? Your health care provider tells you not to drink. ? You are pregnant, may be pregnant, or are planning to become pregnant.  If you drink alcohol: ? Limit how much you have to 0-1 drink a day. ? Be aware of how much alcohol is in your drink. In the [place] one drink equals one 12 oz bottle of beer (355 mL), one 5 oz glass of wine (148 mL), or one 1 oz glass of hard liquor (44 mL). Lifestyle  Take daily care of your teeth and gums.  Stay active. Exercise for at least 30 minutes on 5 or more days each week.  Do not use any products that contain nicotine or tobacco, such as cigarettes, e-cigarettes, and chewing tobacco. If you need help quitting, ask your health care provider.  If you are sexually active, practice safe sex. Use a condom or other form of birth control (contraception) in order to prevent  pregnancy and STIs (sexually transmitted infections).  If told by your health care provider, take low-dose aspirin daily starting at age 50. What's next?  Visit your health care provider once a year for a well check visit.  Ask your health care provider how often you should have your eyes and teeth checked.  Stay up to date on all vaccines. This information is not intended to replace advice given to you by your health care provider. Make sure you discuss any questions you have with your health care provider. Document Released: 12/27/2015 Document Revised: 08/11/2018 Document Reviewed: 08/11/2018 Elsevier Patient Education  2020 Elsevier Inc.   

## 2019-10-05 NOTE — Progress Notes (Signed)
Subjective:     Patient ID: Erin Jimenez , female    DOB: 01/22/1971 , 48 y.o.   MRN: [id number]   Chief Complaint  Patient presents with  . Annual Exam    HPI  Here for physical without a pap. Has been healthy through corona pandemic. 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   Had colonoscopy 2014 which was normal and the next one is due 2014.  Eye exam is up to date.   Past Medical History:  Diagnosis Date  . B12 deficiency   . Chiari malformation   . Chronic headache   . Depression   . Endometriosis   . GERD (gastroesophageal reflux disease)   . Insomnia   . Irritable bowel syndrome   . Obesity   . Pseudomeningocele, acquired      Family History  Problem Relation Age of Onset  . Colon polyps Mother   . Diabetes Mother   . Brain cancer Mother   . Colon polyps Father   . Non-Hodgkin's lymphoma Father   . Bladder Cancer Father   . Colon cancer Neg Hx   . Rectal cancer Neg Hx   . Stomach cancer Neg Hx      Current Outpatient Medications:  .  ALPRAZolam (XANAX) 0.5 MG tablet, TAKE 1/2 TABLET (0.25 MG TOTAL) BY MOUTH AT BEDTIME AS NEEDED FOR ANXIETY., Disp: 45 tablet, Rfl: 5 .  cholecalciferol (VITAMIN D) 1000 UNITS tablet, Take 2,000 Units by mouth daily., Disp: , Rfl:  .  cyanocobalamin (,VITAMIN B-12,) 1000 MCG/ML injection, INJECT 1 ML (1,000 MCG TOTAL) INTO THE MUSCLE EVERY 30 (THIRTY) DAYS., Disp: 1 mL, Rfl: 5 .  cyclobenzaprine (FLEXERIL) 10 MG tablet, Take 10 mg by mouth at bedtime., Disp: , Rfl:  .  Desvenlafaxine ER (PRISTIQ) 50 MG TB24, Take 1 tablet by mouth daily., Disp: , Rfl:  .  Desvenlafaxine ER (PRISTIQ) 50 MG TB24, TAKE 1 TABLET BY MOUTH EVERY DAY, Disp: 90 tablet, Rfl: 2 .  dicyclomine (BENTYL) 10 MG capsule, Take 1 capsule (10 mg total) by mouth 3 (three) times daily as needed (For cramping )., Disp: 30 capsule, Rfl: 4 .  esomeprazole (NEXIUM) 40 MG capsule, Take 1 capsule (40 mg total) by mouth daily before breakfast., Disp: 90 capsule, Rfl: 0 .   Est Estrogens-Methyltest (ESTRATEST PO), Take by mouth. Takes 1.5 tab daily., Disp: , Rfl:  .  fluticasone (FLONASE) 50 MCG/ACT nasal spray, Place 2 sprays into the nose daily. Taking as needed, Disp: , Rfl:  .  HYDROCODONE-ACETAMINOPHEN PO, Take by mouth. 5-325MG  TABS, TAKES 1.5 TAB QHS, Disp: , Rfl:  .  linaclotide (LINZESS) 145 MCG CAPS capsule, Take 1 capsule (145 mcg total) by mouth daily before breakfast., Disp: 90 capsule, Rfl: 3 .  Loratadine (CLARITIN PO), Take 20 tablets by mouth daily. , Disp: , Rfl:  .  Magnesium 250 MG TABS, Take by mouth., Disp: , Rfl:  .  naproxen sodium (ANAPROX) 220 MG tablet, Take 220 mg by mouth. Takes 4-5 tabs daily., Disp: , Rfl:  .  Potassium 75 MG TABS, Take 1 tablet by mouth daily., Disp: , Rfl:  .  traZODone (DESYREL) 50 MG tablet, TAKE 1 TABLET BY MOUTH AT BEDTIME AS NEEDED FOR SLEEP, Disp: 90 tablet, Rfl: 1   Allergies  Allergen Reactions  . Ciprofloxacin Nausea Only  . Doxycycline Nausea Only  . Oxycodone Nausea Only  . Sulfa Antibiotics Nausea Only  . Xanax [Alprazolam] Hives     Review  of Systems  + for neck pain, stiffness and paresthesia due to Chiari malformation which is a chroniccondition. Gets edema at end of work day, R>L, has hard time loosing wt even though she does not eat much. The rest is neg.  Today's Vitals   10/05/19 0910  BP: 124/76  Pulse: 75  Temp: 97.6 F (36.4 C)  TempSrc: Oral  Weight: (!) 317 lb 6.4 oz (144 kg)  Height: 5' 5.5" (1.664 m)  Wt. Is down 3 lbs since March 2020. Body mass index is 52.01 kg/m.   Objective:  Physical Exam  BP 124/76 (BP Location: Left Arm, Patient Position: Sitting, Cuff Size: Normal)   Pulse 75   Temp 97.6 F (36.4 C) (Oral)   Ht 5' 5.5" (1.664 m)   Wt (!) 317 lb 6.4 oz (144 kg)   BMI 52.01 kg/m   General Appearance:    Alert, cooperative, no distress, appears stated age  Head:    Normocephalic, without obvious abnormality, atraumatic  Eyes:    PERRL, conjunctiva/corneas  clear, EOM's intact, both eyes  Ears:    Normal TM's and external ear canals, both ears  Nose:   Nares normal, septum midline, mucosa normal, no drainage    or sinus tenderness  Throat:   Lips, mucosa, and tongue normal; teeth and gums normal  Neck:   Supple, symmetrical, trachea midline, no adenopathy;    thyroid:  no enlargement/tenderness/nodules.  Back:     Symmetric, no curvature, ROM normal.  Lungs:     Clear to auscultation bilaterally, respirations unlabored  Chest Wall:    No tenderness or deformity   Heart:    Regular rate and rhythm, S1 and S2 normal, no murmur, rub   or gallop  Breast Exam:    No tenderness, masses, or nipple abnormality  Abdomen:     Soft, non-tender, bowel sounds active all four quadrants,    no masses, no organomegaly        Extremities:   Extremities normal, atraumatic, no cyanosis. Has +1/4 pitting edema of L lower leg.   Pulses:   2+ and symmetric all extremities  Skin:   Skin color, texture, turgor normal, no rashes or lesions. Has slight tan discoloration on her mid R shin.   Lymph nodes:   Cervical, supraclavicular, and axillary nodes normal  Neurologic:   CNII-XII intact, normal strength, sensation and  +2/4reflexes    On L brachial, both lower legs. +1/4 R brachial.  Romberg's is normal. Tandem gait was a little unsteady, but has been told by neurology this is expected with her Chiari malformation. Heel and tip toe gait is normal.       Assessment And Plan:    1. Need for vaccination- routine - Flu Vaccine QUAD 6+ mos PF IM (Fluarix Quad PF)  2. Encounter for physical examination- routine    FU 1y - POCT Urinalysis Dipstick (81002) - CBC no Diff - Lipid Profile - TSH - T4, Free - T3, free - CMP14 + Anion Gap  3. Class 3 severe obesity due to excess calories with serious comorbidity and body mass index (BMI) of 50.0 to 59.9 in adult [facility])- chronic, not improving. She is interested in going to a wt loss program.  - Amb Ref to Medical  Weight Management  4. Vitamin B 12 deficiency- chronic. Self administers B12 injections at home.      May continue B12 injections.  5. Prediabetes- chronic. Sent to [employer] [facility] loss program.  FU 6 months -  Hemoglobin A1c 6 Mild edema- advised to wear support hoses- knee high and start with # 12 and if this does not help, then advance to # 14 and so forth.     Romuald Mccaslin RODRIGUEZ-SOUTHWORTH, PA-C    THE PATIENT IS ENCOURAGED TO PRACTICE SOCIAL DISTANCING DUE TO THE COVID-19 PANDEMIC.

## 2019-10-06 LAB — HEMOGLOBIN A1C
Est. average glucose Bld gHb Est-mCnc: 123 mg/dL
Hgb A1c MFr Bld: 5.9 % — ABNORMAL HIGH (ref 4.8–5.6)

## 2019-10-06 LAB — TSH: TSH: 1.62 u[IU]/mL (ref 0.450–4.500)

## 2019-10-06 LAB — CMP14 + ANION GAP
ALT: 21 IU/L (ref 0–32)
AST: 20 IU/L (ref 0–40)
Albumin/Globulin Ratio: 1.4 (ref 1.2–2.2)
Albumin: 4.1 g/dL (ref 3.8–4.8)
Alkaline Phosphatase: 138 IU/L — ABNORMAL HIGH (ref 39–117)
Anion Gap: 13 mmol/L (ref 10.0–18.0)
BUN/Creatinine Ratio: 7 — ABNORMAL LOW (ref 9–23)
BUN: 8 mg/dL (ref 6–24)
Bilirubin Total: 0.4 mg/dL (ref 0.0–1.2)
CO2: 25 mmol/L (ref 20–29)
Calcium: 9.8 mg/dL (ref 8.7–10.2)
Chloride: 102 mmol/L (ref 96–106)
Creatinine, Ser: 1.09 mg/dL — ABNORMAL HIGH (ref 0.57–1.00)
GFR calc Af Amer: 68 mL/min/{1.73_m2} (ref >59)
GFR calc non Af Amer: 59 mL/min/{1.73_m2} — ABNORMAL LOW (ref >59)
Globulin, Total: 2.9 g/dL (ref 1.5–4.5)
Glucose: 83 mg/dL (ref 65–99)
Potassium: 4.7 mmol/L (ref 3.5–5.2)
Sodium: 140 mmol/L (ref 134–144)
Total Protein: 7 g/dL (ref 6.0–8.5)

## 2019-10-06 LAB — CBC
Hematocrit: 37.7 % (ref 34.0–46.6)
Hemoglobin: 11.9 g/dL (ref 11.1–15.9)
MCH: 25.3 pg — ABNORMAL LOW (ref 26.6–33.0)
MCHC: 31.6 g/dL (ref 31.5–35.7)
MCV: 80 fL (ref 79–97)
Platelets: 292 10*3/uL (ref 150–450)
RBC: 4.71 x10E6/uL (ref 3.77–5.28)
RDW: 16.5 % — ABNORMAL HIGH (ref 11.7–15.4)
WBC: 7.6 10*3/uL (ref 3.4–10.8)

## 2019-10-06 LAB — LIPID PANEL
Chol/HDL Ratio: 3.7 ratio (ref 0.0–4.4)
Cholesterol, Total: 175 mg/dL (ref 100–199)
HDL: 47 mg/dL (ref >39)
LDL Chol Calc (NIH): 109 mg/dL — ABNORMAL HIGH (ref 0–99)
Triglycerides: 107 mg/dL (ref 0–149)
VLDL Cholesterol Cal: 19 mg/dL (ref 5–40)

## 2019-10-06 LAB — T4, FREE: Free T4: 1.14 ng/dL (ref 0.82–1.77)

## 2019-10-06 LAB — T3, FREE: T3, Free: 3.1 pg/mL (ref 2.0–4.4)

## 2019-10-06 LAB — VITAMIN B12: Vitamin B-12: 346 pg/mL (ref 232–1245)

## 2019-10-12 ENCOUNTER — Other Ambulatory Visit: Payer: Self-pay | Admitting: Internal Medicine

## 2019-10-12 DIAGNOSIS — R399 Unspecified symptoms and signs involving the genitourinary system: Secondary | ICD-10-CM

## 2019-10-12 DIAGNOSIS — R748 Abnormal levels of other serum enzymes: Secondary | ICD-10-CM

## 2019-10-12 NOTE — Progress Notes (Signed)
BMP and Alkaline phosphatase isoenzyme ordered

## 2019-10-17 ENCOUNTER — Other Ambulatory Visit: Payer: Self-pay | Admitting: Internal Medicine

## 2019-10-17 ENCOUNTER — Other Ambulatory Visit: Payer: Self-pay

## 2019-10-17 ENCOUNTER — Other Ambulatory Visit: Payer: 59

## 2019-10-19 LAB — BMP8+ANION GAP
Anion Gap: 13 mmol/L (ref 10.0–18.0)
BUN/Creatinine Ratio: 9 (ref 9–23)
BUN: 10 mg/dL (ref 6–24)
CO2: 27 mmol/L (ref 20–29)
Calcium: 9.7 mg/dL (ref 8.7–10.2)
Chloride: 99 mmol/L (ref 96–106)
Creatinine, Ser: 1.16 mg/dL — ABNORMAL HIGH (ref 0.57–1.00)
GFR calc Af Amer: 63 mL/min/{1.73_m2} (ref >59)
GFR calc non Af Amer: 55 mL/min/{1.73_m2} — ABNORMAL LOW (ref >59)
Glucose: 75 mg/dL (ref 65–99)
Potassium: 4.8 mmol/L (ref 3.5–5.2)
Sodium: 139 mmol/L (ref 134–144)

## 2019-10-19 LAB — ALKALINE PHOSPHATASE, ISOENZYMES
Alkaline Phosphatase: 147 IU/L — ABNORMAL HIGH (ref 39–117)
BONE FRACTION: 30 % (ref 14–68)
INTESTINAL FRAC.: 5 % (ref 0–18)
LIVER FRACTION: 65 % (ref 18–85)

## 2019-10-20 ENCOUNTER — Encounter: Payer: Self-pay | Admitting: Internal Medicine

## 2019-11-29 ENCOUNTER — Ambulatory Visit: Payer: 59 | Admitting: Adult Health

## 2020-01-10 ENCOUNTER — Other Ambulatory Visit: Payer: Self-pay | Admitting: Internal Medicine

## 2020-01-10 DIAGNOSIS — Z8349 Family history of other endocrine, nutritional and metabolic diseases: Secondary | ICD-10-CM

## 2020-01-30 ENCOUNTER — Other Ambulatory Visit: Payer: Self-pay | Admitting: Neurology

## 2020-02-01 ENCOUNTER — Other Ambulatory Visit: Payer: Self-pay | Admitting: Gastroenterology

## 2020-02-08 ENCOUNTER — Ambulatory Visit (INDEPENDENT_AMBULATORY_CARE_PROVIDER_SITE_OTHER): Payer: 59 | Admitting: Family Medicine

## 2020-02-08 ENCOUNTER — Other Ambulatory Visit: Payer: Self-pay

## 2020-02-08 ENCOUNTER — Encounter (INDEPENDENT_AMBULATORY_CARE_PROVIDER_SITE_OTHER): Payer: Self-pay | Admitting: Family Medicine

## 2020-02-08 VITALS — BP 140/82 | HR 71 | Temp 97.7°F | Ht 65.0 in | Wt 305.0 lb

## 2020-02-08 DIAGNOSIS — R7989 Other specified abnormal findings of blood chemistry: Secondary | ICD-10-CM

## 2020-02-08 DIAGNOSIS — Z0289 Encounter for other administrative examinations: Secondary | ICD-10-CM

## 2020-02-08 DIAGNOSIS — E559 Vitamin D deficiency, unspecified: Secondary | ICD-10-CM

## 2020-02-08 DIAGNOSIS — Z6841 Body Mass Index (BMI) 40.0 and over, adult: Secondary | ICD-10-CM

## 2020-02-08 DIAGNOSIS — E282 Polycystic ovarian syndrome: Secondary | ICD-10-CM

## 2020-02-08 DIAGNOSIS — R0602 Shortness of breath: Secondary | ICD-10-CM | POA: Diagnosis not present

## 2020-02-08 DIAGNOSIS — Z9189 Other specified personal risk factors, not elsewhere classified: Secondary | ICD-10-CM | POA: Diagnosis not present

## 2020-02-08 DIAGNOSIS — R1013 Epigastric pain: Secondary | ICD-10-CM

## 2020-02-08 DIAGNOSIS — Q019 Encephalocele, unspecified: Secondary | ICD-10-CM

## 2020-02-08 DIAGNOSIS — G4731 Primary central sleep apnea: Secondary | ICD-10-CM | POA: Diagnosis not present

## 2020-02-08 DIAGNOSIS — G4709 Other insomnia: Secondary | ICD-10-CM

## 2020-02-08 DIAGNOSIS — R5383 Other fatigue: Secondary | ICD-10-CM

## 2020-02-08 DIAGNOSIS — R600 Localized edema: Secondary | ICD-10-CM

## 2020-02-08 DIAGNOSIS — N809 Endometriosis, unspecified: Secondary | ICD-10-CM

## 2020-02-08 DIAGNOSIS — F3289 Other specified depressive episodes: Secondary | ICD-10-CM

## 2020-02-08 DIAGNOSIS — E662 Morbid (severe) obesity with alveolar hypoventilation: Secondary | ICD-10-CM | POA: Diagnosis not present

## 2020-02-08 DIAGNOSIS — E538 Deficiency of other specified B group vitamins: Secondary | ICD-10-CM

## 2020-02-08 DIAGNOSIS — G8929 Other chronic pain: Secondary | ICD-10-CM

## 2020-02-08 DIAGNOSIS — K581 Irritable bowel syndrome with constipation: Secondary | ICD-10-CM

## 2020-02-08 DIAGNOSIS — R7303 Prediabetes: Secondary | ICD-10-CM

## 2020-02-08 NOTE — Progress Notes (Signed)
Dear Shelby Mattocks, PA-C,   Thank you for referring Erin Jimenez to our clinic. The following note includes my evaluation and treatment recommendations.  Chief Complaint:   OBESITY [name] (MR# [id number]) is a 49 y.o. female who presents for evaluation and treatment of obesity and related comorbidities. Current BMI is Body mass index is 50.75 kg/m. Erin Jimenez has been struggling with her weight for many years and has been unsuccessful in either losing weight, maintaining weight loss, or reaching her healthy weight goal.  Erin Jimenez is currently in the action stage of change and ready to dedicate time achieving and maintaining a healthier weight. Erin Jimenez is interested in becoming our patient and working on intensive lifestyle modifications including (but not limited to) diet and exercise for weight loss.  [name] has history of lap band surgery in 2006.  She lost 80 pounds.  Also history of spinal fusion in her neck and now has chronic pain.  She drinks regular soda daily, but she says she is okay to stop.  Her highest weight was 320 lbs, today she is at 305 lbs.  Her goal is to be at 230 lbs in 1 year.  Erin Jimenez's habits were reviewed today and are as follows: Her family eats meals together, she thinks her family will eat healthier with her, her desired weight loss is 85 pounds, she has been heavy most of her life, she started gaining weight in 1999 after she stopped taking birth control, her heaviest weight ever was 319 pounds, she craves [place] food and sweets, she snacks frequently in the evenings, she wakes up sometimes in the middle of the night to eat, she skips lunch and sometimes dinner frequently, she is frequently drinking liquids with calories, she frequently makes poor food choices and she struggles with emotional eating.  Depression Screen [name] Food and Mood (modified PHQ-9) score was 15.  Depression screen [facility] 2/9 02/08/2020  Decreased Interest 2  Down, Depressed,  Hopeless 3  PHQ - 2 Score 5  Altered sleeping 3  Tired, decreased energy 2  Change in appetite 2  Feeling bad or failure about yourself  2  Trouble concentrating 1  Moving slowly or fidgety/restless 0  PHQ-9 Score 15  Difficult doing work/chores Very difficult   Subjective:   1. Other fatigue Erin Jimenez denies daytime somnolence and reports waking up still tired. Patent has a history of symptoms of morning fatigue and morning headache. Erin Jimenez generally gets 4 or 5 hours of sleep per night, and states that she has generally restful sleep. Snoring is present. Apneic episodes are not present (patient uses a CPAP). Epworth Sleepiness Score is 7.  2. SOB (shortness of breath) on exertion [name] notes increasing shortness of breath with exercising and seems to be worsening over time with weight gain. She notes getting out of breath sooner with activity than she used to. This has gotten worse recently. Erin Jimenez denies shortness of breath at rest or orthopnea.  3. Complex sleep apnea syndrome Erin Jimenez is followed by Dr. Brett Fairy.  Uses CPAP machine.  Situation Chance of Dozing or Sleeping  Sitting and reading 2 = moderate chance of dozing or sleeping  Watching television 1 = slight chance of dozing or sleeping  Sitting inactive in a public place (theater or meeting) 1 = slight chance of dozing or sleeping  Lying down in the afternoon when circumstances permit 0 = would never doze or sleep  Sitting as a passenger in a car for an hour 3 =  high chance of dozing or sleeping  Sitting and talking to someone 0 = would never doze or sleep  Sitting quietly after lunch without alcohol 0 = would never doze or sleep  In a car, while stopped for a few minutes in traffic 0 = would never doze or sleep  TOTAL 7   4. Hypoventilation associated with obesity syndrome ([place]) Followed by Neurology.  5. Irritable bowel syndrome with constipation Erin Jimenez is taking Linzess 145 mcg daily.  6. Prediabetes Erin Jimenez has a  diagnosis of prediabetes based on her elevated HgA1c and was informed this puts her at greater risk of developing diabetes. She continues to work on diet and exercise to decrease her risk of diabetes. She denies nausea or hypoglycemia.  Lab Results  Component Value Date   HGBA1C 5.9 (H) 10/05/2019   7. Vitamin B12 deficiency She is not a vegetarian.  She does not have a previous diagnosis of pernicious anemia.  She has a history of weight loss surgery.   Lab Results  Component Value Date   VITAMINB12 346 10/05/2019   8. Bilateral lower extremity edema Hathaway has edema in bilateral lower extremities.  9. Other insomnia Takai takes trazodone to for insomnia.  10. Dyspepsia Erin Jimenez takes Nexium for dyspepsia and is followed by Dr. Carlean Purl.  11. Arnold-Chiari malformation, type III (Erin Jimenez) Followed by Neurology.  12. Vitamin D deficiency She is currently taking vit D. She denies nausea, vomiting or muscle weakness.  13. Endometriosis Hx of multiple abdominal surgeries.   14. PCOS (polycystic ovarian syndrome) Erin Jimenez has the diagnosis of PCOS.  15. Other chronic pain Erin Jimenez is followed by Dr. Rita Ohara.  16. Elevated serum creatinine Lab Results  Component Value Date   CREATININE 1.16 (H) 10/17/2019   17. Other depression, with emotional eating Erin Jimenez is struggling with emotional eating and using food for comfort to the extent that it is negatively impacting her health. She has been working on behavior modification techniques to help reduce her emotional eating and has been unsuccessful. She shows no sign of suicidal or homicidal ideations.  She is taking Pristiq and Xanax.  PHQ is 15.  41. At risk for diabetes mellitus Erin Jimenez is at higher than average risk for developing diabetes due to her obesity.   Assessment/Plan:   1. Other fatigue Erin Jimenez does feel that her weight is causing her energy to be lower than it should be. Fatigue may be related to obesity, depression or many other  causes. Labs will be ordered, and in the meanwhile, Erin Jimenez will focus on self care including making healthy food choices, increasing physical activity and focusing on stress reduction.  Orders - EKG 12-Lead - TSH - T4, free - T3 - Anemia panel  2. SOB (shortness of breath) on exertion Ellyette does feel that she gets out of breath more easily that she used to when she exercises. Karma's shortness of breath appears to be obesity related and exercise induced. She has agreed to work on weight loss and gradually increase exercise to treat her exercise induced shortness of breath. Will continue to monitor closely.  3. Complex sleep apnea syndrome Intensive lifestyle modifications are the first line treatment for this issue. We discussed several lifestyle modifications today and she will continue to work on diet, exercise and weight loss efforts. We will continue to monitor. Orders and follow up as documented in patient record.   Counseling  Sleep apnea is a condition in which breathing pauses or becomes shallow during sleep. This happens over  and over during the night. This disrupts your sleep and keeps your body from getting the rest that it needs, which can cause tiredness and lack of energy (fatigue) during the day.  Sleep apnea treatment: If you were given a device to open your airway while you sleep, USE IT!  Sleep hygiene:   Limit or avoid alcohol, caffeinated beverages, and cigarettes, especially close to bedtime.   Do not eat a large meal or eat spicy foods right before bedtime. This can lead to digestive discomfort that can make it hard for you to sleep.  Keep a sleep diary to help you and your health care provider figure out what could be causing your insomnia.  . Make your bedroom a dark, comfortable place where it is easy to fall asleep. ? Put up shades or blackout curtains to block light from outside. ? Use a white noise machine to block noise. ? Keep the temperature cool. . Limit  screen use before bedtime. This includes: ? Watching TV. ? Using your smartphone, tablet, or computer. . Stick to a routine that includes going to bed and waking up at the same times every day and night. This can help you fall asleep faster. Consider making a quiet activity, such as reading, part of your nighttime routine. . Try to avoid taking naps during the day so that you sleep better at night. . Get out of bed if you are still awake after 15 minutes of trying to sleep. Keep the lights down, but try reading or doing a quiet activity. When you feel sleepy, go back to bed.  Orders - CBC with Differential/Platelet  4. Hypoventilation associated with obesity syndrome (HCC) Counseling: Intensive lifestyle modifications are the first line treatment for this issue. We discussed several lifestyle modifications today and she will continue to work on diet, exercise and weight loss efforts. We will continue to monitor.  5. Irritable bowel syndrome with constipation Controlled at this time.  6. Prediabetes Iza will continue to work on weight loss, exercise, and decreasing simple carbohydrates to help decrease the risk of diabetes.   Orders - Comprehensive metabolic panel - Hemoglobin A1c - Insulin, random  7. Vitamin B12 deficiency The diagnosis was reviewed with the patient. Counseling provided today, see below. We will continue to monitor. Orders and follow up as documented in patient record.  Counseling . The body needs vitamin B12: to make red blood cells; to make DNA; and to help the nerves work properly so they can carry messages from the brain to the body.  . The main causes of vitamin B12 deficiency include dietary deficiency, digestive diseases, pernicious anemia, and having a surgery in which part of the stomach or small intestine is removed.  . Certain medicines can make it harder for the body to absorb vitamin B12. These medicines include: heartburn medications; some antibiotics;  some medications used to treat diabetes, gout, and high cholesterol.  . In some cases, there are no symptoms of this condition. If the condition leads to anemia or nerve damage, various symptoms can occur, such as weakness or fatigue, shortness of breath, and numbness or tingling in your hands and feet.   . Treatment:  o May include taking vitamin B12 supplements.  o Avoid alcohol.  o Eat lots of healthy foods that contain vitamin B12: - Beef, pork, chicken, [place], and organ meats, such as liver.  - Seafood: This includes clams, rainbow trout, salmon, tuna, and haddock. Eggs.  - Cereal and dairy products that  are fortified: This means that vitamin B12 has been added to the food.   8. Bilateral lower extremity edema Recommend compression hose. - Lipid panel  9. Other insomnia The problem of recurrent insomnia was discussed. Orders and follow up as documented in patient record. Counseling: Intensive lifestyle modifications are the first line treatment for this issue. We discussed several lifestyle modifications today and she will continue to work on diet, exercise and weight loss efforts.   Counseling  Limit or avoid alcohol, caffeinated beverages, and cigarettes, especially close to bedtime.   Do not eat a large meal or eat spicy foods right before bedtime. This can lead to digestive discomfort that can make it hard for you to sleep.  Keep a sleep diary to help you and your health care provider figure out what could be causing your insomnia.  . Make your bedroom a dark, comfortable place where it is easy to fall asleep. ? Put up shades or blackout curtains to block light from outside. ? Use a white noise machine to block noise. ? Keep the temperature cool. . Limit screen use before bedtime. This includes: ? Watching TV. ? Using your smartphone, tablet, or computer. . Stick to a routine that includes going to bed and waking up at the same times every day and night. This can help you  fall asleep faster. Consider making a quiet activity, such as reading, part of your nighttime routine. . Try to avoid taking naps during the day so that you sleep better at night. . Get out of bed if you are still awake after 15 minutes of trying to sleep. Keep the lights down, but try reading or doing a quiet activity. When you feel sleepy, go back to bed.  10. Dyspepsia Current treatment plan is effective, no change in therapy. Orders and follow up as documented in patient record.  11. Arnold-Chiari malformation, type III ([place]) We will continue to monitor.  12. Vitamin D deficiency Low Vitamin D level contributes to fatigue and are associated with obesity, breast, and colon cancer. She agrees to continue to take prescription Vitamin D @50 ,000 IU every week and will follow-up for routine testing of Vitamin D, at least 2-3 times per year to avoid over-replacement. - VITAMIN D 25 Hydroxy (Vit-D Deficiency, Fractures)  13. Endometriosis We will continue to monitor.  14. PCOS (polycystic ovarian syndrome) Intensive lifestyle modifications are first line treatment for this issue. We discussed several lifestyle modifications today and she will continue to work on diet, exercise and weight loss efforts. Orders and follow up as documented in patient record.  Counseling . PCOS is a leading cause of menstrual irregularities and infertility. It is also associated with obesity, hirsutism (excessive hair growth on the face, chest, or back), and cardiovascular risk factors such as high cholesterol and insulin resistance. . Insulin resistance appears to play a central role.  . Women with PCOS have been shown to have impaired appetite-regulating hormones. . Metformin is one medication that can improve metabolic parameters.  . Women with polycystic ovary syndrome (PCOS) have an increased risk for cardiovascular disease (CVD) - European Journal of Preventive Cardiology.  15. Other chronic pain We will  continue to monitor. Orders and follow up as documented in patient record.  16. Elevated serum creatinine Will continue to monitor.  17. Other depression, with emotional eating Behavior modification techniques were discussed today to help Jeslie deal with her emotional/non-hunger eating behaviors.  Orders and follow up as documented in patient record.  21. At risk for diabetes mellitus Shaleta was given approximately 15 minutes of diabetes education and counseling today. We discussed intensive lifestyle modifications today with an emphasis on weight loss as well as increasing exercise and decreasing simple carbohydrates in her diet. We also reviewed medication options with an emphasis on risk versus benefit of those discussed.   Repetitive spaced learning was employed today to elicit superior memory formation and behavioral change.  19. Class 3 severe obesity with serious comorbidity and body mass index (BMI) of 50.0 to 59.9 in adult, unspecified obesity type [facility]) Yena is currently in the action stage of change and her goal is to continue with weight loss efforts. I recommend Nidhi begin the structured treatment plan as follows:  She has agreed to the Category 2 Plan.  Exercise goals: No exercise has been prescribed at this time.   Behavioral modification strategies: increasing lean protein intake, decreasing simple carbohydrates, increasing vegetables, increasing water intake, decreasing liquid calories and increasing high fiber foods.  She was informed of the importance of frequent follow-up visits to maximize her success with intensive lifestyle modifications for her multiple health conditions. She was informed we would discuss her lab results at her next visit unless there is a critical issue that needs to be addressed sooner. Jahayra agreed to keep her next visit at the agreed upon time to discuss these results.  Objective:   Blood pressure 140/82, pulse 71, temperature 97.7 F (36.5  C), temperature source Oral, height 5\' 5"  (1.651 m), weight (!) 305 lb (138.3 kg), SpO2 99 %. Body mass index is 50.75 kg/m.  EKG: Normal sinus rhythm, rate 74 bpm.  Indirect Calorimeter completed today shows a VO2 of 257 and a REE of 1790.  Her calculated basal metabolic rate is [id number] thus her basal metabolic rate is worse than expected.  General: Cooperative, alert, well developed, in no acute distress. HEENT: Conjunctivae and lids unremarkable. Cardiovascular: Regular rhythm.  Lungs: Normal work of breathing. Neurologic: No focal deficits.   Lab Results  Component Value Date   CREATININE 1.16 (H) 10/17/2019   BUN 10 10/17/2019   NA 139 10/17/2019   K 4.8 10/17/2019   CL 99 10/17/2019   CO2 27 10/17/2019   Lab Results  Component Value Date   ALT 21 10/05/2019   AST 20 10/05/2019   ALKPHOS 147 (H) 10/17/2019   BILITOT 0.4 10/05/2019   Lab Results  Component Value Date   HGBA1C 5.9 (H) 10/05/2019   HGBA1C 5.8 (H) 07/20/2015   HGBA1C 5.2 05/24/2013   HGBA1C 4.8 02/10/2012   HGBA1C 4.9 01/14/2011   No results found for: INSULIN Lab Results  Component Value Date   TSH 1.620 10/05/2019   Lab Results  Component Value Date   CHOL 175 10/05/2019   HDL 47 10/05/2019   LDLCALC 109 (H) 10/05/2019   TRIG 107 10/05/2019   CHOLHDL 3.7 10/05/2019   Lab Results  Component Value Date   WBC 7.6 10/05/2019   HGB 11.9 10/05/2019   HCT 37.7 10/05/2019   MCV 80 10/05/2019   PLT 292 10/05/2019   Lab Results  Component Value Date   IRON 64 02/14/2019   TIBC 412 02/14/2019   FERRITIN 15 02/14/2019   Attestation Statements:   This is the patient's first visit at [facility]. The patient's NEW PATIENT PACKET was reviewed at length. Included in the packet: current and past health history, medications, allergies, ROS, gynecologic history (women only), surgical history, family history, social  history, weight history, weight loss surgery history (for those that  have had weight loss surgery), nutritional evaluation, mood and food questionnaire, PHQ9, Epworth questionnaire, sleep habits questionnaire, patient life and health improvement goals questionnaire. These will all be scanned into the patient's chart under media.   During the visit, I independently reviewed the patient's EKG, bioimpedance scale results, and indirect calorimeter results. I used this information to tailor a meal plan for the patient that will help her to lose weight and will improve her obesity-related conditions going forward. I performed a medically necessary appropriate examination and/or evaluation. I discussed the assessment and treatment plan with the patient. The patient was provided an opportunity to ask questions and all were answered. The patient agreed with the plan and demonstrated an understanding of the instructions. Labs were ordered at this visit and will be reviewed at the next visit unless more critical results need to be addressed immediately. Clinical information was updated and documented in the EMR.   I, Water quality scientist, CMA, am acting as Location manager for [employer], DO.  I have reviewed the above documentation for accuracy and completeness, and I agree with the above. Briscoe Deutscher, DO

## 2020-02-10 LAB — COMPREHENSIVE METABOLIC PANEL
ALT: 17 IU/L (ref 0–32)
AST: 14 IU/L (ref 0–40)
Albumin/Globulin Ratio: 1.5 (ref 1.2–2.2)
Albumin: 4.1 g/dL (ref 3.8–4.8)
Alkaline Phosphatase: 127 IU/L — ABNORMAL HIGH (ref 39–117)
BUN/Creatinine Ratio: 6 — ABNORMAL LOW (ref 9–23)
BUN: 7 mg/dL (ref 6–24)
Bilirubin Total: 0.3 mg/dL (ref 0.0–1.2)
CO2: 24 mmol/L (ref 20–29)
Calcium: 9.2 mg/dL (ref 8.7–10.2)
Chloride: 102 mmol/L (ref 96–106)
Creatinine, Ser: 1.08 mg/dL — ABNORMAL HIGH (ref 0.57–1.00)
GFR calc Af Amer: 69 mL/min/{1.73_m2} (ref >59)
GFR calc non Af Amer: 60 mL/min/{1.73_m2} (ref >59)
Globulin, Total: 2.7 g/dL (ref 1.5–4.5)
Glucose: 77 mg/dL (ref 65–99)
Potassium: 4.4 mmol/L (ref 3.5–5.2)
Sodium: 141 mmol/L (ref 134–144)
Total Protein: 6.8 g/dL (ref 6.0–8.5)

## 2020-02-10 LAB — ANEMIA PANEL
Ferritin: 14 ng/mL — ABNORMAL LOW (ref 15–150)
Folate, Hemolysate: 405 ng/mL
Folate, RBC: 1089 ng/mL (ref >498)
Hematocrit: 37.2 % (ref 34.0–46.6)
Iron Saturation: 12 % — ABNORMAL LOW (ref 15–55)
Iron: 47 ug/dL (ref 27–159)
Retic Ct Pct: 1.4 % (ref 0.6–2.6)
Total Iron Binding Capacity: 390 ug/dL (ref 250–450)
UIBC: 343 ug/dL (ref 131–425)
Vitamin B-12: 259 pg/mL (ref 232–1245)

## 2020-02-10 LAB — LIPID PANEL
Chol/HDL Ratio: 4 ratio (ref 0.0–4.4)
Cholesterol, Total: 175 mg/dL (ref 100–199)
HDL: 44 mg/dL (ref >39)
LDL Chol Calc (NIH): 106 mg/dL — ABNORMAL HIGH (ref 0–99)
Triglycerides: 139 mg/dL (ref 0–149)
VLDL Cholesterol Cal: 25 mg/dL (ref 5–40)

## 2020-02-10 LAB — T4, FREE: Free T4: 1.09 ng/dL (ref 0.82–1.77)

## 2020-02-10 LAB — CBC WITH DIFFERENTIAL/PLATELET
Basophils Absolute: 0.1 10*3/uL (ref 0.0–0.2)
Basos: 1 %
EOS (ABSOLUTE): 0.2 10*3/uL (ref 0.0–0.4)
Eos: 2 %
Hemoglobin: 12 g/dL (ref 11.1–15.9)
Immature Grans (Abs): 0 10*3/uL (ref 0.0–0.1)
Immature Granulocytes: 0 %
Lymphocytes Absolute: 2.3 10*3/uL (ref 0.7–3.1)
Lymphs: 36 %
MCH: 25.6 pg — ABNORMAL LOW (ref 26.6–33.0)
MCHC: 32.3 g/dL (ref 31.5–35.7)
MCV: 79 fL (ref 79–97)
Monocytes Absolute: 0.6 10*3/uL (ref 0.1–0.9)
Monocytes: 9 %
Neutrophils Absolute: 3.3 10*3/uL (ref 1.4–7.0)
Neutrophils: 52 %
Platelets: 332 10*3/uL (ref 150–450)
RBC: 4.69 x10E6/uL (ref 3.77–5.28)
RDW: 16 % — ABNORMAL HIGH (ref 11.7–15.4)
WBC: 6.4 10*3/uL (ref 3.4–10.8)

## 2020-02-10 LAB — VITAMIN D 25 HYDROXY (VIT D DEFICIENCY, FRACTURES): Vit D, 25-Hydroxy: 40.9 ng/mL (ref 30.0–100.0)

## 2020-02-10 LAB — INSULIN, RANDOM: INSULIN: 28.7 u[IU]/mL — ABNORMAL HIGH (ref 2.6–24.9)

## 2020-02-10 LAB — T3: T3, Total: 105 ng/dL (ref 71–180)

## 2020-02-10 LAB — HEMOGLOBIN A1C
Est. average glucose Bld gHb Est-mCnc: 120 mg/dL
Hgb A1c MFr Bld: 5.8 % — ABNORMAL HIGH (ref 4.8–5.6)

## 2020-02-10 LAB — TSH: TSH: 2.65 u[IU]/mL (ref 0.450–4.500)

## 2020-02-20 ENCOUNTER — Encounter: Payer: Self-pay | Admitting: Neurology

## 2020-02-22 ENCOUNTER — Encounter (INDEPENDENT_AMBULATORY_CARE_PROVIDER_SITE_OTHER): Payer: Self-pay | Admitting: Family Medicine

## 2020-02-22 ENCOUNTER — Other Ambulatory Visit: Payer: Self-pay

## 2020-02-22 ENCOUNTER — Ambulatory Visit (INDEPENDENT_AMBULATORY_CARE_PROVIDER_SITE_OTHER): Payer: 59 | Admitting: Family Medicine

## 2020-02-22 VITALS — BP 150/72 | HR 87 | Temp 97.9°F | Ht 65.0 in | Wt 301.0 lb

## 2020-02-22 DIAGNOSIS — R790 Abnormal level of blood mineral: Secondary | ICD-10-CM | POA: Diagnosis not present

## 2020-02-22 DIAGNOSIS — R7303 Prediabetes: Secondary | ICD-10-CM

## 2020-02-22 DIAGNOSIS — R7989 Other specified abnormal findings of blood chemistry: Secondary | ICD-10-CM

## 2020-02-22 DIAGNOSIS — Z9884 Bariatric surgery status: Secondary | ICD-10-CM

## 2020-02-22 DIAGNOSIS — E559 Vitamin D deficiency, unspecified: Secondary | ICD-10-CM

## 2020-02-22 DIAGNOSIS — R030 Elevated blood-pressure reading, without diagnosis of hypertension: Secondary | ICD-10-CM | POA: Diagnosis not present

## 2020-02-22 DIAGNOSIS — K5909 Other constipation: Secondary | ICD-10-CM

## 2020-02-22 DIAGNOSIS — Z6841 Body Mass Index (BMI) 40.0 and over, adult: Secondary | ICD-10-CM

## 2020-02-22 MED ORDER — METFORMIN HCL 500 MG PO TABS: 250.0000 mg | ORAL_TABLET | Freq: Every day | ORAL | 0 refills | Status: DC

## 2020-02-22 MED ORDER — FERROUS SULFATE ER 50 MG PO TBCR: 1.0000 | EXTENDED_RELEASE_TABLET | ORAL | 0 refills | Status: DC

## 2020-02-22 NOTE — Progress Notes (Signed)
Chief Complaint:   OBESITY Erin Jimenez is here to discuss her progress with her obesity treatment plan along with follow-up of her obesity related diagnoses. Erin Jimenez is on the Category 2 Plan and states she is following her eating plan approximately 90-95% of the time. Erin Jimenez states she is exercising for 0 minutes 0 times per week.  Today's visit was #: 2 Starting weight: 305 lbs Starting date: 02/08/2020 Today's weight: 301 lbs Today's date: 02/22/2020 Total lbs lost to date: 4 lbs Total lbs lost since last in-office visit: 4 lbs  Interim History: Erin Jimenez denies hunger or cravings.  She reports that she "cheated" by going to [facility] and having popcorn shrimp and a potato.    She provides the following food recall today: Breakfast:  Yogurt/fruit. Lunch:  Cheese stick/4 ouces deli meat. Dinner:  Chicken (4-6 ounces) plus veg. Snack:  Yasso bars, 100 calorie snack pack.   Fluids: She is drinking more Gatorade Zero.  Subjective:   1. Elevated blood pressure reading Review: taking medications as instructed, no medication side effects noted, no chest pain on exertion, no dyspnea on exertion, no swelling of ankles.  Blood pressure elevated today and yesterday, per patient.  She is asymptomatic.   BP Readings from Last 3 Encounters:  02/22/20 (!) 150/72  02/08/20 140/82  10/05/19 124/76   2. Low ferritin Jadah is not a vegetarian.  She has a history of weight loss surgery.  She has low ferritin along with low iron saturation, low MCV and a low hemoglobin of 12.  CBC Latest Ref Rng & Units 02/08/2020 10/05/2019 02/14/2019  WBC 3.4 - 10.8 x10E3/uL 6.4 7.6 10.8  Hemoglobin 11.1 - 15.9 g/dL 12.0 11.9 12.5  Hematocrit 34.0 - 46.6 % 37.2 37.7 38.5  Platelets 150 - 450 x10E3/uL 332 292 360   Lab Results  Component Value Date   IRON 47 02/08/2020   TIBC 390 02/08/2020   FERRITIN 14 (L) 02/08/2020   3. Prediabetes Erin Jimenez has a diagnosis of prediabetes based on her elevated HgA1c and was  informed this puts her at greater risk of developing diabetes. She continues to work on diet and exercise to decrease her risk of diabetes. She denies nausea or hypoglycemia.    Lab Results  Component Value Date   HGBA1C 5.8 (H) 02/08/2020   Lab Results  Component Value Date   INSULIN 28.7 (H) 02/08/2020   4. Elevated serum creatinine Erin Jimenez has an elevated creatinine along with a normal GFR, but her last several creatinines have been elevated.  Lab Results  Component Value Date   CREATININE 1.08 (H) 02/08/2020   CREATININE 1.16 (H) 10/17/2019   CREATININE 1.09 (H) 10/05/2019   5. Vitamin D deficiency Erin Jimenez's Vitamin D level was 40.9 on 02/08/2020. She is currently taking vit D 4000 IU daily. She denies nausea, vomiting or muscle weakness.  6. History of laparoscopic adjustable gastric banding Erin Jimenez had lap banding in 1996.  She reports vomiting if she tries to increase her protein intake.  7. Chronic constipation Erin Jimenez has chronic constipation and is taking Linzess.  Assessment/Plan:   1. Elevated blood pressure reading This problem is new. Counseling: Intensive lifestyle modifications are the first line treatment for this issue. We discussed several lifestyle modifications today and she will continue to work on diet, exercise and weight loss efforts. We will continue to monitor. Orders and follow up as documented in patient record.  2. Low ferritin Will start patient on slow release iron every other day,  as below.  Orders - Ferrous Sulfate 50 MG TBCR; Take 1 tablet by mouth every other day.  Dispense: 15 tablet; Refill: 0  3. Prediabetes Lucyanna will continue to work on weight loss, exercise, and decreasing simple carbohydrates to help decrease the risk of diabetes.   Orders - metFORMIN (GLUCOPHAGE) 500 MG tablet; Take 0.5 tablets (250 mg total) by mouth daily.  Dispense: 30 tablet; Refill: 0  4. Elevated serum creatinine Lab results and trends reviewed. We discussed  several lifestyle modifications today and she will continue to work on diet, exercise and weight loss efforts. Avoid nephrotoxic medications. Orders and follow up as documented in patient record.   Counseling . Chronic kidney disease (CKD) happens when the kidneys are damaged over a long period of time. . Most of the time, this condition does not go away, but it can usually be controlled. Steps must be taken to slow down the kidney damage or to stop it from getting worse. . Intensive lifestyle modifications are the first line treatment for this issue.  Marland Kitchen Avoid buying foods that are: processed, frozen, or prepackaged to avoid excess salt.   5. Vitamin D deficiency Low Vitamin D level contributes to fatigue and are associated with obesity, breast, and colon cancer. She agrees to continue to take Vitamin D @4000  IU daily and will follow-up for routine testing of Vitamin D, at least 2-3 times per year to avoid over-replacement.  6. History of laparoscopic adjustable gastric banding Erin Jimenez is at risk for malnutrition due to her previous bariatric surgery.   Counseling  You may need to eat 3 meals and 2 snacks, or 5 small meals each day in order to reach your protein and calorie goals.   Allow at least 15 minutes for each meal so that you can eat mindfully. Listen to your body so that you do not overeat.   Eat foods from all food groups. This includes fruits and vegetables, grains, dairy, and meat and other proteins.  Include a protein-rich food at every meal and snack, and eat the protein food first.   7. Chronic constipation Currently controlled. Will monitor with addition of iron and metformin.  8. Class 3 severe obesity with serious comorbidity and body mass index (BMI) of 50.0 to 59.9 in adult, unspecified obesity type [facility]) Erin Jimenez is currently in the action stage of change. As such, her goal is to continue with weight loss efforts. She has agreed to the Category 2 Plan.   Exercise goals:  Active job.  Behavioral modification strategies: increasing lean protein intake, increasing water intake and meal planning and cooking strategies.  Jalaiya has agreed to follow-up with our clinic in 2 weeks. She was informed of the importance of frequent follow-up visits to maximize her success with intensive lifestyle modifications for her multiple health conditions.   Objective:   Blood pressure (!) 150/72, pulse 87, temperature 97.9 F (36.6 C), temperature source Oral, height 5\' 5"  (1.651 m), weight (!) 301 lb (136.5 kg), SpO2 100 %. Body mass index is 50.09 kg/m.  General: Cooperative, alert, well developed, in no acute distress. HEENT: Conjunctivae and lids unremarkable. Cardiovascular: Regular rhythm.  Lungs: Normal work of breathing. Neurologic: No focal deficits.   Lab Results  Component Value Date   CREATININE 1.08 (H) 02/08/2020   BUN 7 02/08/2020   NA 141 02/08/2020   K 4.4 02/08/2020   CL 102 02/08/2020   CO2 24 02/08/2020   Lab Results  Component Value Date   ALT 17  02/08/2020   AST 14 02/08/2020   ALKPHOS 127 (H) 02/08/2020   BILITOT 0.3 02/08/2020   Lab Results  Component Value Date   HGBA1C 5.8 (H) 02/08/2020   HGBA1C 5.9 (H) 10/05/2019   HGBA1C 5.8 (H) 07/20/2015   HGBA1C 5.2 05/24/2013   HGBA1C 4.8 02/10/2012   Lab Results  Component Value Date   INSULIN 28.7 (H) 02/08/2020   Lab Results  Component Value Date   TSH 2.650 02/08/2020   Lab Results  Component Value Date   CHOL 175 02/08/2020   HDL 44 02/08/2020   LDLCALC 106 (H) 02/08/2020   TRIG 139 02/08/2020   CHOLHDL 4.0 02/08/2020   Lab Results  Component Value Date   WBC 6.4 02/08/2020   HGB 12.0 02/08/2020   HCT 37.2 02/08/2020   MCV 79 02/08/2020   PLT 332 02/08/2020   Lab Results  Component Value Date   IRON 47 02/08/2020   TIBC 390 02/08/2020   FERRITIN 14 (L) 02/08/2020   Attestation Statements:   Reviewed by clinician on day of visit: allergies, medications, problem  list, medical history, surgical history, family history, social history, and previous encounter notes.  I, Water quality scientist, CMA, am acting as Location manager for [employer], DO.  I have reviewed the above documentation for accuracy and completeness, and I agree with the above. Briscoe Deutscher, DO

## 2020-03-19 ENCOUNTER — Ambulatory Visit (INDEPENDENT_AMBULATORY_CARE_PROVIDER_SITE_OTHER): Payer: 59 | Admitting: Family Medicine

## 2020-03-19 ENCOUNTER — Other Ambulatory Visit: Payer: Self-pay

## 2020-03-19 ENCOUNTER — Encounter (INDEPENDENT_AMBULATORY_CARE_PROVIDER_SITE_OTHER): Payer: Self-pay | Admitting: Family Medicine

## 2020-03-19 VITALS — BP 129/68 | HR 60 | Temp 98.5°F | Ht 65.0 in | Wt 298.0 lb

## 2020-03-19 DIAGNOSIS — K5909 Other constipation: Secondary | ICD-10-CM

## 2020-03-19 DIAGNOSIS — Z9189 Other specified personal risk factors, not elsewhere classified: Secondary | ICD-10-CM

## 2020-03-19 DIAGNOSIS — Z9884 Bariatric surgery status: Secondary | ICD-10-CM | POA: Diagnosis not present

## 2020-03-19 DIAGNOSIS — R790 Abnormal level of blood mineral: Secondary | ICD-10-CM

## 2020-03-19 DIAGNOSIS — R7303 Prediabetes: Secondary | ICD-10-CM | POA: Diagnosis not present

## 2020-03-19 DIAGNOSIS — Z6841 Body Mass Index (BMI) 40.0 and over, adult: Secondary | ICD-10-CM

## 2020-03-19 NOTE — Progress Notes (Signed)
Chief Complaint:   OBESITY Erin Jimenez is here to discuss her progress with her obesity treatment plan along with follow-up of her obesity related diagnoses. Erin Jimenez is on the Category 2 Plan and states she is following her eating plan approximately 90-95% of the time. Erin Jimenez states she is exercising for 0 minutes 0 times per week.  Today's visit was #: 3 Starting weight: 305 lbs Starting date: 02/08/2020 Today's weight: 298 lbs Today's date: 03/19/2020 Total lbs lost to date: 7 lbs Total lbs lost since last in-office visit: 3 lbs  Interim History: Erin Jimenez reports that she walks a lot at work and is thinking about getting a watch to monitor her steps.  She says she is drinking 100 ounces of water per day.  Subjective:   1. Prediabetes Erin Jimenez has a diagnosis of prediabetes based on her elevated HgA1c and was informed this puts her at greater risk of developing diabetes. She continues to work on diet and exercise to decrease her risk of diabetes. She denies nausea or hypoglycemia.  Metformin 250 mg daily is working well.  She denies polyphagia or cravings.  Lab Results  Component Value Date   HGBA1C 5.8 (H) 02/08/2020   Lab Results  Component Value Date   INSULIN 28.7 (H) 02/08/2020   2. History of laparoscopic adjustable gastric banding She is intolerant of high protein meals.  She had this surgery in 1996.  3. Chronic constipation Aarini takes Linzess for chronic constipation.  4. Low ferritin Vanissa did not tolerate the iron pills due to constipation and abdominal cramps.  We discussed iron infusion.  Assessment/Plan:   1. Prediabetes Erin Jimenez will continue to work on weight loss, exercise, and decreasing simple carbohydrates to help decrease the risk of diabetes.   2. History of laparoscopic adjustable gastric banding Will monitor GI intolerance.  3. Chronic constipation Current treatment plan is effective, no change in therapy. Orders and follow up as documented in patient  record.  4. Low ferritin Will see if we an transfer to new PCP first.  Patient requests transfer because current PCP is only available 1 day per week. Goal will be for iron infusions.  5. At risk for diabetes mellitus Erin Jimenez was given approximately 15 minutes of diabetes education and counseling today. We discussed intensive lifestyle modifications today with an emphasis on weight loss as well as increasing exercise and decreasing simple carbohydrates in her diet. We also reviewed medication options with an emphasis on risk versus benefit of those discussed.   Repetitive spaced learning was employed today to elicit superior memory formation and behavioral change.  6. Class 3 severe obesity with serious comorbidity and body mass index (BMI) of 45.0 to 49.9 in adult, unspecified obesity type [facility]) Erin Jimenez is currently in the action stage of change. As such, her goal is to continue with weight loss efforts. She has agreed to the Category 2 Plan.   Exercise goals: As is - NEAT.  Behavioral modification strategies: planning for success. Think about self-care goals.  Erin Jimenez has agreed to follow-up with our clinic in 2 weeks. She was informed of the importance of frequent follow-up visits to maximize her success with intensive lifestyle modifications for her multiple health conditions.   Objective:   Blood pressure 129/68, pulse 60, temperature 98.5 F (36.9 C), temperature source Oral, height 5\' 5"  (1.651 m), weight 298 lb (135.2 kg), SpO2 100 %. Body mass index is 49.59 kg/m.  General: Cooperative, alert, well developed, in no acute distress. HEENT: Conjunctivae  and lids unremarkable. Cardiovascular: Regular rhythm.  Lungs: Normal work of breathing. Neurologic: No focal deficits.   Lab Results  Component Value Date   CREATININE 1.08 (H) 02/08/2020   BUN 7 02/08/2020   NA 141 02/08/2020   K 4.4 02/08/2020   CL 102 02/08/2020   CO2 24 02/08/2020   Lab Results  Component Value Date    ALT 17 02/08/2020   AST 14 02/08/2020   ALKPHOS 127 (H) 02/08/2020   BILITOT 0.3 02/08/2020   Lab Results  Component Value Date   HGBA1C 5.8 (H) 02/08/2020   HGBA1C 5.9 (H) 10/05/2019   HGBA1C 5.8 (H) 07/20/2015   HGBA1C 5.2 05/24/2013   HGBA1C 4.8 02/10/2012   Lab Results  Component Value Date   INSULIN 28.7 (H) 02/08/2020   Lab Results  Component Value Date   TSH 2.650 02/08/2020   Lab Results  Component Value Date   CHOL 175 02/08/2020   HDL 44 02/08/2020   LDLCALC 106 (H) 02/08/2020   TRIG 139 02/08/2020   CHOLHDL 4.0 02/08/2020   Lab Results  Component Value Date   WBC 6.4 02/08/2020   HGB 12.0 02/08/2020   HCT 37.2 02/08/2020   MCV 79 02/08/2020   PLT 332 02/08/2020   Lab Results  Component Value Date   IRON 47 02/08/2020   TIBC 390 02/08/2020   FERRITIN 14 (L) 02/08/2020   Attestation Statements:   Reviewed by clinician on day of visit: allergies, medications, problem list, medical history, surgical history, family history, social history, and previous encounter notes.  I, Water quality scientist, CMA, am acting as Location manager for [employer], DO.  I have reviewed the above documentation for accuracy and completeness, and I agree with the above. Erin Deutscher, DO

## 2020-03-25 ENCOUNTER — Encounter (INDEPENDENT_AMBULATORY_CARE_PROVIDER_SITE_OTHER): Payer: Self-pay | Admitting: Family Medicine

## 2020-03-25 ENCOUNTER — Ambulatory Visit (INDEPENDENT_AMBULATORY_CARE_PROVIDER_SITE_OTHER): Payer: 59 | Admitting: Adult Health

## 2020-03-25 ENCOUNTER — Other Ambulatory Visit: Payer: Self-pay

## 2020-03-25 ENCOUNTER — Other Ambulatory Visit (INDEPENDENT_AMBULATORY_CARE_PROVIDER_SITE_OTHER): Payer: Self-pay

## 2020-03-25 ENCOUNTER — Encounter: Payer: Self-pay | Admitting: Adult Health

## 2020-03-25 VITALS — Temp 97.2°F | Ht 66.5 in | Wt 304.2 lb

## 2020-03-25 DIAGNOSIS — R790 Abnormal level of blood mineral: Secondary | ICD-10-CM

## 2020-03-25 DIAGNOSIS — Z9989 Dependence on other enabling machines and devices: Secondary | ICD-10-CM

## 2020-03-25 DIAGNOSIS — G4733 Obstructive sleep apnea (adult) (pediatric): Secondary | ICD-10-CM

## 2020-03-25 MED ORDER — TRAZODONE HCL 50 MG PO TABS: 50.0000 mg | ORAL_TABLET | Freq: Every evening | ORAL | 3 refills | Status: DC | PRN

## 2020-03-25 NOTE — Patient Instructions (Signed)
Continue using CPAP nightly and greater than 4 hours each night CPAP titration with transition to bipap If your symptoms worsen or you develop new symptoms please let us know.

## 2020-03-25 NOTE — Progress Notes (Signed)
PATIENT: Erin Jimenez DOB: 11-03-1971  REASON FOR VISIT: follow up HISTORY FROM: patient  HISTORY OF PRESENT ILLNESS: Today 03/25/20: Erin Jimenez is a 49 year old female with a history of obstructive sleep apnea on CPAP.  Her download indicates that she used her machine 26 out of 30 days for compliance of 87%.  She used her machine greater than 4 hours 13 days for compliance of 43%.  On average she uses her machine 3 hours and 35 minutes.  Her residual AHI is 13.7 on 8 to 18.6 cm of water.  Her leak in the 95th percentile is 4.  Her central apnea is 9.1 and obstructive is 3.6.  She reports that night she feels that she is not getting enough air.  There are nights that she is unable to use the CPAP for this reason.  She continues on trazodone 50 mg at bedtime.    HISTORY (Copied from Dr.Dohmeier's note) Erin Jimenez is a 49 y.o. female  and is seen here for follow up on CPAP compliance. This is our first virtual video Visit. Today is the 06-05-2019. Erin Jimenez has been a very compliant CPAP user she is using her air sense 10 AutoSet for 29 out of 30 days but only reached 18 of those days over 4 hours of daily use.  Average user time has been around 5 hours nightly.  The current setting is to what it changed last a minimum pressure of 8 cmH2O maximum pressure of 17 cmH2O and 3 cm EPR.  She has very little air leakage the 95th percentile air leak is 8.5 L a minute.  She loves her nasal pillow the air fit P 10.  Her residual AHI has been 10.9/h.  It rose by almost 2 apneas per hour since our last visit.  Remarkable is that this remains an almost evenly distributed residual between central and obstructive events.  Obstructive apneas are 4.8/h central sleep apnea 4.7/h.  Since there is not much air leak there is also not room for erroneous unspecified apneas.  Since the 95th percentile pressure is up almost 16 cmH2O I wondered if I should increase her maximum pressure to 18 or even 18.6 from the current 17.   I hope thereby to eliminate the residual obstructive component but there is a risk of inducing more central apnea.  I explained this to the patient who feels that her CPAP is serving her well she endorsed the Epworth Sleepiness Scale at only 5 out of 24 points and she always uses her CPAP.  She is also familiar with BiPAP as her father now deceased used to have one.  She is sleeping so much better on her current meds and is often asleep before the CPAP is in place she explained to me sometimes she would go at 2 or 3 in the morning to the bathroom and then just use the CPAP for the second remainder of the night.  I would neither change her medication nor her nasal pillow interface but I think it is worthwhile to put a little bit more pressure into the CPAP.  I asked for a download of compliance data 30 days from now.    REVIEW OF SYSTEMS: Out of a complete 14 system review of symptoms, the patient complains only of the following symptoms, and all other reviewed systems are negative.  ESS 3  ALLERGIES: Allergies  Allergen Reactions  . Ciprofloxacin Nausea Only  . Doxycycline Nausea Only  . Oxycodone Nausea  Only  . Sulfa Antibiotics Nausea Only    HOME MEDICATIONS: Outpatient Medications Prior to Visit  Medication Sig Dispense Refill  . ALPRAZolam (XANAX) 0.5 MG tablet TAKE 1/2 TABLET (0.25 MG TOTAL) BY MOUTH AT BEDTIME AS NEEDED FOR ANXIETY. 45 tablet 5  . cholecalciferol (VITAMIN D) 1000 UNITS tablet Take 4,000 Units by mouth daily.     . cyanocobalamin (,VITAMIN B-12,) 1000 MCG/ML injection INJECT 1 ML (1,000 MCG TOTAL) INTO THE MUSCLE EVERY 30 (THIRTY) DAYS. 3 mL 1  . cyclobenzaprine (FLEXERIL) 10 MG tablet Take 10 mg by mouth at bedtime.    Marland Kitchen Desvenlafaxine ER (PRISTIQ) 50 MG TB24 TAKE 1 TABLET BY MOUTH EVERY DAY 90 tablet 2  . esomeprazole (NEXIUM) 40 MG capsule Take 1 capsule (40 mg total) by mouth daily before breakfast. 90 capsule 0  . Est Estrogens-Methyltest (ESTRATEST PO) Take  by mouth. Takes 1.5 tab daily.    . Ferrous Sulfate 50 MG TBCR Take 1 tablet by mouth every other day. 15 tablet 0  . HYDROCODONE-ACETAMINOPHEN PO Take by mouth. 5-325MG  TABS, TAKES 1.5 TAB QHS    . LINZESS 145 MCG CAPS capsule TAKE 1 CAPSULE BY MOUTH EVERY DAY BEFORE BREAKFAST 90 capsule 3  . metFORMIN (GLUCOPHAGE) 500 MG tablet Take 0.5 tablets (250 mg total) by mouth daily. 30 tablet 0  . traZODone (DESYREL) 50 MG tablet TAKE 1 TABLET BY MOUTH AT BEDTIME AS NEEDED FOR SLEEP 90 tablet 1  . fluticasone (FLONASE) 50 MCG/ACT nasal spray Place 2 sprays into the nose daily. Taking as needed     No facility-administered medications prior to visit.    PAST MEDICAL HISTORY: Past Medical History:  Diagnosis Date  . B12 deficiency   . Chiari malformation   . Chronic headache   . Constipation   . Depression   . Endometriosis   . Female infertility   . GERD (gastroesophageal reflux disease)   . Insomnia   . Irritable bowel syndrome   . Lower extremity edema   . Obesity   . PCOS (polycystic ovarian syndrome)   . Prediabetes   . Pseudomeningocele, acquired   . Sleep apnea   . Swallowing difficulty     PAST SURGICAL HISTORY: Past Surgical History:  Procedure Laterality Date  . ABDOMINAL HYSTERECTOMY  2001   due to endometriosis  . BRAIN SURGERY     Chiari malformation  . CHOLECYSTECTOMY    . ERCP  2000  . LAPAROSCOPIC GASTRIC BANDING  2006  . SPINAL FUSION      FAMILY HISTORY: Family History  Problem Relation Age of Onset  . Colon polyps Mother   . Diabetes Mother   . Brain cancer Mother   . Depression Mother   . Colon polyps Father   . Non-Hodgkin's lymphoma Father   . Bladder Cancer Father   . Sleep apnea Father   . Colon cancer Neg Hx   . Rectal cancer Neg Hx   . Stomach cancer Neg Hx     SOCIAL HISTORY: Social History   Socioeconomic History  . Marital status: Married    Spouse name: Darcey Deer  . Number of children: 0  . Years of education: Not on file   . Highest education level: Not on file  Occupational History  . Occupation: Scientist, research (life sciences): [employer]  Tobacco Use  . Smoking status: Never Smoker  . Smokeless tobacco: Never Used  Substance and Sexual Activity  . Alcohol use: Yes  Comment: occ  . Drug use: No  . Sexual activity: Yes    Birth control/protection: None  Other Topics Concern  . 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        Social Determinants of Health   Financial Resource Strain:   . Difficulty of Paying Living Expenses:   Food Insecurity:   . Worried About Charity fundraiser in the Last Year:   . Arboriculturist in the Last Year:   Transportation Needs:   . Film/video editor (Medical):   Marland Kitchen Lack of Transportation (Non-Medical):   Physical Activity:   . Days of Exercise per Week:   . Minutes of Exercise per Session:   Stress:   . Feeling of Stress :   Social Connections:   . Frequency of Communication with Friends and Family:   . Frequency of Social Gatherings with Friends and Family:   . Attends Religious Services:   . Active Member of Clubs or Organizations:   . Attends Archivist Meetings:   Marland Kitchen Marital Status:   Intimate Partner Violence:   . Fear of Current or Ex-Partner:   . Emotionally Abused:   Marland Kitchen Physically Abused:   . Sexually Abused:       PHYSICAL EXAM  Vitals:   03/25/20 0830  Temp: (!) 97.2 F (36.2 C)  Weight: (!) 304 lb 3.2 oz (138 kg)  Height: 5' 6.5" (1.689 m)   Body mass index is 48.36 kg/m.  Generalized: Well developed, in no acute distress  Chest: Lungs clear to auscultation bilaterally  Neurological examination  Mentation: Alert oriented to time, place, history taking. Follows all commands speech and language fluent Cranial nerve II-XII: Extraocular movements were full, visual field were full on confrontational test Head turning and  shoulder shrug  were normal and symmetric. Motor: The motor testing reveals 5 over 5 strength of all 4 extremities. Good symmetric motor tone is noted throughout.  Sensory: Sensory testing is intact to soft touch on all 4 extremities. No evidence of extinction is noted.  Gait and station: Gait is normal.    DIAGNOSTIC DATA (LABS, IMAGING, TESTING) - I reviewed patient records, labs, notes, testing and imaging myself where available.  Lab Results  Component Value Date   WBC 6.4 02/08/2020   HGB 12.0 02/08/2020   HCT 37.2 02/08/2020   MCV 79 02/08/2020   PLT 332 02/08/2020      Component Value Date/Time   NA 141 02/08/2020 1239   K 4.4 02/08/2020 1239   CL 102 02/08/2020 1239   CO2 24 02/08/2020 1239   GLUCOSE 77 02/08/2020 1239   GLUCOSE 78 07/20/2015 0915   BUN 7 02/08/2020 1239   CREATININE 1.08 (H) 02/08/2020 1239   CREATININE 1.03 07/20/2015 0915   CALCIUM 9.2 02/08/2020 1239   PROT 6.8 02/08/2020 1239   ALBUMIN 4.1 02/08/2020 1239   AST 14 02/08/2020 1239   ALT 17 02/08/2020 1239   ALKPHOS 127 (H) 02/08/2020 1239   BILITOT 0.3 02/08/2020 1239   GFRNONAA 60 02/08/2020 1239   GFRNONAA 65 07/20/2015 0915   GFRAA 69 02/08/2020 1239   GFRAA 75 07/20/2015 0915   Lab Results  Component Value Date   CHOL 175 02/08/2020   HDL 44 02/08/2020   LDLCALC 106 (H) 02/08/2020   TRIG 139 02/08/2020   CHOLHDL 4.0 02/08/2020   Lab  Results  Component Value Date   HGBA1C 5.8 (H) 02/08/2020   Lab Results  Component Value Date   VITAMINB12 259 02/08/2020   Lab Results  Component Value Date   TSH 2.650 02/08/2020      ASSESSMENT AND PLAN 49 y.o. year old female  has a past medical history of B12 deficiency, Chiari malformation, Chronic headache, Constipation, Depression, Endometriosis, Female infertility, GERD (gastroesophageal reflux disease), Insomnia, Irritable bowel syndrome, Lower extremity edema, Obesity, PCOS (polycystic ovarian syndrome), Prediabetes,  Pseudomeningocele, acquired, Sleep apnea, and Swallowing difficulty. here with:  1. OSA on CPAP  - CPAP compliance is suboptimal -Apnea remains elevated.  CPAP titration with transition to BiPAP ordered. - Encourage patient to use CPAP nightly and > 4 hours each night - F/U after CPAP titration   I spent 20 minutes of face-to-face and non-face-to-face time with patient.  This included previsit chart review, lab review, study review, order entry, electronic health record documentation, patient education.  Ward Givens, MSN, NP-C 03/25/2020, 8:36 AM [facility] [address], [place], [place] [address] [phone]

## 2020-03-26 ENCOUNTER — Other Ambulatory Visit: Payer: Self-pay | Admitting: Neurology

## 2020-03-26 DIAGNOSIS — G4731 Primary central sleep apnea: Secondary | ICD-10-CM

## 2020-03-26 DIAGNOSIS — G4733 Obstructive sleep apnea (adult) (pediatric): Secondary | ICD-10-CM

## 2020-03-26 DIAGNOSIS — Z9989 Dependence on other enabling machines and devices: Secondary | ICD-10-CM

## 2020-03-31 ENCOUNTER — Other Ambulatory Visit: Payer: Self-pay | Admitting: Gastroenterology

## 2020-04-04 ENCOUNTER — Ambulatory Visit: Payer: 59 | Admitting: Internal Medicine

## 2020-04-10 ENCOUNTER — Encounter (INDEPENDENT_AMBULATORY_CARE_PROVIDER_SITE_OTHER): Payer: Self-pay | Admitting: Family Medicine

## 2020-04-10 ENCOUNTER — Ambulatory Visit (INDEPENDENT_AMBULATORY_CARE_PROVIDER_SITE_OTHER): Payer: 59 | Admitting: Family Medicine

## 2020-04-10 ENCOUNTER — Other Ambulatory Visit: Payer: Self-pay

## 2020-04-10 VITALS — BP 136/73 | HR 72 | Temp 98.1°F | Ht 65.0 in | Wt 303.0 lb

## 2020-04-10 DIAGNOSIS — K581 Irritable bowel syndrome with constipation: Secondary | ICD-10-CM | POA: Diagnosis not present

## 2020-04-10 DIAGNOSIS — Z9884 Bariatric surgery status: Secondary | ICD-10-CM | POA: Diagnosis not present

## 2020-04-10 DIAGNOSIS — G4731 Primary central sleep apnea: Secondary | ICD-10-CM

## 2020-04-10 DIAGNOSIS — Z9189 Other specified personal risk factors, not elsewhere classified: Secondary | ICD-10-CM

## 2020-04-10 DIAGNOSIS — Z6841 Body Mass Index (BMI) 40.0 and over, adult: Secondary | ICD-10-CM

## 2020-04-10 DIAGNOSIS — R790 Abnormal level of blood mineral: Secondary | ICD-10-CM

## 2020-04-10 DIAGNOSIS — R7303 Prediabetes: Secondary | ICD-10-CM | POA: Diagnosis not present

## 2020-04-10 NOTE — Progress Notes (Signed)
Chief Complaint:   OBESITY Erin Jimenez is here to discuss her progress with her obesity treatment plan along with follow-up of her obesity related diagnoses. Erin Jimenez is on the Category 2 Plan and states she is following her eating plan approximately 80% of the time. Erin Jimenez states she is walking at work (4000-5000 steps).  Today's visit was #: 4 Starting weight: 305 lbs Starting date: 02/08/2020 Today's weight: 301 lbs Today's date: 04/10/2020 Total lbs lost to date: 4 lbs Total lbs lost since last in-office visit: 0  Interim History: Erin Jimenez wants more energy.  Her ferritin level is low and she would like to see Hematology.  She is still happy with the meal plan overall.  Denies polyphagia.  She says that work is busy and stressful.  Subjective:   1. Irritable bowel syndrome with constipation Stable.  Controlled with current medications.  2. Prediabetes Symora has a diagnosis of prediabetes based on her elevated HgA1c and was informed this puts her at greater risk of developing diabetes. She continues to work on diet and exercise to decrease her risk of diabetes. She denies nausea or hypoglycemia.  Lab Results  Component Value Date   HGBA1C 5.8 (H) 02/08/2020   Lab Results  Component Value Date   INSULIN 28.7 (H) 02/08/2020   3. Complex sleep apnea syndrome Erin Jimenez is followed by Neurology.  She is scheduled for a follow-up with them soon.  4. History of laparoscopic adjustable gastric banding Erin Jimenez had lap band surgery in 1996.  She is intolerant of high protein meals.  5. Low ferritin She will be establishing care with a new PCP, Erin Coke, PA, on 05/15/2020. Patient would like to go ahead and be referred to Hematology for evaluation and potentional infusions.  Assessment/Plan:   1. Irritable bowel syndrome with constipation Continue current medications.  2. Prediabetes Brucie will continue to work on weight loss, exercise, and decreasing simple carbohydrates to help  decrease the risk of diabetes.   3. Complex sleep apnea syndrome Followed by Neurology for this problem. Those encounter notes were reviewed. Orders and follow up as documented in patient record.  4. History of laparoscopic adjustable gastric banding Will continue to monitor.  5. Low ferritin Will refer Erin Jimenez to Hematology for low ferritin.  Orders - Ambulatory referral to Hematology  6. At risk for heart disease Erin Jimenez was given approximately 15 minutes of coronary artery disease prevention counseling today. She is 49 y.o. female and has risk factors for heart disease including obesity. We discussed intensive lifestyle modifications today with an emphasis on specific weight loss instructions and strategies.   Repetitive spaced learning was employed today to elicit superior memory formation and behavioral change.  7. Class 3 severe obesity with serious comorbidity and body mass index (BMI) of 50.0 to 59.9 in adult, unspecified obesity type [facility]) Erin Jimenez is currently in the action stage of change. As such, her goal is to continue with weight loss efforts. She has agreed to the Category 2 Plan.   Exercise goals: As is.  Behavioral modification strategies: increasing lean protein intake and increasing water intake.  Erin Jimenez has agreed to follow-up with our clinic in 3 weeks. She was informed of the importance of frequent follow-up visits to maximize her success with intensive lifestyle modifications for her multiple health conditions.   Objective:   Blood pressure 136/73, pulse 72, temperature 98.1 F (36.7 C), temperature source Oral, height 5\' 5"  (1.651 m), weight (!) 303 lb (137.4 kg), SpO2 98 %. Body  mass index is 50.42 kg/m.  General: Cooperative, alert, well developed, in no acute distress. HEENT: Conjunctivae and lids unremarkable. Cardiovascular: Regular rhythm.  Lungs: Normal work of breathing. Neurologic: No focal deficits.   Lab Results  Component Value Date   CREATININE  1.08 (H) 02/08/2020   BUN 7 02/08/2020   NA 141 02/08/2020   K 4.4 02/08/2020   CL 102 02/08/2020   CO2 24 02/08/2020   Lab Results  Component Value Date   ALT 17 02/08/2020   AST 14 02/08/2020   ALKPHOS 127 (H) 02/08/2020   BILITOT 0.3 02/08/2020   Lab Results  Component Value Date   HGBA1C 5.8 (H) 02/08/2020   HGBA1C 5.9 (H) 10/05/2019   HGBA1C 5.8 (H) 07/20/2015   HGBA1C 5.2 05/24/2013   HGBA1C 4.8 02/10/2012   Lab Results  Component Value Date   INSULIN 28.7 (H) 02/08/2020   Lab Results  Component Value Date   TSH 2.650 02/08/2020   Lab Results  Component Value Date   CHOL 175 02/08/2020   HDL 44 02/08/2020   LDLCALC 106 (H) 02/08/2020   TRIG 139 02/08/2020   CHOLHDL 4.0 02/08/2020   Lab Results  Component Value Date   WBC 6.4 02/08/2020   HGB 12.0 02/08/2020   HCT 37.2 02/08/2020   MCV 79 02/08/2020   PLT 332 02/08/2020   Lab Results  Component Value Date   IRON 47 02/08/2020   TIBC 390 02/08/2020   FERRITIN 14 (L) 02/08/2020   Attestation Statements:   Reviewed by clinician on day of visit: allergies, medications, problem list, medical history, surgical history, family history, social history, and previous encounter notes.  I, Water quality scientist, CMA, am acting as Location manager for [employer], DO.  I have reviewed the above documentation for accuracy and completeness, and I agree with the above. Briscoe Deutscher, DO

## 2020-04-11 ENCOUNTER — Ambulatory Visit: Payer: 59 | Admitting: Internal Medicine

## 2020-04-16 ENCOUNTER — Telehealth: Payer: Self-pay | Admitting: Nurse Practitioner

## 2020-04-16 ENCOUNTER — Telehealth: Payer: Self-pay | Admitting: Hematology

## 2020-04-16 NOTE — Telephone Encounter (Signed)
Received a new hem referral from Dr. Juleen China for low ferritin. Erin Jimenez has been cld and scheduled to see Dr. Burr Medico on 5/6 at 9am w/labs at 830am. Pt aware to arrive 15 minutes early.

## 2020-04-16 NOTE — Telephone Encounter (Signed)
Ms. Erin Jimenez called back to reschedule her hem appt to 5/12 at 845am to see Lacie w/labs at 8:15am.

## 2020-04-18 ENCOUNTER — Encounter: Payer: 59 | Admitting: Hematology

## 2020-04-18 ENCOUNTER — Other Ambulatory Visit: Payer: 59

## 2020-04-22 ENCOUNTER — Other Ambulatory Visit (INDEPENDENT_AMBULATORY_CARE_PROVIDER_SITE_OTHER): Payer: Self-pay | Admitting: Family Medicine

## 2020-04-22 ENCOUNTER — Encounter (INDEPENDENT_AMBULATORY_CARE_PROVIDER_SITE_OTHER): Payer: Self-pay | Admitting: Family Medicine

## 2020-04-22 DIAGNOSIS — R7303 Prediabetes: Secondary | ICD-10-CM

## 2020-04-23 ENCOUNTER — Other Ambulatory Visit: Payer: Self-pay | Admitting: *Deleted

## 2020-04-23 DIAGNOSIS — E538 Deficiency of other specified B group vitamins: Secondary | ICD-10-CM

## 2020-04-23 NOTE — Progress Notes (Addendum)
[facility]  [phone] Fax:([phone]) [place] consult Note   Patient Care Team: Rodriguez-Southworth, Sunday Spillers, PA-C as PCP - General (Internal Medicine) Comer, Okey Regal, MD as Consulting Physician (Infectious Diseases) Meisinger, Sherren Mocha, MD as Attending Physician (Obstetrics and Gynecology) Jovita Gamma, MD (Neurosurgery) Dohmeier, Asencion Partridge, MD as Consulting Physician (Neurology) 04/24/2020  CHIEF COMPLAINTS/PURPOSE OF CONSULTATION:  Low Ferritin, referred by Briscoe Deutscher, DO.   HISTORY OF PRESENTING ILLNESS:  Erin Jimenez 49 y.o. female with past medical history of IBS, obesity, B12 deficiency, OSA on CPAP, GERD, endometriosis occipital neuralgia, and arnold-chiari malformation is here because of low ferritin of 14 noted on anemia panel labs from 02/08/20. She was found to have normal TIBC 390, serum iron 47, and low transferrin saturation of 12%. Work up showed a normal hemoglobin 12.0, normal folate and B12 of 259. On 02/14/2019 she had a normal anemia panel except ferritin of 15 which is low end of normal. She has never been officially diagnosed with anemia but thinks she had low iron when she had menstrual periods. LMP 2001 with hysterectomy. She has had multiple surgeries for endometriosis. Also had lap band surgery in 2006. Her last colonoscopy was 03/15/2013 by Dr. Carlean Purl which was normal, 10 year recall. She denies recent chest pain on exertion, shortness of breath on minimal exertion, pre-syncopal episodes, or palpitations. She had not noticed any recent bleeding such as epistaxis, hematuria or hematochezia. She takes NSAIDs for headache and neck pain most days. She is not on antiplatelets agents. She had no prior history or diagnosis of cancer. Her age appropriate screening programs are up-to-date. She endorses pica with ice, eats a variety of diet she is not vegetarian. She never donated blood but may have received blood transfusion during brain surgery  for chiari malformation (Dr. Rita Ohara). The patient was prescribed oral iron and took ferrous sulfate 1 tab daily for only a week, stopped due to severe constipation which she has at baseline, on linzess for IBS.   Socially, she lives with her husband. No children. She works in Press photographer for [employer]. She is independent with ADLs, she is functional. Has insurance. Drinks alcohol socially. Denies tobacco or drug use. Her father had NHL. Denies family history of other blood disorder.   Today she presents to clinic by herself. She has normal appetite. Energy is very low at baseline. She goes to work but comes home and "i'm done." She tried iron for a week and had no BM, she has constipation at baseline with 2-3 BM per week. Had a BM last night with enema. Denies black or bloody stools. Denies other bleeding. Denies recent infection, cough, chest pain, dyspnea. She has chronic head and neck pain from chiari malformation.   MEDICAL HISTORY:  Past Medical History:  Diagnosis Date  . B12 deficiency   . Chiari malformation   . Chronic headache   . Constipation   . Depression   . Endometriosis   . Female infertility   . GERD (gastroesophageal reflux disease)   . Insomnia   . Irritable bowel syndrome   . Lower extremity edema   . Obesity   . PCOS (polycystic ovarian syndrome)   . Prediabetes   . Pseudomeningocele, acquired   . Sleep apnea   . Swallowing difficulty     SURGICAL HISTORY: Past Surgical History:  Procedure Laterality Date  . ABDOMINAL HYSTERECTOMY  2001   due to endometriosis  . BRAIN SURGERY     Chiari malformation  .  CHOLECYSTECTOMY    . ERCP  2000  . LAPAROSCOPIC GASTRIC BANDING  2006  . SPINAL FUSION      SOCIAL HISTORY: Social History   Socioeconomic History  . Marital status: Married    Spouse name: Erin Jimenez  . Number of children: 0  . Years of education: Not on file  . Highest education level: Not on file  Occupational History  . Occupation:  Scientist, research (life sciences): [employer]  Tobacco Use  . Smoking status: Never Smoker  . Smokeless tobacco: Never Used  Substance and Sexual Activity  . Alcohol use: Yes    Comment: occ  . Drug use: No  . Sexual activity: Yes    Birth control/protection: None  Other Topics Concern  . 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        Social Determinants of Health   Financial Resource Strain:   . Difficulty of Paying Living Expenses:   Food Insecurity:   . Worried About Charity fundraiser in the Last Year:   . Arboriculturist in the Last Year:   Transportation Needs:   . Film/video editor (Medical):   Marland Kitchen Lack of Transportation (Non-Medical):   Physical Activity:   . Days of Exercise per Week:   . Minutes of Exercise per Session:   Stress:   . Feeling of Stress :   Social Connections:   . Frequency of Communication with Friends and Family:   . Frequency of Social Gatherings with Friends and Family:   . Attends Religious Services:   . Active Member of Clubs or Organizations:   . Attends Archivist Meetings:   Marland Kitchen Marital Status:   Intimate Partner Violence:   . Fear of Current or Ex-Partner:   . Emotionally Abused:   Marland Kitchen Physically Abused:   . Sexually Abused:     FAMILY HISTORY: Family History  Problem Relation Age of Onset  . Colon polyps Mother   . Diabetes Mother   . Brain cancer Mother   . Depression Mother   . Colon polyps Father   . Non-Hodgkin's lymphoma Father   . Bladder Cancer Father   . Sleep apnea Father   . Colon cancer Neg Hx   . Rectal cancer Neg Hx   . Stomach cancer Neg Hx     ALLERGIES:  is allergic to ciprofloxacin; doxycycline; other; oxycodone; and sulfa antibiotics.  MEDICATIONS:  Current Outpatient Medications  Medication Sig Dispense Refill  . ALPRAZolam (XANAX) 0.5 MG tablet TAKE 1/2 TABLET (0.25 MG TOTAL) BY MOUTH AT  BEDTIME AS NEEDED FOR ANXIETY. 45 tablet 5  . cholecalciferol (VITAMIN D) 1000 UNITS tablet Take 4,000 Units by mouth daily.     . cyanocobalamin (,VITAMIN B-12,) 1000 MCG/ML injection INJECT 1 ML (1,000 MCG TOTAL) INTO THE MUSCLE EVERY 30 (THIRTY) DAYS. 3 mL 1  . cyclobenzaprine (FLEXERIL) 10 MG tablet Take 10 mg by mouth at bedtime.    Marland Kitchen Desvenlafaxine ER (PRISTIQ) 50 MG TB24 TAKE 1 TABLET BY MOUTH EVERY DAY 90 tablet 2  . esomeprazole (NEXIUM) 40 MG capsule Take 1 capsule (40 mg total) by mouth daily before breakfast. 90 capsule 0  . Est Estrogens-Methyltest (ESTRATEST PO) Take by mouth. Takes 1.5 tab daily.    Marland Kitchen HYDROCODONE-ACETAMINOPHEN PO Take by mouth. 5-325MG  TABS, TAKES 1.5 TAB QHS    .  linaclotide (LINZESS) 145 MCG CAPS capsule Take 1 capsule (145 mcg total) by mouth daily before breakfast. Please schedule a yearly follow up for any further refills 30 capsule 1  . metFORMIN (GLUCOPHAGE) 500 MG tablet TAKE 1/2 TAB BY MOUTH DAILY 15 tablet 0  . traZODone (DESYREL) 50 MG tablet Take 1 tablet (50 mg total) by mouth at bedtime as needed. for sleep 90 tablet 3  . fluticasone (FLONASE) 50 MCG/ACT nasal spray Place 2 sprays into the nose daily. Taking as needed     No current facility-administered medications for this visit.    REVIEW OF SYSTEMS:   Constitutional: Denies fevers, chills or abnormal night sweats (+) fatigue  Eyes: Denies blurriness of vision, double vision or watery eyes Ears, nose, mouth, throat, and face: Denies mucositis or sore throat Respiratory: Denies cough, dyspnea or wheezes Cardiovascular: Denies palpitation, chest discomfort or lower extremity swelling Gastrointestinal:  Denies nausea, vomiting, diarrhea, bleeding, heartburn or change in bowel habits (+) Constipation (+) IBS  Skin: Denies abnormal skin rashes Lymphatics: Denies new lymphadenopathy or easy bruising Neurological:Denies numbness, tingling or new weaknesses (+) headache (+) neck pain (+) chiari  malformation  Behavioral/Psych: Mood is stable, no new changes  All other systems were reviewed with the patient and are negative.  PHYSICAL EXAMINATION: ECOG PERFORMANCE STATUS: 1 - Symptomatic but completely ambulatory  Vitals:   04/24/20 0851  BP: 119/66  Pulse: 78  Resp: 18  Temp: 98.3 F (36.8 C)  SpO2: 98%   Filed Weights   04/24/20 0851  Weight: (!) 306 lb 4.8 oz (138.9 kg)    GENERAL:alert, no distress and comfortable SKIN: no obvious rash  EYES: sclera clear NECK: without mass LUNGS: clear with normal breathing effort HEART: regular rate & rhythm, mild equal lower extremity edema ABDOMEN: obese abdomen soft, non-tender and normal bowel sounds PSYCH: alert & oriented x 3 with fluent speech NEURO: no focal motor/sensory deficits. Normal gait   LABORATORY DATA:  I have reviewed the data as listed CBC Latest Ref Rng & Units 04/24/2020 02/08/2020 10/05/2019  WBC 4.0 - 10.5 K/uL 8.1 6.4 7.6  Hemoglobin 12.0 - 15.0 g/dL 11.3(L) 12.0 11.9  Hematocrit 36.0 - 46.0 % 36.9 37.2 37.7  Platelets 150 - 400 K/uL 293 332 292     RADIOGRAPHIC STUDIES: I have personally reviewed the radiological images as listed and agreed with the findings in the report. No results found.  ASSESSMENT & PLAN: 49 yo female   1. Mild iron deficiency  -We reviewed her records in detail with the patient. She has mildly low ferritin without anemia since 2020. She has h/o lap band surgery and has IBS -We reviewed the etiology of iron deficiency, including low absorption, bleeding, malignancy. She does not have obvious bleeding, no menses.  -Last colonoscopy in 2014 was normal, she is on 10 year recall. We are recommending a stool card to r/o occult bleeding. She is reportedly UTD on other age-appropriate cancer screenings. -Due to her prior weight loss surgery she likely has low iron absorption. We are checking celiac panel which is pending.  -She tried oral ferrous sulfate x1 week but had worsening  constipation.  -she has significant fatigue, but remains functional because she has to. Her fatigue is multifactorial, likely related to obesity and OSA.  -Her repeat labs today show Hgb 11.3, serum iron 33, TIBC 443, 8% transferrin saturation, overall consistent with mild iron deficiency anemia.  -We reviewed various oral iron formulations vs IV iron. Due to her  mild IDA and the risk of allergic reaction with IV iron, we are recommending to try other oral iron formulation once daily for at least 3 months.  -She is followed closely [facility]en [facility], who can check CBC/iron panel q6-9 weeks to monitor her response to oral iron. If Ms. Sinquefield is not responding or not tolerating oral iron we will see her back to discuss IV iron.  -If she responds and tolerates oral iron, we will see her back in 6-9 months.  -We also discussed taking a pre-natal vitamin if she does not find a tolerable iron supplement, and taking it with orange juice/vitamin C to promote absorption.  -Ms. Grimaldo agrees with the plan. I will cc the note to Briscoe Deutscher, DO  2. B12 deficiency -diagnosed in the past, patient gets 1000 mcg injection monthly at home -B12 level on 02/08/20: 259  3. Obesity, OSA -Likely contributing to her fatigue -On CPAP -h/o gastric lap band 2006 -weight managed by Dr. Emilio Aspen  -on Metformin for pre-DM   PLAN: -Lab today (CBC, iron/TIBC, ferritin, celiac panel, stool card) -Restart oral iron formuation, take with vitamin C for absorption -F/u with Dr. Randolm Idol, CBC/iron studies q6-9 weeks to monitor response -If tolerating and responding well to oral iron, return for lab/follow up in 6-9 months, or sooner if needed for IV iron (if not responding or poorly tolerating oral iron)   Orders Placed This Encounter  Procedures  . Celiac panel 10    Standing Status:   Future    Number of Occurrences:   1    Standing Expiration Date:   04/24/2021  . Occult blood card  to lab, stool    Standing Status:   Future    Standing Expiration Date:   04/24/2021    All questions were answered. The patient knows to call the clinic with any problems, questions or concerns.     Alla Feeling, NP 04/24/20   Addendum  I have seen the patient, examined her. I agree with the assessment and and plan and have edited the notes.   Ms Starns has mild iron deficiency without anemia (except mild anemia today), also has history of B12 deficiency. Previous GI endoscopy were negative for source of bleeding.  This is possible related to her previous gastric banding surgery, will rule out celiac disease, and check stool OB.  Given the overall mild iron deficiency, I encouraged her to try different formula oral iron, including liquid iron first.  She did not tolerate ferrous sulfate well.  If still could not tolerate, or does not respond well, will consider IV iron.  We discussed potential allergy reaction from IV iron.  She agrees with the plan, will follow up with PCP for lab monitoring.   Truitt Merle  04/24/2020

## 2020-04-24 ENCOUNTER — Inpatient Hospital Stay: Payer: 59

## 2020-04-24 ENCOUNTER — Other Ambulatory Visit: Payer: Self-pay

## 2020-04-24 ENCOUNTER — Encounter: Payer: 59 | Admitting: Adult Health

## 2020-04-24 ENCOUNTER — Encounter: Payer: Self-pay | Admitting: Nurse Practitioner

## 2020-04-24 ENCOUNTER — Inpatient Hospital Stay: Payer: 59 | Attending: Hematology | Admitting: Nurse Practitioner

## 2020-04-24 VITALS — BP 119/66 | HR 78 | Temp 98.3°F | Resp 18 | Ht 65.0 in | Wt 306.3 lb

## 2020-04-24 DIAGNOSIS — Z818 Family history of other mental and behavioral disorders: Secondary | ICD-10-CM | POA: Insufficient documentation

## 2020-04-24 DIAGNOSIS — R790 Abnormal level of blood mineral: Secondary | ICD-10-CM

## 2020-04-24 DIAGNOSIS — Z885 Allergy status to narcotic agent status: Secondary | ICD-10-CM | POA: Diagnosis not present

## 2020-04-24 DIAGNOSIS — Z808 Family history of malignant neoplasm of other organs or systems: Secondary | ICD-10-CM | POA: Diagnosis not present

## 2020-04-24 DIAGNOSIS — Z90710 Acquired absence of both cervix and uterus: Secondary | ICD-10-CM | POA: Insufficient documentation

## 2020-04-24 DIAGNOSIS — Z881 Allergy status to other antibiotic agents status: Secondary | ICD-10-CM | POA: Diagnosis not present

## 2020-04-24 DIAGNOSIS — R519 Headache, unspecified: Secondary | ICD-10-CM

## 2020-04-24 DIAGNOSIS — Z833 Family history of diabetes mellitus: Secondary | ICD-10-CM | POA: Insufficient documentation

## 2020-04-24 DIAGNOSIS — K219 Gastro-esophageal reflux disease without esophagitis: Secondary | ICD-10-CM | POA: Diagnosis not present

## 2020-04-24 DIAGNOSIS — Z79899 Other long term (current) drug therapy: Secondary | ICD-10-CM | POA: Insufficient documentation

## 2020-04-24 DIAGNOSIS — E538 Deficiency of other specified B group vitamins: Secondary | ICD-10-CM | POA: Diagnosis not present

## 2020-04-24 DIAGNOSIS — E119 Type 2 diabetes mellitus without complications: Secondary | ICD-10-CM | POA: Diagnosis not present

## 2020-04-24 DIAGNOSIS — G4700 Insomnia, unspecified: Secondary | ICD-10-CM | POA: Diagnosis not present

## 2020-04-24 DIAGNOSIS — K589 Irritable bowel syndrome without diarrhea: Secondary | ICD-10-CM | POA: Insufficient documentation

## 2020-04-24 DIAGNOSIS — E611 Iron deficiency: Secondary | ICD-10-CM | POA: Insufficient documentation

## 2020-04-24 DIAGNOSIS — Z9884 Bariatric surgery status: Secondary | ICD-10-CM | POA: Diagnosis not present

## 2020-04-24 DIAGNOSIS — G4733 Obstructive sleep apnea (adult) (pediatric): Secondary | ICD-10-CM | POA: Diagnosis not present

## 2020-04-24 DIAGNOSIS — Z882 Allergy status to sulfonamides status: Secondary | ICD-10-CM | POA: Insufficient documentation

## 2020-04-24 DIAGNOSIS — Z7984 Long term (current) use of oral hypoglycemic drugs: Secondary | ICD-10-CM | POA: Diagnosis not present

## 2020-04-24 DIAGNOSIS — Q0700 Arnold-Chiari syndrome without spina bifida or hydrocephalus: Secondary | ICD-10-CM | POA: Insufficient documentation

## 2020-04-24 DIAGNOSIS — N808 Other endometriosis: Secondary | ICD-10-CM | POA: Diagnosis not present

## 2020-04-24 DIAGNOSIS — E669 Obesity, unspecified: Secondary | ICD-10-CM | POA: Diagnosis not present

## 2020-04-24 DIAGNOSIS — Z8371 Family history of colonic polyps: Secondary | ICD-10-CM

## 2020-04-24 DIAGNOSIS — M542 Cervicalgia: Secondary | ICD-10-CM | POA: Diagnosis not present

## 2020-04-24 DIAGNOSIS — R5383 Other fatigue: Secondary | ICD-10-CM | POA: Diagnosis not present

## 2020-04-24 DIAGNOSIS — Z836 Family history of other diseases of the respiratory system: Secondary | ICD-10-CM

## 2020-04-24 LAB — CBC WITH DIFFERENTIAL (CANCER CENTER ONLY)
Abs Immature Granulocytes: 0.02 10*3/uL (ref 0.00–0.07)
Basophils Absolute: 0 10*3/uL (ref 0.0–0.1)
Basophils Relative: 0 %
Eosinophils Absolute: 0.5 10*3/uL (ref 0.0–0.5)
Eosinophils Relative: 6 %
HCT: 36.9 % (ref 36.0–46.0)
Hemoglobin: 11.3 g/dL — ABNORMAL LOW (ref 12.0–15.0)
Immature Granulocytes: 0 %
Lymphocytes Relative: 26 %
Lymphs Abs: 2.1 10*3/uL (ref 0.7–4.0)
MCH: 24.6 pg — ABNORMAL LOW (ref 26.0–34.0)
MCHC: 30.6 g/dL (ref 30.0–36.0)
MCV: 80.2 fL (ref 80.0–100.0)
Monocytes Absolute: 0.6 10*3/uL (ref 0.1–1.0)
Monocytes Relative: 8 %
Neutro Abs: 4.8 10*3/uL (ref 1.7–7.7)
Neutrophils Relative %: 60 %
Platelet Count: 293 10*3/uL (ref 150–400)
RBC: 4.6 MIL/uL (ref 3.87–5.11)
RDW: 16 % — ABNORMAL HIGH (ref 11.5–15.5)
WBC Count: 8.1 10*3/uL (ref 4.0–10.5)
nRBC: 0 % (ref 0.0–0.2)

## 2020-04-24 LAB — IRON AND TIBC
Iron: 33 ug/dL — ABNORMAL LOW (ref 41–142)
Saturation Ratios: 8 % — ABNORMAL LOW (ref 21–57)
TIBC: 443 ug/dL (ref 236–444)
UIBC: 409 ug/dL — ABNORMAL HIGH (ref 120–384)

## 2020-04-24 LAB — FERRITIN: Ferritin: 12 ng/mL (ref 11–307)

## 2020-04-25 ENCOUNTER — Telehealth: Payer: Self-pay | Admitting: Nurse Practitioner

## 2020-04-25 NOTE — Telephone Encounter (Signed)
Scheduled appt per 5/12 los.  Spoke with pt and she stated to go ahead and schedule the appt and she will check my-chart, and if she needed it rescheduled she will call us.

## 2020-04-26 LAB — CELIAC PANEL 10
Antigliadin Abs, IgA: 4 units (ref 0–19)
Endomysial Ab, IgA: NEGATIVE
Gliadin IgG: 91 units — ABNORMAL HIGH (ref 0–19)
IgA: 202 mg/dL (ref 87–352)
Tissue Transglut Ab: 2 U/mL (ref 0–5)
Tissue Transglutaminase Ab, IgA: <2 U/mL (ref 0–3)

## 2020-05-02 ENCOUNTER — Encounter: Payer: Self-pay | Admitting: Internal Medicine

## 2020-05-06 ENCOUNTER — Encounter (INDEPENDENT_AMBULATORY_CARE_PROVIDER_SITE_OTHER): Payer: Self-pay | Admitting: Family Medicine

## 2020-05-06 ENCOUNTER — Telehealth: Payer: Self-pay

## 2020-05-06 ENCOUNTER — Other Ambulatory Visit: Payer: Self-pay

## 2020-05-06 ENCOUNTER — Ambulatory Visit (INDEPENDENT_AMBULATORY_CARE_PROVIDER_SITE_OTHER): Payer: 59 | Admitting: Family Medicine

## 2020-05-06 VITALS — BP 132/66 | HR 72 | Temp 97.8°F | Ht 65.0 in | Wt 297.0 lb

## 2020-05-06 DIAGNOSIS — R7303 Prediabetes: Secondary | ICD-10-CM | POA: Diagnosis not present

## 2020-05-06 DIAGNOSIS — Z9884 Bariatric surgery status: Secondary | ICD-10-CM | POA: Diagnosis not present

## 2020-05-06 DIAGNOSIS — Z9189 Other specified personal risk factors, not elsewhere classified: Secondary | ICD-10-CM

## 2020-05-06 DIAGNOSIS — R790 Abnormal level of blood mineral: Secondary | ICD-10-CM | POA: Diagnosis not present

## 2020-05-06 DIAGNOSIS — Z6841 Body Mass Index (BMI) 40.0 and over, adult: Secondary | ICD-10-CM

## 2020-05-06 NOTE — Progress Notes (Signed)
Chief Complaint:   OBESITY Erin Jimenez is here to discuss her progress with her obesity treatment plan along with follow-up of her obesity related diagnoses. Erin Jimenez is on the Category 2 Plan and states she is following her eating plan approximately 90% of the time. Erin Jimenez states she is walking at work.  Today's visit was #: 5 Starting weight: 305 lbs Starting date: 02/08/2020 Today's weight: 297 lbs Today's date: 05/06/2020 Total lbs lost to date: 8 lbs Total lbs lost since last in-office visit: 4  Interim History: Erin Jimenez went to [place] last week and visited [place] while there.  She says she had a great time.  She had increased alcohol intake.  She is happy with her weight loss.  Subjective:   1. Prediabetes Erin Jimenez has a diagnosis of prediabetes based on her elevated HgA1c and was informed this puts her at greater risk of developing diabetes. She continues to work on diet and exercise to decrease her risk of diabetes. She denies nausea or hypoglycemia.  She is tolerating metformin 250 mg daily well.  Lab Results  Component Value Date   HGBA1C 5.8 (H) 02/08/2020   Lab Results  Component Value Date   INSULIN 28.7 (H) 02/08/2020   2. Low ferritin I have reviewed her visit with Hematology.  She has tried an OTC iron since that visit and says that it made her feel sick.    3. History of laparoscopic adjustable gastric banding Erin Jimenez has a history of lap band surgery in 2006.  4. At risk for heart disease Erin Jimenez is at a higher than average risk for cardiovascular disease due to obesity.   Assessment/Plan:   1. Prediabetes Erin Jimenez will continue to work on weight loss, exercise, and decreasing simple carbohydrates to help decrease the risk of diabetes.   2. Low ferritin Erin Jimenez is interested in an infusion and will call Hematology.  3. History of laparoscopic adjustable gastric banding Erin Jimenez is at risk for malnutrition due to her previous bariatric surgery.    Counseling  You may need to eat 3 meals and 2 snacks, or 5 small meals each day in order to reach your protein and calorie goals.   Allow at least 15 minutes for each meal so that you can eat mindfully. Listen to your body so that you do not overeat. For most people, your sleeve or pouch will comfortably hold 4-6 ounces.  Eat foods from all food groups. This includes fruits and vegetables, grains, dairy, and meat and other proteins.  Include a protein-rich food at every meal and snack, and eat the protein food first.   You should be taking a Bariatric Multivitamin as well as calcium.   4. At risk for heart disease Erin Jimenez was given approximately 15 minutes of coronary artery disease prevention counseling today. She is 49 y.o. female and has risk factors for heart disease including obesity. We discussed intensive lifestyle modifications today with an emphasis on specific weight loss instructions and strategies.   Repetitive spaced learning was employed today to elicit superior memory formation and behavioral change.  5. Class 3 severe obesity with serious comorbidity and body mass index (BMI) of 45.0 to 49.9 in adult, unspecified obesity type [facility]) Erin Jimenez is currently in the action stage of change. As such, her goal is to continue with weight loss efforts. She has agreed to the Category 2 Plan.   Exercise goals: 5000 steps per day.  Behavioral modification strategies: increasing lean protein intake and increasing water intake.  Erin Jimenez has agreed to follow-up with our clinic in 2 weeks, fasting. She was informed of the importance of frequent follow-up visits to maximize her success with intensive lifestyle modifications for her multiple health conditions.   Objective:   Blood pressure 132/66, pulse 72, temperature 97.8 F (36.6 C), temperature source Oral, height 5\' 5"  (1.651 m), weight 297 lb (134.7 kg), SpO2 97 %. Body mass index is 49.42 kg/m.  General: Cooperative, alert, well  developed, in no acute distress. HEENT: Conjunctivae and lids unremarkable. Cardiovascular: Regular rhythm.  Lungs: Normal work of breathing. Neurologic: No focal deficits.   Lab Results  Component Value Date   CREATININE 1.08 (H) 02/08/2020   BUN 7 02/08/2020   NA 141 02/08/2020   K 4.4 02/08/2020   CL 102 02/08/2020   CO2 24 02/08/2020   Lab Results  Component Value Date   ALT 17 02/08/2020   AST 14 02/08/2020   ALKPHOS 127 (H) 02/08/2020   BILITOT 0.3 02/08/2020   Lab Results  Component Value Date   HGBA1C 5.8 (H) 02/08/2020   HGBA1C 5.9 (H) 10/05/2019   HGBA1C 5.8 (H) 07/20/2015   HGBA1C 5.2 05/24/2013   HGBA1C 4.8 02/10/2012   Lab Results  Component Value Date   INSULIN 28.7 (H) 02/08/2020   Lab Results  Component Value Date   TSH 2.650 02/08/2020   Lab Results  Component Value Date   CHOL 175 02/08/2020   HDL 44 02/08/2020   LDLCALC 106 (H) 02/08/2020   TRIG 139 02/08/2020   CHOLHDL 4.0 02/08/2020   Lab Results  Component Value Date   WBC 8.1 04/24/2020   HGB 11.3 (L) 04/24/2020   HCT 36.9 04/24/2020   MCV 80.2 04/24/2020   PLT 293 04/24/2020   Lab Results  Component Value Date   IRON 33 (L) 04/24/2020   TIBC 443 04/24/2020   FERRITIN 12 04/24/2020   Attestation Statements:   Reviewed by clinician on day of visit: allergies, medications, problem list, medical history, surgical history, family history, social history, and previous encounter notes.  I, Water quality scientist, CMA, am acting as Location manager for [employer], DO.  I have reviewed the above documentation for accuracy and completeness, and I agree with the above. Briscoe Deutscher, DO

## 2020-05-06 NOTE — Telephone Encounter (Signed)
TC to pt per Cira Rue NP to let her know that one of the levels on her celiac panel is elevated, could be a false positive and alone this is not diagnostic of celiac disease. Let her know That Regan Rakers  has a message out to her GI Dr. Carlean Purl to get his input on EGD with possible biopsy to r/o celiac disease. If this is positive, it may explain why she does not absorb iron well. Patient verbalized understanding. And let her know that we will contact her when we hear back. Per Lacie verified that she has started taking iron but is not  tolerating oral iron well. She stating that she was very sick and kept vomiting. I let patient know that I will update Lacie about this. Lacie made aware.

## 2020-05-09 ENCOUNTER — Telehealth: Payer: Self-pay | Admitting: Nurse Practitioner

## 2020-05-09 DIAGNOSIS — D509 Iron deficiency anemia, unspecified: Secondary | ICD-10-CM | POA: Insufficient documentation

## 2020-05-09 DIAGNOSIS — D508 Other iron deficiency anemias: Secondary | ICD-10-CM

## 2020-05-09 NOTE — Telephone Encounter (Signed)
Scheduled per 5/27 sch message. Unable to reach pt. Left voicemail- appts added on 6/17, 6/14, 8/27, and 11/24.

## 2020-05-09 NOTE — Telephone Encounter (Signed)
I called Erin Jimenez to f/u on her labs and oral iron use. She started taking oral iron and immediately began vomiting for hours. She has tried various formulations all except liquid iron but is reluctant to try. At this point I recommend IV iron infusion. We reviewed the potential benefit and side effects including but not limited to allergic reaction requiring resuscitative meds/efforts. She agrees to proceed. Will give Feraheme vs venofer pending insurance approval. Plan to schedule her weekly x2 starting first week of June due to the holiday. She agrees with the plan and appreciates the call.   Plan to repeat lab in 3 and 6 months, f/u in 6 months or sooner if needed.   Erin Rue, NP

## 2020-05-10 ENCOUNTER — Other Ambulatory Visit: Payer: Self-pay

## 2020-05-10 ENCOUNTER — Ambulatory Visit (INDEPENDENT_AMBULATORY_CARE_PROVIDER_SITE_OTHER): Payer: 59 | Admitting: Neurology

## 2020-05-10 DIAGNOSIS — G4733 Obstructive sleep apnea (adult) (pediatric): Secondary | ICD-10-CM | POA: Diagnosis not present

## 2020-05-10 DIAGNOSIS — G4730 Sleep apnea, unspecified: Secondary | ICD-10-CM

## 2020-05-10 DIAGNOSIS — G4731 Primary central sleep apnea: Secondary | ICD-10-CM

## 2020-05-10 DIAGNOSIS — Z9989 Dependence on other enabling machines and devices: Secondary | ICD-10-CM

## 2020-05-14 ENCOUNTER — Telehealth: Payer: Self-pay | Admitting: Internal Medicine

## 2020-05-14 NOTE — Progress Notes (Signed)
Intravenous Iron Formulation Change  Erin Jimenez has insurance that requires a change in intravenous iron product from [employer] to Venofer. Orders have been updated to reflect this change and scheduling message sent to adjust infusion appointments. Lacie notified and agrees with the plan. Patient has also been notified of this change via telephone call and understands the change of treatment/schedule.   Allergies:  Allergies  Allergen Reactions  . Ciprofloxacin Nausea Only  . Doxycycline Nausea Only  . Other Nausea Only  . Oxycodone Nausea Only  . Sulfa Antibiotics Nausea Only    The plan for iron therapy is as follows: Venofer 200mg  IV twice weekly x5 infusions.  Demetrius Charity, PharmD, BCPS, [place] [facility] Pharmacist Pharmacy Phone: [phone] 05/14/2020

## 2020-05-14 NOTE — Telephone Encounter (Signed)
Called and spoke to Erin Jimenez regarding scheduling an appointment. Tells me she is having iron infusions and would like to wait and schedule after that. It's a lot of appointments and she is employed. Will call back. I put in a reminder to reach out to her in July 2021.

## 2020-05-14 NOTE — Telephone Encounter (Signed)
-----   Message from Gatha Mayer, MD sent at 05/14/2020  1:16 PM EDT ----- Please make patient an appointment to see me  Thanks  CEG ----- Message ----- From: Truitt Merle, MD Sent: 05/02/2020   5:26 PM EDT To: Gatha Mayer, MD, Alla Feeling, NP   Salley Scarlet Glendell Docker,  Please see message below. You did her EGD in 2014. Would you consider repeating EGD and duodenum biopsy to rule out celiac disease?  Thanks  Krista Blue  ----- Message ----- From: Alla Feeling, NP Sent: 04/30/2020   2:08 PM EDT To: Truitt Merle, MD  Please review her work up, we saw her for low ferritin. Celiac panel shows elevated gliadin IgG. Up to date suggests low diagnostic accuracy and potential for false positive with this test. Do you recommend GI referral and/or EGD with biopsy to confirm?  Thanks, Regan Rakers

## 2020-05-15 ENCOUNTER — Ambulatory Visit (INDEPENDENT_AMBULATORY_CARE_PROVIDER_SITE_OTHER): Payer: 59 | Admitting: Physician Assistant

## 2020-05-15 ENCOUNTER — Other Ambulatory Visit: Payer: Self-pay

## 2020-05-15 ENCOUNTER — Encounter: Payer: Self-pay | Admitting: Physician Assistant

## 2020-05-15 VITALS — BP 122/78 | HR 73 | Temp 97.8°F | Ht 65.0 in | Wt 301.0 lb

## 2020-05-15 DIAGNOSIS — D508 Other iron deficiency anemias: Secondary | ICD-10-CM | POA: Diagnosis not present

## 2020-05-15 NOTE — Progress Notes (Signed)
Erin Jimenez is a 49 y.o. female here to Establish care.  I acted as a Education administrator for [employer], PA-C Anselmo Pickler, LPN   History of Present Illness:   Chief Complaint  Patient presents with  . Establish Care   Anemia Pt has be set up for 5 weeks of iron infusions. She is concerned that this may be too many. She also works FT and is unable to take off this much work. She is questioning if she needs this many infusions. She is unable to tolerate oral iron 2/2 GI side effects. UTD on colonoscopy.  Health Maintenance: Immunizations -- UTD Colonoscopy -- done 2014 normal due 2024 Mammogram -- overdue, pt will schedule with GYN PAP -- N/A, Hysterectomy 2001 Bone Density -- N/A Weight -- Weight: (!) 301 lb (136.5 kg)   Depression screen PHQ 2/9 05/15/2020  Decreased Interest 1  Down, Depressed, Hopeless 0  PHQ - 2 Score 1  Altered sleeping 1  Tired, decreased energy 3  Change in appetite 1  Feeling bad or failure about yourself  0  Trouble concentrating 0  Moving slowly or fidgety/restless 0  Suicidal thoughts 0  PHQ-9 Score 6  Difficult doing work/chores Somewhat difficult    GAD 7 : Generalized Anxiety Score 05/15/2020  Nervous, Anxious, on Edge 1  Control/stop worrying 1  Worry too much - different things 1  Trouble relaxing 1  Restless 1  Easily annoyed or irritable 1  Afraid - awful might happen 0  Total GAD 7 Score 6  Anxiety Difficulty Somewhat difficult     Other providers/specialists: Patient Care Team: Inda Coke, [place] as PCP - General (Physician Assistant) Comer, Okey Regal, MD as Consulting Physician (Infectious Diseases) Meisinger, Sherren Mocha, MD as Consulting Physician (Obstetrics and Gynecology) Jovita Gamma, MD (Neurosurgery) Dohmeier, Asencion Partridge, MD as Consulting Physician (Neurology)   Past Medical History:  Diagnosis Date  . Allergy   . Anemia   . Anxiety   . B12 deficiency   . Chiari malformation   . Chronic headache   . Depression   .  Endometriosis   . Female infertility   . GERD (gastroesophageal reflux disease)    controlled with nexium  . Insomnia   . Irritable bowel syndrome    constipation  . Lower extremity edema   . Obesity   . PCOS (polycystic ovarian syndrome)   . Prediabetes   . Pseudomeningocele, acquired   . Sleep apnea    compliant with Bi-PAP  . Swallowing difficulty    had GI work-up; was told that is was a brain-stem issue     Social History   Tobacco Use  . Smoking status: Never Smoker  . Smokeless tobacco: Never Used  Substance Use Topics  . Alcohol use: Yes    Comment: occ  . Drug use: No    Past Surgical History:  Procedure Laterality Date  . ABDOMINAL HYSTERECTOMY  2001   due to endometriosis  . BRAIN SURGERY     Chiari malformation  . CHOLECYSTECTOMY    . ERCP  2000  . LAPAROSCOPIC GASTRIC BANDING  2006  . SPINAL FUSION      Family History  Problem Relation Age of Onset  . Colon polyps Mother   . Diabetes Mother   . Brain cancer Mother   . Depression Mother   . Colon polyps Father   . Non-Hodgkin's lymphoma Father   . Bladder Cancer Father   . Sleep apnea Father   . Colon cancer Neg  Hx   . Rectal cancer Neg Hx   . Stomach cancer Neg Hx     Allergies  Allergen Reactions  . Ciprofloxacin Nausea Only  . Doxycycline Nausea Only  . Other Nausea Only  . Oxycodone Nausea Only  . Sulfa Antibiotics Nausea Only     Current Medications:   Current Outpatient Medications:  .  ALPRAZolam (XANAX) 0.5 MG tablet, TAKE 1/2 TABLET (0.25 MG TOTAL) BY MOUTH AT BEDTIME AS NEEDED FOR ANXIETY., Disp: 45 tablet, Rfl: 5 .  cholecalciferol (VITAMIN D) 1000 UNITS tablet, Take 4,000 Units by mouth daily. , Disp: , Rfl:  .  cyanocobalamin (,VITAMIN B-12,) 1000 MCG/ML injection, INJECT 1 ML (1,000 MCG TOTAL) INTO THE MUSCLE EVERY 30 (THIRTY) DAYS., Disp: 3 mL, Rfl: 1 .  cyclobenzaprine (FLEXERIL) 10 MG tablet, Take 10 mg by mouth at bedtime., Disp: , Rfl:  .  Desvenlafaxine ER  (PRISTIQ) 50 MG TB24, TAKE 1 TABLET BY MOUTH EVERY DAY, Disp: 90 tablet, Rfl: 2 .  esomeprazole (NEXIUM) 40 MG capsule, Take 1 capsule (40 mg total) by mouth daily before breakfast., Disp: 90 capsule, Rfl: 0 .  Est Estrogens-Methyltest (ESTRATEST PO), Take 1 tablet by mouth daily. , Disp: , Rfl:  .  HYDROCODONE-ACETAMINOPHEN PO, Take 1 tablet by mouth 3 (three) times daily as needed. , Disp: , Rfl:  .  linaclotide (LINZESS) 145 MCG CAPS capsule, Take 1 capsule (145 mcg total) by mouth daily before breakfast. Please schedule a yearly follow up for any further refills, Disp: 30 capsule, Rfl: 1 .  Magnesium (V-R MAGNESIUM) 250 MG TABS, Take by mouth., Disp: , Rfl:  .  metFORMIN (GLUCOPHAGE) 500 MG tablet, TAKE 1/2 TAB BY MOUTH DAILY, Disp: 15 tablet, Rfl: 0 .  traZODone (DESYREL) 50 MG tablet, Take 1 tablet (50 mg total) by mouth at bedtime as needed. for sleep, Disp: 90 tablet, Rfl: 3 .  fluticasone (FLONASE) 50 MCG/ACT nasal spray, Place 2 sprays into the nose daily. Taking as needed, Disp: , Rfl:    Review of Systems:   ROS  Negative unless otherwise specified per HPI.  Vitals:   Vitals:   05/15/20 0912  BP: 122/78  Pulse: 73  Temp: 97.8 F (36.6 C)  TempSrc: Temporal  SpO2: 95%  Weight: (!) 301 lb (136.5 kg)  Height: 5\' 5"  (1.651 m)      Body mass index is 50.09 kg/m.  Physical Exam:   Physical Exam Vitals and nursing note reviewed.  Constitutional:      General: She is not in acute distress.    Appearance: She is well-developed. She is not ill-appearing or toxic-appearing.  Cardiovascular:     Rate and Rhythm: Normal rate and regular rhythm.     Pulses: Normal pulses.     Heart sounds: Normal heart sounds, S1 normal and S2 normal.     Comments: No LE edema Pulmonary:     Effort: Pulmonary effort is normal.     Breath sounds: Normal breath sounds.  Skin:    General: Skin is warm and dry.  Neurological:     Mental Status: She is alert.     GCS: GCS eye subscore is  4. GCS verbal subscore is 5. GCS motor subscore is 6.  Psychiatric:        Speech: Speech normal.        Behavior: Behavior normal. Behavior is cooperative.      Assessment and Plan:   Erin Jimenez was seen today for establish care.  Diagnoses  and all orders for this visit:  Other iron deficiency anemia   Patient has anxiety regarding frequency of infusions. I have recommended that she reach out to hematology to see if she can just pursue the first infusion and see how she does, and if her labs respond accordingly. Patient verbalized understanding.  . Reviewed expectations re: course of current medical issues. . Discussed self-management of symptoms. . Outlined signs and symptoms indicating need for more acute intervention. . Patient verbalized understanding and all questions were answered. . See orders for this visit as documented in the electronic medical record. . Patient received an After-Visit Summary.  CMA or LPN served as scribe during this visit. History, Physical, and Plan performed by medical provider. The above documentation has been reviewed and is accurate and complete.  Inda Coke, PA-C

## 2020-05-15 NOTE — Patient Instructions (Signed)
It was great to see you!  Please schedule an appointment with Dr. Carlean Purl to review constipation and your elevated alk phos.  Good luck with your iron infusions.  Take care,  Inda Coke PA-C

## 2020-05-16 ENCOUNTER — Other Ambulatory Visit: Payer: Self-pay | Admitting: Hematology

## 2020-05-16 ENCOUNTER — Telehealth: Payer: Self-pay | Admitting: *Deleted

## 2020-05-16 ENCOUNTER — Telehealth: Payer: Self-pay | Admitting: Hematology

## 2020-05-16 ENCOUNTER — Other Ambulatory Visit (INDEPENDENT_AMBULATORY_CARE_PROVIDER_SITE_OTHER): Payer: Self-pay | Admitting: Family Medicine

## 2020-05-16 DIAGNOSIS — R7303 Prediabetes: Secondary | ICD-10-CM

## 2020-05-16 NOTE — Telephone Encounter (Signed)
Pt called and wants to reduce her infusion to twice. I will increase venofer to 400mg  each time, she understand it will take longer (2.5h infusion). I sent schedule message to increase her infusion time on 6/7 and 6/14, and cancel her 3 infusions after 6/14. Pt appreciated the call and change. Orders updated.   Truitt Merle  05/16/2020

## 2020-05-16 NOTE — Telephone Encounter (Signed)
Has concerns about all the upcoming infusion appointments and being able to get off work. Asking if MD will consider her having the 1st two infusions and then check her labs again before going further. Will forward her request to MD. Encouraged her she can obtain FMLA forms from employer and we will be glad to complete these for her for intermittent leave for appointments.

## 2020-05-17 ENCOUNTER — Telehealth: Payer: Self-pay | Admitting: Hematology

## 2020-05-17 NOTE — Telephone Encounter (Signed)
Scheduled appt per 6/3 sch message - pt is aware.

## 2020-05-20 ENCOUNTER — Other Ambulatory Visit: Payer: Self-pay

## 2020-05-20 ENCOUNTER — Inpatient Hospital Stay: Payer: 59 | Attending: Hematology

## 2020-05-20 ENCOUNTER — Other Ambulatory Visit: Payer: Self-pay | Admitting: Hematology

## 2020-05-20 ENCOUNTER — Encounter: Payer: Self-pay | Admitting: Neurology

## 2020-05-20 VITALS — BP 140/70 | HR 78 | Temp 98.0°F | Resp 16

## 2020-05-20 DIAGNOSIS — E669 Obesity, unspecified: Secondary | ICD-10-CM | POA: Insufficient documentation

## 2020-05-20 DIAGNOSIS — E611 Iron deficiency: Secondary | ICD-10-CM | POA: Insufficient documentation

## 2020-05-20 DIAGNOSIS — D508 Other iron deficiency anemias: Secondary | ICD-10-CM

## 2020-05-20 DIAGNOSIS — G4733 Obstructive sleep apnea (adult) (pediatric): Secondary | ICD-10-CM | POA: Diagnosis not present

## 2020-05-20 DIAGNOSIS — E538 Deficiency of other specified B group vitamins: Secondary | ICD-10-CM | POA: Diagnosis not present

## 2020-05-20 MED ORDER — SODIUM CHLORIDE 0.9 % IV SOLN
400.0000 mg | Freq: Once | INTRAVENOUS | Status: AC
  Administered 2020-05-20: 400 mg via INTRAVENOUS
  Filled 2020-05-20: qty 20

## 2020-05-20 MED ORDER — SODIUM CHLORIDE 0.9 % IV SOLN
Freq: Once | INTRAVENOUS | Status: AC
  Filled 2020-05-20: qty 250

## 2020-05-20 NOTE — Patient Instructions (Signed)

## 2020-05-22 NOTE — Procedures (Signed)
PATIENT'S NAME:  Maday, Guarino DOB:      09-30-1971      MR#:    [id number]     DATE OF RECORDING: 05/10/2020 [facility] REFERRING :  Ward Givens, NP Study Performed:   Titration to positive airway pressure  HISTORY:  HISTORY OF PRESENT ILLNESS: Last visit with MM, NP on 03/25/20: Ms. Gin is a 49 year old female with a history of obstructive sleep apnea on CPAP.  Her download indicates that she used her machine 26 out of 30 days for compliance of 87%.  She used her machine greater than 4 hours 13 days for compliance of 43%.   On average, she uses her machine 3 hours and 35 minutes.  Her residual AHI is very high- at 13.7/h   Her central apnea residual index is 9.1/h and the obstructive index is 3.6.  She reports that night she feels that she is not getting enough air. CPAP compliance is suboptimal Residual Apnea Index remains elevated and indicates central events. CPAP titration with transition to BiPAP ordered.  The patient endorsed the Epworth Sleepiness Scale at 5 points.   The patient's weight 304 pounds with a height of 66 (inches), resulting in a BMI of 48.9 kg/m2. The patient's neck circumference measured 19 inches.  CURRENT MEDICATIONS: Xanax, Vit D, Vit b12 injection, Flexeril, Pristiq, Nexium, Estratest, Hydrocodone, Linzess, Glucophage, Trazadone, Flonase   PROCEDURE:  This is a multichannel digital polysomnogram utilizing the SomnoStar 11.2 system.  Electrodes and sensors were applied and monitored per AASM Specifications.   EEG, EOG, Chin and Limb EMG, were sampled at 200 Hz.  ECG, Snore and Nasal Pressure, Thermal Airflow, Respiratory Effort, CPAP Flow and Pressure, Oximetry was sampled at 50 Hz. Digital video and audio were recorded.      CPAP was initiated under a ResMed Nasal Pillow in small size at 8 cmH20 with heated humidity per AASM split night standards. This CPAP pressure was advanced to a final 11 cmH20 because of hypopneas, apneas and desaturations. CPAP d8id not alleviate the  apnea and was difficult to tolerate. The next stop was a switch to BiPAP under 12/ 8 cm water and advancing all the way to 17/12 and 18/13 cm water- even with back up rates there was no d significant improvement noted. When central apneas were controlled, obstructive apneas arose. The patient did best at a BiPAP pressure of 14/10 cmH20, there was a reduction of the AHI to 3.3/h and all residual sleep apnea was central in origin.  Lights Out was at 22:22 and Lights On at 04:47. Total recording time (TRT) was 385.5 minutes, with a total sleep time (TST) of 345.5 minutes. The patient's sleep latency was 28 minutes. REM latency was 111.5 minutes.  The sleep efficiency was 89.6 %.    SLEEP ARCHITECTURE: WASO (Wake after sleep onset) was 36.5 minutes.  There were 27.5 minutes in Stage N1, 132 minutes Stage N2, 107 minutes Stage N3 and 79 minutes in Stage REM.  The percentage of Stage N1 was 8.%, Stage N2 was 38.2%, Stage N3 was 31.% and Stage R (REM sleep) was 22.9%.      RESPIRATORY ANALYSIS:  There was a total of 161 respiratory events: 0 obstructive apneas, 66 central apneas and 0 mixed apneas with a total of 66 apneas and an apnea index (AI) of 11.5 /hour.  There were 95 hypopneas with a hypopnea index of 16.5/hour.   The total APNEA/HYPOPNEA INDEX (AHI) was 28.0/hour.  7 events occurred in REM sleep and  154 events in NREM.  The REM AHI was 5.3 /hour versus a non-REM AHI of 34.7 /hour. The patient spent 86.5 minutes of total sleep time in the supine position and 259 minutes in non-supine. The supine AHI was 26.4/h, versus a non-supine AHI of 28.5/h.  OXYGEN SATURATION & C02:  The baseline 02 saturation was 96%, with the lowest being 85%. Time spent below 89% saturation equaled 3 minutes.  The arousals were noted as: 29 were spontaneous, 0 were associated with PLMs, 98 were associated with respiratory events. The patient had a total of 0 Periodic Limb Movements.   The patient took bathroom  breaks. Snoring was noted. EKG was in keeping with normal sinus rhythm (NSR).  DIAGNOSIS 1. Central Sleep Apnea persisted under BiPAP and became evident after obstructive events were controlled.  2. CPAP failed to control apnea and was not comfortable.   PLANS/RECOMMENDATIONS: 1. The patient felt best under BiPAP pressures of 14/10 cm water with a residual AHI of 3.3/h. All other settings were less well tolerated. A ResMed Nasal Pillow in small size was used, but a Electrical engineer swift with earl loops should be considered as it gives more support.  2. We will order BiPAP for her and see how she is doing in 30-60 days of therapy. If data indicate an unsuccessful therapy attempt, she may need to return for ASV .    A follow up appointment will be scheduled in the Sleep Clinic at [facility].   Please call [phone] with any questions.      I certify that I have reviewed the entire raw data recording prior to the issuance of this report in accordance with the Standards of Accreditation of the American Academy of Sleep Medicine (AASM)  Larey Seat, M.D. Diplomat, Tax adviser of Psychiatry and Neurology  Diplomat, Tax adviser of Sleep Medicine Market researcher, [employer] [facility] at [employer]

## 2020-05-22 NOTE — Progress Notes (Signed)
EKG was in keeping with normal sinus rhythm (NSR).   DIAGNOSIS  1. Central Sleep Apnea persisted under BiPAP and became evident  after obstructive events were controlled.  2. CPAP failed to control apnea and was not comfortable.   PLANS/RECOMMENDATIONS:  1. The patient felt best under BiPAP pressures of 14/10 cm water  with a residual AHI of 3.3/h. All other settings were less well  tolerated. A ResMed Nasal Pillow in small size was used, but a  Electrical engineer swift with earl loops should be considered as it gives more  support.  2. We will order BiPAP for her and see how she is doing in 30-60  days of therapy. If data indicate an unsuccessful therapy  attempt, she may need to return for ASV .    A follow up appointment will be scheduled in the Sleep Clinic at  [facility].  Please call [phone] with  any questions.

## 2020-05-22 NOTE — Addendum Note (Signed)
Addended by: Larey Seat on: 05/22/2020 03:56 PM   Modules accepted: Orders

## 2020-05-27 ENCOUNTER — Other Ambulatory Visit: Payer: Self-pay

## 2020-05-27 ENCOUNTER — Inpatient Hospital Stay: Payer: 59

## 2020-05-27 VITALS — BP 146/74 | HR 77 | Temp 98.4°F | Resp 18

## 2020-05-27 DIAGNOSIS — E611 Iron deficiency: Secondary | ICD-10-CM | POA: Diagnosis not present

## 2020-05-27 DIAGNOSIS — D508 Other iron deficiency anemias: Secondary | ICD-10-CM

## 2020-05-27 MED ORDER — SODIUM CHLORIDE 0.9 % IV SOLN
Freq: Once | INTRAVENOUS | Status: AC
  Filled 2020-05-27: qty 250

## 2020-05-27 MED ORDER — SODIUM CHLORIDE 0.9 % IV SOLN
400.0000 mg | Freq: Once | INTRAVENOUS | Status: AC
  Administered 2020-05-27: 400 mg via INTRAVENOUS
  Filled 2020-05-27: qty 20

## 2020-05-27 NOTE — Patient Instructions (Signed)

## 2020-05-28 ENCOUNTER — Telehealth: Payer: Self-pay

## 2020-05-28 NOTE — Telephone Encounter (Signed)
-----   Message from Larey Seat, MD sent at 05/22/2020  3:56 PM EDT ----- EKG was in keeping with normal sinus rhythm (NSR).   DIAGNOSIS  1. Central Sleep Apnea persisted under BiPAP and became evident  after obstructive events were controlled.  2. CPAP failed to control apnea and was not comfortable.   PLANS/RECOMMENDATIONS:  1. The patient felt best under BiPAP pressures of 14/10 cm water  with a residual AHI of 3.3/h. All other settings were less well  tolerated. A ResMed Nasal Pillow in small size was used, but a  Electrical engineer swift with earl loops should be considered as it gives more  support.  2. We will order BiPAP for her and see how she is doing in 30-60  days of therapy. If data indicate an unsuccessful therapy  attempt, she may need to return for ASV .    A follow up appointment will be scheduled in the Sleep Clinic at  [facility].  Please call [phone] with  any questions.

## 2020-05-28 NOTE — Telephone Encounter (Signed)
I called pt. I advised pt that Dr. Brett Fairy  reviewed their sleep study results and found that pt did well during cpap titration with BiPAP. Dr. Brett Fairy recommends that pt start BiPAP therapy at home for treatment. I reviewed PAP compliance expectations with the pt. Pt is agreeable to starting a BiPAP. I advised pt that an order will be sent to a DME, [employer], and [employer] will call the pt within about one week after they file with the pt's insurance. [employer] will show the pt how to use the machine, fit for masks, and troubleshoot the BiPAP if needed. A follow up appt was made for insurance purposes with Dr. Brett Fairy  on 09/10/2020 at 330. Pt verbalized understanding to arrive 15 minutes early and bring their BiPAP. A letter with all of this information in it will be mailed to the pt as a reminder. I verified with the pt that the address we have on file is correct. Pt verbalized understanding of results. Pt had no questions at this time but was encouraged to call back if questions arise. I have sent the order to [employer] and have received confirmation that they have received the order.

## 2020-05-30 ENCOUNTER — Ambulatory Visit: Payer: 59

## 2020-05-31 ENCOUNTER — Other Ambulatory Visit: Payer: Self-pay | Admitting: Gastroenterology

## 2020-05-31 ENCOUNTER — Telehealth: Payer: Self-pay

## 2020-05-31 NOTE — Telephone Encounter (Signed)
-----   Message from Gatha Mayer, MD sent at 05/29/2020 12:26 PM EDT ----- Regarding: Finally responding Sorry for late response  I do think it is reasonable to biopsy the duodenum  We will contact her and set up an office appointment regarding abnormal celiac tests and low iron  Next available is ok  Glendell Docker   ----- Message ----- From: Truitt Merle, MD Sent: 05/02/2020   5:26 PM EDT To: Gatha Mayer, MD, Alla Feeling, NP   Salley Scarlet Glendell Docker,  Please see message below. You did her EGD in 2014. Would you consider repeating EGD and duodenum biopsy to rule out celiac disease?  Thanks  Krista Blue  ----- Message ----- From: Alla Feeling, NP Sent: 04/30/2020   2:08 PM EDT To: Truitt Merle, MD  Please review her work up, we saw her for low ferritin. Celiac panel shows elevated gliadin IgG. Up to date suggests low diagnostic accuracy and potential for false positive with this test. Do you recommend GI referral and/or EGD with biopsy to confirm?  Thanks, Regan Rakers

## 2020-05-31 NOTE — Telephone Encounter (Signed)
I spoke with Erin Jimenez and she has made an appointment for 07/03/2020 at 2:50pm. An earlier one wouldn't work out with her job.

## 2020-06-03 ENCOUNTER — Encounter (INDEPENDENT_AMBULATORY_CARE_PROVIDER_SITE_OTHER): Payer: Self-pay | Admitting: Family Medicine

## 2020-06-03 ENCOUNTER — Ambulatory Visit (INDEPENDENT_AMBULATORY_CARE_PROVIDER_SITE_OTHER): Payer: 59 | Admitting: Family Medicine

## 2020-06-03 ENCOUNTER — Ambulatory Visit: Payer: 59

## 2020-06-03 ENCOUNTER — Other Ambulatory Visit: Payer: Self-pay

## 2020-06-03 VITALS — BP 119/80 | HR 75 | Temp 97.8°F | Ht 65.0 in | Wt 295.0 lb

## 2020-06-03 DIAGNOSIS — G4733 Obstructive sleep apnea (adult) (pediatric): Secondary | ICD-10-CM | POA: Diagnosis not present

## 2020-06-03 DIAGNOSIS — Z9189 Other specified personal risk factors, not elsewhere classified: Secondary | ICD-10-CM | POA: Diagnosis not present

## 2020-06-03 DIAGNOSIS — Z9989 Dependence on other enabling machines and devices: Secondary | ICD-10-CM

## 2020-06-03 DIAGNOSIS — R5383 Other fatigue: Secondary | ICD-10-CM | POA: Diagnosis not present

## 2020-06-03 DIAGNOSIS — R7303 Prediabetes: Secondary | ICD-10-CM | POA: Diagnosis not present

## 2020-06-03 DIAGNOSIS — R790 Abnormal level of blood mineral: Secondary | ICD-10-CM | POA: Diagnosis not present

## 2020-06-03 DIAGNOSIS — Z6841 Body Mass Index (BMI) 40.0 and over, adult: Secondary | ICD-10-CM

## 2020-06-03 NOTE — Progress Notes (Signed)
Chief Complaint:   OBESITY Erin Jimenez is here to discuss her progress with her obesity treatment plan along with follow-up of her obesity related diagnoses. Erin Jimenez is on the Category 2 Plan and states she is following her eating plan approximately 65% of the time. Erin Jimenez states she is walking for 45 minutes 3 times per week.  Today's visit was #: 6 Starting weight: 305 lbs Starting date: 02/08/2020 Today's weight: 295 lbs Today's date: 06/03/2020 Total lbs lost to date: 10 lbs Total lbs lost since last in-office visit: 2 lbs  Interim History: Erin Jimenez has had 2 infusions and feels less foggy-headed. Hematology questions GI blood loss.  Reviewed labs.  She will be seeing Dr. Carlean Purl on 07/03/2020.  She endorses dark stools.  She says she will call Hematology to replace lost FOB cards.  She continues with hard stools, on Linzess.  She will add Metamucil.  Subjective:   1. Low ferritin Erin Jimenez is not a vegetarian.  She has a history of lap band surgery. HGB is low as well, which is new.  She has had 2 appointments.   CBC Latest Ref Rng & Units 04/24/2020 02/08/2020 10/05/2019  WBC 4.0 - 10.5 K/uL 8.1 6.4 7.6  Hemoglobin 12.0 - 15.0 g/dL 11.3(L) 12.0 11.9  Hematocrit 36 - 46 % 36.9 37.2 37.7  Platelets 150 - 400 K/uL 293 332 292   Lab Results  Component Value Date   IRON 33 (L) 04/24/2020   TIBC 443 04/24/2020   FERRITIN 12 04/24/2020   Lab Results  Component Value Date   VITAMINB12 259 02/08/2020   2. Prediabetes Erin Jimenez will continue to work on weight loss, exercise, and decreasing simple carbohydrates to help decrease the risk of diabetes.  She is taking metformin.  3. OSA on CPAP Erin Jimenez has a diagnosis of sleep apnea. BiPAP is recommended.   4. Other fatigue Erin Jimenez is complaining of fatigue, but states that it is improving.  5. At risk for deficient intake of food The patient is at a higher than average risk of deficient intake of food.  Assessment/Plan:   1. Low ferritin Will  repeat labs today.  Orders - Anemia panel  2. Prediabetes Erin Jimenez will continue to work on weight loss, exercise, and decreasing simple carbohydrates to help decrease the risk of diabetes.  Continue metformin.  Orders - Anemia panel - CBC with Differential/Platelet - Comprehensive metabolic panel - Hemoglobin A1c  3. OSA with BIPAP recommended Erin Jimenez says she wall call the home care company tomorrow for an update.  4. Other fatigue Will check labs today.  Orders - Lipid Panel With LDL/HDL Ratio - VITAMIN D 25 Hydroxy (Vit-D Deficiency, Fractures)  5. At risk for deficient intake of food Erin Jimenez was given approximately 15 minutes of deficit intake of food prevention counseling today. Erin Jimenez is at risk for eating too few calories based on current food recall. She was encouraged to focus on meeting caloric and protein goals according to her recommended meal plan.   6. Class 3 severe obesity with serious comorbidity and body mass index (BMI) of 45.0 to 49.9 in adult, unspecified obesity type [facility]) Erin Jimenez is currently in the action stage of change. As such, her goal is to continue with weight loss efforts. She has agreed to the Category 2 Plan.   Exercise goals: For substantial health benefits, adults should do at least 150 minutes (2 hours and 30 minutes) a week of moderate-intensity, or 75 minutes (1 hour and 15 minutes) a week  of vigorous-intensity aerobic physical activity, or an equivalent combination of moderate- and vigorous-intensity aerobic activity. Aerobic activity should be performed in episodes of at least 10 minutes, and preferably, it should be spread throughout the week.  Behavioral modification strategies: increasing lean protein intake, increasing water intake and increasing high fiber foods.  Erin Jimenez has agreed to follow-up with our clinic in 3 weeks. She was informed of the importance of frequent follow-up visits to maximize her success with intensive lifestyle modifications  for her multiple health conditions.   Erin Jimenez was informed we would discuss her lab results at her next visit unless there is a critical issue that needs to be addressed sooner. Erin Jimenez agreed to keep her next visit at the agreed upon time to discuss these results.  Objective:   Blood pressure 119/80, pulse 75, temperature 97.8 F (36.6 C), temperature source Oral, height 5\' 5"  (1.651 m), weight 295 lb (133.8 kg), SpO2 99 %. Body mass index is 49.09 kg/m.  General: Cooperative, alert, well developed, in no acute distress. HEENT: Conjunctivae and lids unremarkable. Cardiovascular: Regular rhythm.  Lungs: Normal work of breathing. Neurologic: No focal deficits.   Lab Results  Component Value Date   CREATININE 1.08 (H) 02/08/2020   BUN 7 02/08/2020   NA 141 02/08/2020   K 4.4 02/08/2020   CL 102 02/08/2020   CO2 24 02/08/2020   Lab Results  Component Value Date   ALT 17 02/08/2020   AST 14 02/08/2020   ALKPHOS 127 (H) 02/08/2020   BILITOT 0.3 02/08/2020   Lab Results  Component Value Date   HGBA1C 5.8 (H) 02/08/2020   HGBA1C 5.9 (H) 10/05/2019   HGBA1C 5.8 (H) 07/20/2015   HGBA1C 5.2 05/24/2013   HGBA1C 4.8 02/10/2012   Lab Results  Component Value Date   INSULIN 28.7 (H) 02/08/2020   Lab Results  Component Value Date   TSH 2.650 02/08/2020   Lab Results  Component Value Date   CHOL 175 02/08/2020   HDL 44 02/08/2020   LDLCALC 106 (H) 02/08/2020   TRIG 139 02/08/2020   CHOLHDL 4.0 02/08/2020   Lab Results  Component Value Date   WBC 8.1 04/24/2020   HGB 11.3 (L) 04/24/2020   HCT 36.9 04/24/2020   MCV 80.2 04/24/2020   PLT 293 04/24/2020   Lab Results  Component Value Date   IRON 33 (L) 04/24/2020   TIBC 443 04/24/2020   FERRITIN 12 04/24/2020   Attestation Statements:   Reviewed by clinician on day of visit: allergies, medications, problem list, medical history, surgical history, family history, social history, and previous encounter notes.  I,  Water quality scientist, CMA, am acting as transcriptionist for Briscoe Deutscher, DO  I have reviewed the above documentation for accuracy and completeness, and I agree with the above. Briscoe Deutscher, DO

## 2020-06-04 LAB — COMPREHENSIVE METABOLIC PANEL
ALT: 24 IU/L (ref 0–32)
AST: 20 IU/L (ref 0–40)
Albumin/Globulin Ratio: 1.6 (ref 1.2–2.2)
Albumin: 4.4 g/dL (ref 3.8–4.9)
Alkaline Phosphatase: 149 IU/L — ABNORMAL HIGH (ref 48–121)
BUN/Creatinine Ratio: 12 (ref 9–23)
BUN: 13 mg/dL (ref 6–24)
Bilirubin Total: 0.4 mg/dL (ref 0.0–1.2)
CO2: 25 mmol/L (ref 20–29)
Calcium: 9.6 mg/dL (ref 8.7–10.2)
Chloride: 99 mmol/L (ref 96–106)
Creatinine, Ser: 1.05 mg/dL — ABNORMAL HIGH (ref 0.57–1.00)
GFR calc Af Amer: 71 mL/min/{1.73_m2} (ref >59)
GFR calc non Af Amer: 62 mL/min/{1.73_m2} (ref >59)
Globulin, Total: 2.7 g/dL (ref 1.5–4.5)
Glucose: 80 mg/dL (ref 65–99)
Potassium: 4.6 mmol/L (ref 3.5–5.2)
Sodium: 137 mmol/L (ref 134–144)
Total Protein: 7.1 g/dL (ref 6.0–8.5)

## 2020-06-04 LAB — CBC WITH DIFFERENTIAL/PLATELET
Basophils Absolute: 0.1 10*3/uL (ref 0.0–0.2)
Basos: 1 %
EOS (ABSOLUTE): 0.2 10*3/uL (ref 0.0–0.4)
Eos: 3 %
Hemoglobin: 12.6 g/dL (ref 11.1–15.9)
Immature Grans (Abs): 0 10*3/uL (ref 0.0–0.1)
Immature Granulocytes: 0 %
Lymphocytes Absolute: 2.3 10*3/uL (ref 0.7–3.1)
Lymphs: 36 %
MCH: 25.9 pg — ABNORMAL LOW (ref 26.6–33.0)
MCHC: 31.6 g/dL (ref 31.5–35.7)
MCV: 82 fL (ref 79–97)
Monocytes Absolute: 0.5 10*3/uL (ref 0.1–0.9)
Monocytes: 8 %
Neutrophils Absolute: 3.4 10*3/uL (ref 1.4–7.0)
Neutrophils: 52 %
Platelets: 297 10*3/uL (ref 150–450)
RBC: 4.86 x10E6/uL (ref 3.77–5.28)
RDW: 18.7 % — ABNORMAL HIGH (ref 11.7–15.4)
WBC: 6.4 10*3/uL (ref 3.4–10.8)

## 2020-06-04 LAB — LIPID PANEL WITH LDL/HDL RATIO
Cholesterol, Total: 182 mg/dL (ref 100–199)
HDL: 43 mg/dL (ref >39)
LDL Chol Calc (NIH): 123 mg/dL — ABNORMAL HIGH (ref 0–99)
LDL/HDL Ratio: 2.9 ratio (ref 0.0–3.2)
Triglycerides: 89 mg/dL (ref 0–149)
VLDL Cholesterol Cal: 16 mg/dL (ref 5–40)

## 2020-06-04 LAB — ANEMIA PANEL
Ferritin: 286 ng/mL — ABNORMAL HIGH (ref 15–150)
Folate, Hemolysate: 444 ng/mL
Folate, RBC: 1113 ng/mL (ref >498)
Hematocrit: 39.9 % (ref 34.0–46.6)
Iron Saturation: 23 % (ref 15–55)
Iron: 81 ug/dL (ref 27–159)
Retic Ct Pct: 2 % (ref 0.6–2.6)
Total Iron Binding Capacity: 348 ug/dL (ref 250–450)
UIBC: 267 ug/dL (ref 131–425)
Vitamin B-12: 405 pg/mL (ref 232–1245)

## 2020-06-04 LAB — HEMOGLOBIN A1C
Est. average glucose Bld gHb Est-mCnc: 111 mg/dL
Hgb A1c MFr Bld: 5.5 % (ref 4.8–5.6)

## 2020-06-04 LAB — VITAMIN D 25 HYDROXY (VIT D DEFICIENCY, FRACTURES): Vit D, 25-Hydroxy: 42.1 ng/mL (ref 30.0–100.0)

## 2020-06-06 ENCOUNTER — Ambulatory Visit: Payer: 59

## 2020-06-07 ENCOUNTER — Other Ambulatory Visit (INDEPENDENT_AMBULATORY_CARE_PROVIDER_SITE_OTHER): Payer: Self-pay | Admitting: Family Medicine

## 2020-06-07 DIAGNOSIS — R7303 Prediabetes: Secondary | ICD-10-CM

## 2020-06-27 ENCOUNTER — Ambulatory Visit (INDEPENDENT_AMBULATORY_CARE_PROVIDER_SITE_OTHER): Payer: 59 | Admitting: Family Medicine

## 2020-07-02 ENCOUNTER — Other Ambulatory Visit: Payer: Self-pay | Admitting: Neurology

## 2020-07-03 ENCOUNTER — Ambulatory Visit (INDEPENDENT_AMBULATORY_CARE_PROVIDER_SITE_OTHER): Payer: 59 | Admitting: Family Medicine

## 2020-07-03 ENCOUNTER — Encounter: Payer: Self-pay | Admitting: Internal Medicine

## 2020-07-03 ENCOUNTER — Ambulatory Visit (INDEPENDENT_AMBULATORY_CARE_PROVIDER_SITE_OTHER): Payer: 59 | Admitting: Internal Medicine

## 2020-07-03 VITALS — BP 148/68 | HR 70 | Ht 66.0 in | Wt 302.0 lb

## 2020-07-03 DIAGNOSIS — E538 Deficiency of other specified B group vitamins: Secondary | ICD-10-CM

## 2020-07-03 DIAGNOSIS — R1310 Dysphagia, unspecified: Secondary | ICD-10-CM | POA: Diagnosis not present

## 2020-07-03 DIAGNOSIS — D509 Iron deficiency anemia, unspecified: Secondary | ICD-10-CM | POA: Diagnosis not present

## 2020-07-03 DIAGNOSIS — R1319 Other dysphagia: Secondary | ICD-10-CM

## 2020-07-03 DIAGNOSIS — R894 Abnormal immunological findings in specimens from other organs, systems and tissues: Secondary | ICD-10-CM

## 2020-07-03 NOTE — Patient Instructions (Signed)
You have been scheduled for an endoscopy. Please follow written instructions given to you at your visit today. If you use inhalers (even only as needed), please bring them with you on the day of your procedure.   Normal BMI (Body Mass Index- based on height and weight) is between 19 and 25. Your BMI today is Body mass index is 48.74 kg/m. Marland Kitchen Please consider follow up  regarding your BMI with your Primary Care Provider.   I appreciate the opportunity to care for you. Silvano Rusk, MD, [facility]

## 2020-07-03 NOTE — Progress Notes (Addendum)
Erin Jimenez 49 y.o. 1971-04-01 [id number]  Assessment & Plan:   Encounter Diagnoses  Name Primary?  . Abnormal celiac antibody panel Yes  . Iron deficiency anemia, unspecified iron deficiency anemia type   . B12 deficiency   . Esophageal dysphagia     I think the neck step is to proceed with an EGD and a duodenal biopsy.  If she had a stricture we would dilated though I doubt that is likely given this chronic dysphagia issue.  We talked about the possibility of performing an esophageal manometry to try to understand things better.  That may be the next step.  Want to sort out the iron deficiency and the abnormal celiac testing first.   If she could do it she would be a good candidate for a low-carb diet perhaps even ultra low-carb i.e. keto with intermittent fasting I think.   Note that if the EGD does not show changes of celiac disease then a colonoscopy given the new iron deficiency anemia is the next step in the anemia evaluation after that.   CC: Erin Jimenez, [place]  Erin Merle, MD     Subjective:   Chief Complaint:  HPI Erin Jimenez is a 49 year old woman with a history of dysphagia of unclear etiology, status post unsuccessful Maloney dilation in 2014 and a history of hematochezia with a negative colonoscopy in 2014.  We have seen her off and on for reflux and IBS constipation predominant.  She was found to be iron deficient with a low ferritin.  Did not tolerate oral iron was given Feraheme infusions through Erin Jimenez.  She had a celiac panel drawn and there was a high IgG delaminated gliadin antibody level.  Remainder the panel including IgA and TTG antibody IgA based an Endomysial antibody IgA and antigliadin antibodies IgA was negative  She is working in the healthy weight loss clinic, to try to lose weight seeingDr. Juleen Jimenez.  She has a deflated lap band in place.  She also has a history of B12 deficiency.  She eats iron rich foods she does not have bleeding she had a  hysterectomy years ago.  I do not think she is a blood donor though I neglected to ask that.   She reports an increase in energy level since having her Feraheme.  She does continue to have intermittent daily esophageal dysphagia symptoms.  Her neurosurgeon told her it might have been related to her brainstem.  An upper GI series in 2014 did not reveal any dysmotility or problems with the LAP-BAND.  She had had a suboccipital craniotomy for Chiari decompression and cervical spine fusion in 2012.  I believe the dysphagia came after that.  Lab Results  Component Value Date   FERRITIN 286 (H) 06/03/2020   CBC Latest Ref Rng & Units 06/03/2020 04/24/2020 02/08/2020  WBC 3.4 - 10.8 x10E3/uL 6.4 8.1 6.4  Hemoglobin 11.1 - 15.9 g/dL 12.6 11.3(L) 12.0  Hematocrit 34.0 - 46.6 % 39.9 36.9 37.2  Platelets 150 - 450 x10E3/uL 297 293 332     Allergies  Allergen Reactions  . Ciprofloxacin Nausea Only  . Doxycycline Nausea Only  . Other Nausea Only  . Oxycodone Nausea Only  . Sulfa Antibiotics Nausea Only   Current Meds  Medication Sig  . ALPRAZolam (XANAX) 0.5 MG tablet TAKE 1/2 TABLET (0.25 MG TOTAL) BY MOUTH AT BEDTIME AS NEEDED FOR ANXIETY.  . cholecalciferol (VITAMIN D) 1000 UNITS tablet Take 4,000 Units by mouth daily.   . cyanocobalamin (,  VITAMIN B-12,) 1000 MCG/ML injection INJECT 1 ML (1,000 MCG TOTAL) INTO THE MUSCLE EVERY 30 (THIRTY) DAYS.  Marland Kitchen cyclobenzaprine (FLEXERIL) 10 MG tablet Take 10 mg by mouth at bedtime.  Marland Kitchen Desvenlafaxine ER (PRISTIQ) 50 MG TB24 TAKE 1 TABLET BY MOUTH EVERY DAY  . esomeprazole (NEXIUM) 40 MG capsule Take 1 capsule (40 mg total) by mouth daily before breakfast.  . Est Estrogens-Methyltest (ESTRATEST PO) Take 1 tablet by mouth daily.   Marland Kitchen HYDROCODONE-ACETAMINOPHEN PO Take 1 tablet by mouth 3 (three) times daily as needed.   Marland Kitchen LINZESS 145 MCG CAPS capsule TAKE 1 CAPSULE BY MOUTH DAILY BEFORE BREAKFAST. PLEASE SCHEDULE A YEARLY FOLLOW UP FOR ANY REFILLS  . Magnesium  (V-R MAGNESIUM) 250 MG TABS Take by mouth.  . metFORMIN (GLUCOPHAGE) 500 MG tablet TAKE 1/2 TABLET BY MOUTH EVERY DAY  . traZODone (DESYREL) 50 MG tablet Take 1 tablet (50 mg total) by mouth at bedtime as needed. for sleep   Past Medical History:  Diagnosis Date  . Allergy   . Anxiety   . B12 deficiency   . Chiari malformation   . Chronic headache   . Depression   . Endometriosis   . Female infertility   . GERD (gastroesophageal reflux disease)    controlled with nexium  . Insomnia   . Iron deficiency anemia   . Irritable bowel syndrome    constipation  . Lower extremity edema   . Obesity   . PCOS (polycystic ovarian syndrome)   . Prediabetes   . Pseudomeningocele, acquired   . Sleep apnea    compliant with Bi-PAP  . Swallowing difficulty    had GI work-up; was told that is was a brain-stem issue   Past Surgical History:  Procedure Laterality Date  . ABDOMINAL HYSTERECTOMY  2001   due to endometriosis  . BRAIN SURGERY     Chiari malformation  . CHOLECYSTECTOMY    . COLONOSCOPY    . ERCP  2000  . ESOPHAGOGASTRODUODENOSCOPY    . LAPAROSCOPIC GASTRIC BANDING  2006  . SPINAL FUSION     Social History   Social History Narrative   Married   Contractor   Fun: likes to travel   No children      family history includes Bladder Cancer in her father; Brain cancer in her mother; Colon polyps in her father and mother; Depression in her mother; Diabetes in her mother; Non-Hodgkin's lymphoma in her father; Sleep apnea in her father.   Review of Systems As per HPI the remainder the review of systems is negative at this time  Objective:   Physical Exam BP (!) 148/68   Pulse 70   Ht 5\' 6"  (1.676 m)   Wt (!) 302 lb (137 kg)   BMI 48.74 kg/m  Obese NAD Lungs cta Cor NL abd soft NT BS +   I have reviewed primary care notes from this year hematology notes and healthy weight and weight loss clinic notes as well as the labs in the EMR

## 2020-07-05 ENCOUNTER — Other Ambulatory Visit (INDEPENDENT_AMBULATORY_CARE_PROVIDER_SITE_OTHER): Payer: Self-pay | Admitting: Family Medicine

## 2020-07-05 DIAGNOSIS — R7303 Prediabetes: Secondary | ICD-10-CM

## 2020-07-06 ENCOUNTER — Other Ambulatory Visit: Payer: Self-pay | Admitting: Nurse Practitioner

## 2020-07-06 DIAGNOSIS — Z8349 Family history of other endocrine, nutritional and metabolic diseases: Secondary | ICD-10-CM

## 2020-07-08 ENCOUNTER — Encounter (INDEPENDENT_AMBULATORY_CARE_PROVIDER_SITE_OTHER): Payer: Self-pay

## 2020-07-08 DIAGNOSIS — R7303 Prediabetes: Secondary | ICD-10-CM

## 2020-07-08 NOTE — Telephone Encounter (Signed)
My chart message sent to pt.

## 2020-07-11 ENCOUNTER — Encounter: Payer: Self-pay | Admitting: Adult Health

## 2020-07-11 ENCOUNTER — Other Ambulatory Visit: Payer: Self-pay | Admitting: Neurology

## 2020-07-11 DIAGNOSIS — G4731 Primary central sleep apnea: Secondary | ICD-10-CM

## 2020-07-11 DIAGNOSIS — Z9989 Dependence on other enabling machines and devices: Secondary | ICD-10-CM

## 2020-07-11 DIAGNOSIS — G4733 Obstructive sleep apnea (adult) (pediatric): Secondary | ICD-10-CM

## 2020-07-22 ENCOUNTER — Encounter: Payer: Self-pay | Admitting: Internal Medicine

## 2020-07-22 ENCOUNTER — Encounter: Payer: Self-pay | Admitting: Physician Assistant

## 2020-07-22 ENCOUNTER — Ambulatory Visit (INDEPENDENT_AMBULATORY_CARE_PROVIDER_SITE_OTHER): Payer: 59 | Admitting: Adult Health

## 2020-07-23 ENCOUNTER — Encounter: Payer: Self-pay | Admitting: Neurology

## 2020-07-24 ENCOUNTER — Telehealth: Payer: Self-pay | Admitting: Physician Assistant

## 2020-07-24 ENCOUNTER — Other Ambulatory Visit: Payer: Self-pay

## 2020-07-24 DIAGNOSIS — E538 Deficiency of other specified B group vitamins: Secondary | ICD-10-CM

## 2020-07-24 MED ORDER — METFORMIN HCL 500 MG PO TABS: 250.0000 mg | ORAL_TABLET | Freq: Every day | ORAL | 0 refills | Status: DC

## 2020-07-24 NOTE — Telephone Encounter (Signed)
Please send to Dr.Wallace for review.

## 2020-07-24 NOTE — Telephone Encounter (Signed)
Noted  

## 2020-07-24 NOTE — Telephone Encounter (Signed)
Please schedule patient for lab only to check B12

## 2020-07-24 NOTE — Telephone Encounter (Signed)
Pt was supposed to see Katy on 08/09 and no showed. Please advise about refills

## 2020-07-24 NOTE — Telephone Encounter (Signed)
Scheduled PT for 8/12 at 8:30

## 2020-07-24 NOTE — Telephone Encounter (Signed)
Patient called and requested lab work for her b12 .

## 2020-07-25 ENCOUNTER — Other Ambulatory Visit: Payer: Self-pay

## 2020-07-25 ENCOUNTER — Other Ambulatory Visit: Payer: 59

## 2020-07-25 DIAGNOSIS — E538 Deficiency of other specified B group vitamins: Secondary | ICD-10-CM

## 2020-07-25 LAB — VITAMIN B12: Vitamin B-12: 555 pg/mL (ref 200–1100)

## 2020-07-26 ENCOUNTER — Other Ambulatory Visit: Payer: Self-pay | Admitting: Physician Assistant

## 2020-07-26 ENCOUNTER — Encounter: Payer: Self-pay | Admitting: Physician Assistant

## 2020-07-26 DIAGNOSIS — Z8349 Family history of other endocrine, nutritional and metabolic diseases: Secondary | ICD-10-CM

## 2020-07-26 MED ORDER — CYANOCOBALAMIN 1000 MCG/ML IJ SOLN: 1000.0000 ug | INTRAMUSCULAR | 2 refills | Status: DC

## 2020-07-28 ENCOUNTER — Other Ambulatory Visit: Payer: Self-pay | Admitting: Neurology

## 2020-07-31 ENCOUNTER — Ambulatory Visit (AMBULATORY_SURGERY_CENTER): Payer: 59 | Admitting: Internal Medicine

## 2020-07-31 ENCOUNTER — Other Ambulatory Visit: Payer: Self-pay

## 2020-07-31 ENCOUNTER — Encounter: Payer: Self-pay | Admitting: Internal Medicine

## 2020-07-31 VITALS — BP 150/73 | HR 75 | Temp 97.7°F | Resp 13 | Ht 66.0 in | Wt 302.0 lb

## 2020-07-31 DIAGNOSIS — R894 Abnormal immunological findings in specimens from other organs, systems and tissues: Secondary | ICD-10-CM

## 2020-07-31 DIAGNOSIS — Z0389 Encounter for observation for other suspected diseases and conditions ruled out: Secondary | ICD-10-CM | POA: Diagnosis not present

## 2020-07-31 MED ORDER — SODIUM CHLORIDE 0.9 % IV SOLN: 500.0000 mL | Freq: Once | INTRAVENOUS | Status: DC

## 2020-07-31 NOTE — Op Note (Addendum)
[place] Patient Name: Erin Jimenez Procedure Date: 07/31/2020 9:57 AM MRN: [id number] Endoscopist: Gatha Mayer , MD Age: 49 Referring MD:  Date of Birth: 11-Jan-1971 Gender: Female Account #: [id number] Procedure:                Upper GI endoscopy Indications:              Positive celiac serologies Medicines:                Propofol per Anesthesia, Monitored Anesthesia Care Procedure:                Pre-Anesthesia Assessment:                           - Prior to the procedure, a History and Physical                            was performed, and patient medications and                            allergies were reviewed. The patient's tolerance of                            previous anesthesia was also reviewed. The risks                            and benefits of the procedure and the sedation                            options and risks were discussed with the patient.                            All questions were answered, and informed consent                            was obtained. Prior Anticoagulants: The patient has                            taken no previous anticoagulant or antiplatelet                            agents. ASA Grade Assessment: III - A patient with                            severe systemic disease. After reviewing the risks                            and benefits, the patient was deemed in                            satisfactory condition to undergo the procedure.                           After obtaining informed consent, the endoscope was  passed under direct vision. Throughout the                            procedure, the patient's blood pressure, pulse, and                            oxygen saturations were monitored continuously. The                            Endoscope was introduced through the mouth, and                            advanced to the second part of duodenum. The upper                            GI  endoscopy was accomplished without difficulty.                            The patient tolerated the procedure well. Scope In: Scope Out: Findings:                 Abnormal motility was noted/suspected in the                            esophagus. There are extra peristaltic waves in the                            esophageal body. The distal esophagus/lower                            esophageal sphincter is open.                           The entire examined stomach was normal.                           The examined duodenum was normal. Biopsies for                            histology were taken with a cold forceps for                            evaluation of celiac disease. Verification of                            patient identification for the specimen was done.                            Estimated blood loss was minimal.                           The cardia and gastric fundus were normal on                            retroflexion. Complications:  No immediate complications. Estimated Blood Loss:     Estimated blood loss was minimal. Impression:               - Abnormal esophageal motility suspected.                           - Normal stomach.                           - Normal examined duodenum. Biopsied. Recommendation:           - Patient has a contact number available for                            emergencies. The signs and symptoms of potential                            delayed complications were discussed with the                            patient. Return to normal activities tomorrow.                            Written discharge instructions were provided to the                            patient.                           - Resume previous diet.                           - Continue present medications.                           - Await pathology results. Consider manometry                            evaluation of esophagus. Gatha Mayer, MD 07/31/2020 10:21:47  AM This report has been signed electronically.

## 2020-07-31 NOTE — Progress Notes (Signed)
PT taken to PACU. Monitors in place. VSS. Report given to RN. 

## 2020-07-31 NOTE — Progress Notes (Signed)
Called to room to assist during endoscopic procedure.  Patient ID and intended procedure confirmed with present staff. Received instructions for my participation in the procedure from the performing physician.  

## 2020-07-31 NOTE — Patient Instructions (Signed)
Resume previous diet Continue  Current medications Await pathology results   YOU HAD AN ENDOSCOPIC PROCEDURE TODAY AT [place]:   Refer to the procedure report that was given to you for any specific questions about what was found during the examination.  If the procedure report does not answer your questions, please call your gastroenterologist to clarify.  If you requested that your care partner not be given the details of your procedure findings, then the procedure report has been included in a sealed envelope for you to review at your convenience later.  YOU SHOULD EXPECT: Some feelings of bloating in the abdomen. Passage of more gas than usual.  Walking can help get rid of the air that was put into your GI tract during the procedure and reduce the bloating. If you had a lower endoscopy (such as a colonoscopy or flexible sigmoidoscopy) you may notice spotting of blood in your stool or on the toilet paper. If you underwent a bowel prep for your procedure, you may not have a normal bowel movement for a few days.  Please Note:  You might notice some irritation and congestion in your nose or some drainage.  This is from the oxygen used during your procedure.  There is no need for concern and it should clear up in a day or so.  SYMPTOMS TO REPORT IMMEDIATELY:   Following upper endoscopy (EGD)  Vomiting of blood or coffee ground material  New chest pain or pain under the shoulder blades  Painful or persistently difficult swallowing  New shortness of breath  Fever of 100F or higher  Black, tarry-looking stools  For urgent or emergent issues, a gastroenterologist can be reached at any hour by calling [phone]. Do not use MyChart messaging for urgent concerns.    DIET:  We do recommend a small meal at first, but then you may proceed to your regular diet.  Drink plenty of fluids but you should avoid alcoholic beverages for 24 hours.  ACTIVITY:  You should plan to take  it easy for the rest of today and you should NOT DRIVE or use heavy machinery until tomorrow (because of the sedation medicines used during the test).    FOLLOW UP: Our staff will call the number listed on your records 48-72 hours following your procedure to check on you and address any questions or concerns that you may have regarding the information given to you following your procedure. If we do not reach you, we will leave a message.  We will attempt to reach you two times.  During this call, we will ask if you have developed any symptoms of COVID 19. If you develop any symptoms (ie: fever, flu-like symptoms, shortness of breath, cough etc.) before then, please call [phone].  If you test positive for Covid 19 in the 2 weeks post procedure, please call and report this information to [place].    If any biopsies were taken you will be contacted by phone or by letter within the next 1-3 weeks.  Please call us at [phone] if you have not heard about the biopsies in 3 weeks.    SIGNATURES/CONFIDENTIALITY: You and/or your care partner have signed paperwork which will be entered into your electronic medical record.  These signatures attest to the fact that that the information above on your After Visit Summary has been reviewed and is understood.  Full responsibility of the confidentiality of this discharge information lies with you and/or your care-partner.

## 2020-08-02 ENCOUNTER — Telehealth: Payer: Self-pay

## 2020-08-02 ENCOUNTER — Telehealth: Payer: Self-pay | Admitting: *Deleted

## 2020-08-02 NOTE — Telephone Encounter (Signed)
Patient called back states she is well but does have a question on her voice coming and going please call back to advise

## 2020-08-02 NOTE — Telephone Encounter (Signed)
No answer, patient requested no message be left, will call back later today, B.Shuaib Corsino RN.

## 2020-08-02 NOTE — Telephone Encounter (Signed)
  Follow up Call-  Call back number 07/31/2020  Post procedure Call Back phone  # [phone]  Permission to leave phone message No  Some recent data might be hidden     Patient questions:  Do you have a fever, pain , or abdominal swelling? No. Pain Score  0 *  Have you tolerated food without any problems? Yes.    Have you been able to return to your normal activities? Yes.    Do you have any questions about your discharge instructions: Diet   No. Medications  No. Follow up visit  No.  Do you have questions or concerns about your Care? No.  Actions: * If pain score is 4 or above: 1. No action needed, pain <4.Have you developed a fever since your procedure? no  2.   Have you had an respiratory symptoms (SOB or cough) since your procedure? no  3.   Have you tested positive for COVID 19 since your procedure no  4.   Have you had any family members/close contacts diagnosed with the COVID 19 since your procedure?  no   If yes to any of these questions please route to Joylene John, RN and Joella Prince, RN

## 2020-08-05 ENCOUNTER — Telehealth: Payer: Self-pay | Admitting: *Deleted

## 2020-08-05 NOTE — Telephone Encounter (Signed)
Spoke with patient regarding her voice coming in and out after her EGD last week. She says that her voice is back to normal now.

## 2020-08-08 ENCOUNTER — Ambulatory Visit: Payer: Self-pay | Admitting: Neurology

## 2020-08-08 ENCOUNTER — Other Ambulatory Visit: Payer: Self-pay

## 2020-08-08 DIAGNOSIS — D508 Other iron deficiency anemias: Secondary | ICD-10-CM

## 2020-08-09 ENCOUNTER — Inpatient Hospital Stay: Payer: 59 | Attending: Hematology

## 2020-08-09 ENCOUNTER — Other Ambulatory Visit: Payer: Self-pay

## 2020-08-09 DIAGNOSIS — D508 Other iron deficiency anemias: Secondary | ICD-10-CM | POA: Diagnosis present

## 2020-08-09 LAB — IRON AND TIBC
Iron: 128 ug/dL (ref 41–142)
Saturation Ratios: 35 % (ref 21–57)
TIBC: 368 ug/dL (ref 236–444)
UIBC: 239 ug/dL (ref 120–384)

## 2020-08-09 LAB — CBC WITH DIFFERENTIAL (CANCER CENTER ONLY)
Abs Immature Granulocytes: 0.04 10*3/uL (ref 0.00–0.07)
Basophils Absolute: 0.1 10*3/uL (ref 0.0–0.1)
Basophils Relative: 1 %
Eosinophils Absolute: 0.3 10*3/uL (ref 0.0–0.5)
Eosinophils Relative: 4 %
HCT: 42.1 % (ref 36.0–46.0)
Hemoglobin: 13.5 g/dL (ref 12.0–15.0)
Immature Granulocytes: 1 %
Lymphocytes Relative: 28 %
Lymphs Abs: 2.4 10*3/uL (ref 0.7–4.0)
MCH: 27.7 pg (ref 26.0–34.0)
MCHC: 32.1 g/dL (ref 30.0–36.0)
MCV: 86.3 fL (ref 80.0–100.0)
Monocytes Absolute: 0.8 10*3/uL (ref 0.1–1.0)
Monocytes Relative: 9 %
Neutro Abs: 5 10*3/uL (ref 1.7–7.7)
Neutrophils Relative %: 57 %
Platelet Count: 280 10*3/uL (ref 150–400)
RBC: 4.88 MIL/uL (ref 3.87–5.11)
RDW: 17.8 % — ABNORMAL HIGH (ref 11.5–15.5)
WBC Count: 8.6 10*3/uL (ref 4.0–10.5)
nRBC: 0 % (ref 0.0–0.2)

## 2020-08-09 LAB — FERRITIN: Ferritin: 49 ng/mL (ref 11–307)

## 2020-08-12 ENCOUNTER — Other Ambulatory Visit: Payer: Self-pay | Admitting: Neurology

## 2020-08-12 ENCOUNTER — Telehealth: Payer: Self-pay

## 2020-08-12 NOTE — Telephone Encounter (Signed)
Voicemail, 3:38p:  Patient wondering if she should keep appt next month or wait until she has the sleep study done?

## 2020-08-13 ENCOUNTER — Encounter: Payer: Self-pay | Admitting: Neurology

## 2020-08-13 NOTE — Telephone Encounter (Signed)
I sent the patient a my chart message.

## 2020-08-18 ENCOUNTER — Other Ambulatory Visit (INDEPENDENT_AMBULATORY_CARE_PROVIDER_SITE_OTHER): Payer: Self-pay | Admitting: Family Medicine

## 2020-08-18 DIAGNOSIS — R7303 Prediabetes: Secondary | ICD-10-CM

## 2020-08-20 ENCOUNTER — Ambulatory Visit (INDEPENDENT_AMBULATORY_CARE_PROVIDER_SITE_OTHER): Payer: 59 | Admitting: Family Medicine

## 2020-08-20 ENCOUNTER — Other Ambulatory Visit: Payer: Self-pay

## 2020-08-20 ENCOUNTER — Encounter (INDEPENDENT_AMBULATORY_CARE_PROVIDER_SITE_OTHER): Payer: Self-pay | Admitting: Family Medicine

## 2020-08-20 VITALS — BP 135/85 | HR 85 | Temp 97.9°F | Ht 65.0 in | Wt 300.0 lb

## 2020-08-20 DIAGNOSIS — G4733 Obstructive sleep apnea (adult) (pediatric): Secondary | ICD-10-CM | POA: Diagnosis not present

## 2020-08-20 DIAGNOSIS — D508 Other iron deficiency anemias: Secondary | ICD-10-CM

## 2020-08-20 DIAGNOSIS — Z9989 Dependence on other enabling machines and devices: Secondary | ICD-10-CM

## 2020-08-20 DIAGNOSIS — Z6841 Body Mass Index (BMI) 40.0 and over, adult: Secondary | ICD-10-CM

## 2020-08-20 DIAGNOSIS — Z9189 Other specified personal risk factors, not elsewhere classified: Secondary | ICD-10-CM | POA: Diagnosis not present

## 2020-08-20 DIAGNOSIS — F419 Anxiety disorder, unspecified: Secondary | ICD-10-CM

## 2020-08-20 DIAGNOSIS — R7303 Prediabetes: Secondary | ICD-10-CM | POA: Diagnosis not present

## 2020-08-20 MED ORDER — METFORMIN HCL 500 MG PO TABS: 250.0000 mg | ORAL_TABLET | Freq: Every day | ORAL | 0 refills | Status: DC

## 2020-08-20 NOTE — Progress Notes (Signed)
Chief Complaint:   OBESITY Erin Jimenez is here to discuss her progress with her obesity treatment plan along with follow-up of her obesity related diagnoses. Erin Jimenez is on the Category 2 Plan and states she is following her eating plan approximately 70% of the time. Erin Jimenez states she is exercising for 0 minutes 0 times per week.  Today's visit was #: 7 Starting weight: 305 lbs Starting date: 02/08/2020 Today's weight: 300 lbs Today's date: 08/20/2020 Total lbs lost to date: 5 lbs Total lbs lost since last in-office visit: 0 Total weight loss percentage to date: -1.64  Interim History: Erin Jimenez's last visit was on 06/03/2020.  She says she got up to 310 pounds, 2 weeks ago.  She got back on plan and is down 10 pounds.  She is going on a cruise in November!  Challenge:  Lunch.  Reviewed adding protein snacks during the workday if not able to sit for lunch:  Fruits, yogurt, nuts, protein shake/bar.  Assessment/Plan:   1. OSA on BiPAP BiPAP is not working.  She is still with apneic episodes.  She is scheduled for a new sleep study on 09/06/2020 with Dr. Brett Fairy.  They have discussed possible central sleep apnea.    2. Other iron deficiency anemia Last infusion was on June 14th (she has completed three).  She says she still feels less foggy.  Recent labs are in the chart and we reviewed them together.  She is not taking an iron supplement.    3. Prediabetes Erin Jimenez has a diagnosis of prediabetes based on her elevated HgA1c and was informed this puts her at greater risk of developing diabetes. She continues to work on diet and exercise to decrease her risk of diabetes. She denies nausea or hypoglycemia.  Lab Results  Component Value Date   HGBA1C 5.5 06/03/2020   Lab Results  Component Value Date   INSULIN 28.7 (H) 02/08/2020   - Refill metFORMIN (GLUCOPHAGE) 500 MG tablet; Take 0.5 tablets (250 mg total) by mouth daily.  Dispense: 15 tablet; Refill: 0  4. Anxiety, with emotional  eating Behavior modification techniques were discussed today to help [name] deal with her anxiety.  Orders and follow up as documented in patient record.   5. At risk for heart disease Erin Jimenez was given approximately 15 minutes of coronary artery disease prevention counseling today. She is 49 y.o. female and has risk factors for heart disease including obesity and OSA. We discussed intensive lifestyle modifications today with an emphasis on specific weight loss instructions and strategies.   OSA is a cause of systemic hypertension and is associated with an increased incidence of stroke, heart failure, atrial fibrillation, and coronary heart disease. Severe OSA increases all-cause mortality and cardiovascular mortality.   Goal: Treatment of OSA via CPAP compliance and weight loss. . Plasma ghrelin levels (appetite or "hunger hormone") are significantly higher in OSA patients than in BMI-matched controls, but decrease to levels similar to those of obese patients without OSA after CPAP treatment.  . Weight loss improves OSA by several mechanisms, including reduction in fatty tissue in the throat (i.e. parapharyngeal fat) and the tongue. Loss of abdominal fat increases mediastinal traction on the upper airway making it less likely to collapse during sleep. . Studies have also shown that compliance with CPAP treatment improves leptin (hunger inhibitory hormone) imbalance.  6. Class 3 severe obesity with serious comorbidity and body mass index (BMI) of 50.0 to 59.9 in adult, unspecified obesity type (HCC) Erin Jimenez is currently in  the action stage of change. As such, her goal is to continue with weight loss efforts. She has agreed to the Category 2 Plan.   Exercise goals: For substantial health benefits, adults should do at least 150 minutes (2 hours and 30 minutes) a week of moderate-intensity, or 75 minutes (1 hour and 15 minutes) a week of vigorous-intensity aerobic physical activity, or an equivalent  combination of moderate- and vigorous-intensity aerobic activity. Aerobic activity should be performed in episodes of at least 10 minutes, and preferably, it should be spread throughout the week.  Behavioral modification strategies: increasing water intake and meal planning and cooking strategies.  Reviewed:  Setting healthy boundaries and decreasing "all or nothing" thinking.  Erin Jimenez has agreed to follow-up with our clinic in 3-4 weeks. She was informed of the importance of frequent follow-up visits to maximize her success with intensive lifestyle modifications for her multiple health conditions.   Objective:   Blood pressure 135/85, pulse 85, temperature 97.9 F (36.6 C), temperature source Oral, height 5\' 5"  (1.651 m), weight 300 lb (136.1 kg), SpO2 96 %. Body mass index is 49.92 kg/m.  General: Cooperative, alert, well developed, in no acute distress. HEENT: Conjunctivae and lids unremarkable. Cardiovascular: Regular rhythm.  Lungs: Normal work of breathing. Neurologic: No focal deficits.   Lab Results  Component Value Date   CREATININE 1.05 (H) 06/03/2020   BUN 13 06/03/2020   NA 137 06/03/2020   K 4.6 06/03/2020   CL 99 06/03/2020   CO2 25 06/03/2020   Lab Results  Component Value Date   ALT 24 06/03/2020   AST 20 06/03/2020   ALKPHOS 149 (H) 06/03/2020   BILITOT 0.4 06/03/2020   Lab Results  Component Value Date   HGBA1C 5.5 06/03/2020   HGBA1C 5.8 (H) 02/08/2020   HGBA1C 5.9 (H) 10/05/2019   HGBA1C 5.8 (H) 07/20/2015   HGBA1C 5.2 05/24/2013   Lab Results  Component Value Date   INSULIN 28.7 (H) 02/08/2020   Lab Results  Component Value Date   TSH 2.650 02/08/2020   Lab Results  Component Value Date   CHOL 182 06/03/2020   HDL 43 06/03/2020   LDLCALC 123 (H) 06/03/2020   TRIG 89 06/03/2020   CHOLHDL 4.0 02/08/2020   Lab Results  Component Value Date   WBC 8.6 08/09/2020   HGB 13.5 08/09/2020   HCT 42.1 08/09/2020   MCV 86.3 08/09/2020   PLT 280  08/09/2020   Lab Results  Component Value Date   IRON 128 08/09/2020   TIBC 368 08/09/2020   FERRITIN 49 08/09/2020   Attestation Statements:   Reviewed by clinician on day of visit: allergies, medications, problem list, medical history, surgical history, family history, social history, and previous encounter notes.  I, Water quality scientist, CMA, am acting as transcriptionist for Briscoe Deutscher, DO  I have reviewed the above documentation for accuracy and completeness, and I agree with the above. Briscoe Deutscher, DO

## 2020-08-29 ENCOUNTER — Other Ambulatory Visit: Payer: Self-pay | Admitting: Neurology

## 2020-09-06 ENCOUNTER — Ambulatory Visit (INDEPENDENT_AMBULATORY_CARE_PROVIDER_SITE_OTHER): Payer: 59 | Admitting: Neurology

## 2020-09-06 ENCOUNTER — Other Ambulatory Visit: Payer: Self-pay

## 2020-09-06 DIAGNOSIS — Z9989 Dependence on other enabling machines and devices: Secondary | ICD-10-CM

## 2020-09-06 DIAGNOSIS — G4733 Obstructive sleep apnea (adult) (pediatric): Secondary | ICD-10-CM | POA: Diagnosis not present

## 2020-09-06 DIAGNOSIS — G4731 Primary central sleep apnea: Secondary | ICD-10-CM

## 2020-09-06 DIAGNOSIS — G4739 Other sleep apnea: Secondary | ICD-10-CM

## 2020-09-10 ENCOUNTER — Encounter: Payer: Self-pay | Admitting: Neurology

## 2020-09-10 ENCOUNTER — Telehealth (INDEPENDENT_AMBULATORY_CARE_PROVIDER_SITE_OTHER): Payer: 59 | Admitting: Neurology

## 2020-09-10 ENCOUNTER — Other Ambulatory Visit: Payer: Self-pay

## 2020-09-10 DIAGNOSIS — G4731 Primary central sleep apnea: Secondary | ICD-10-CM | POA: Diagnosis not present

## 2020-09-10 DIAGNOSIS — Z7189 Other specified counseling: Secondary | ICD-10-CM | POA: Diagnosis not present

## 2020-09-10 DIAGNOSIS — Q0701 Arnold-Chiari syndrome with spina bifida: Secondary | ICD-10-CM | POA: Diagnosis not present

## 2020-09-10 DIAGNOSIS — G4739 Other sleep apnea: Secondary | ICD-10-CM

## 2020-09-10 NOTE — Progress Notes (Signed)
DIAGNOSIS  1. Complex and mostly Central Sleep Apnea in a setting of  respiratory failure and CHF responded to ASV at 13/12/ 4 cm water  pressure, using a FFM.   PLANS/RECOMMENDATIONS: The order for ASV at settings of 13/12/4  cm water will be sent to DME. The patient used a F30i ResMed FFM  in small size-

## 2020-09-10 NOTE — Progress Notes (Signed)
Virtual Visit via Telephone Note  I connected with Erin Jimenez on 09/10/20 at  3:45 PM EDT by telephone and verified that I am speaking with the correct person using two identifiers.  Location: Patient: *at home  Provider: at [facility]   I discussed the limitations, risks, security and privacy concerns of performing an evaluation and management service by telephone and the availability of in person appointments. I also discussed with the patient that there may be a patient responsible charge related to this service. The patient expressed understanding and agreed to proceed.   History of Present Illness: Erin Jimenez has been an established sleep patient in our practice she failed CPAP on an auto CPAP machine and was therefore invited to try BiPAP.  The BiPAP settings of 14/10 centimeters water had been applied,  but her residual AHI was still exceeding the tolerable.  Last AHI measured on 09-09-2020 averaged 11 / hour of sleep with  10 central apneas and only  0.4 obstructive apneas.    This constellation made her needing either BiPAP ST or ASV and she returned for her third sleep study on 9-24 2021.  The technician first tried to initiate BiPAP again beginning at 12/8 centimeters water pressure and increased it step-by-step to an ST 12 and a BiPAP pressure of 16/12 under which the AHI rose to 96/h.  The patient was immediately changed to ASV and under a pressure of 13 over 12/4 cmH2O she reached an AHI of 0.8 sleeping for 292.5 minutes.  The patient woke stating that she had felt as if she had slept for days she was extremely restored and refreshed and very happy with the results.    The order for an ASV at the settings of 13 cm maximum pressure of 4 cm minimum pressure 12 cm EPR were sent to the DME under the use of a ResMed fullface mask F 30 I and small size.     Assessment and Plan: follow up with NP or me in 2-3 month after using ASV at home.   Addendum: the sleep study interpretation was  referring to underlying condition of CHF, which this patient doesn't have. CD   I discussed the assessment and treatment plan with the patient. The patient was provided an opportunity to ask questions and all were answered. The patient agreed with the plan and demonstrated an understanding of the instructions.   The patient was advised to call back or seek an in-person evaluation if the symptoms worsen or if the condition fails to improve as anticipated.  I provided 11 minutes of non-face-to-face time during this encounter.   Larey Seat, MD

## 2020-09-10 NOTE — Addendum Note (Signed)
Addended by: Larey Seat on: 09/10/2020 12:32 PM   Modules accepted: Orders

## 2020-09-10 NOTE — Procedures (Signed)
PATIENT'S NAME:  Erin Jimenez, Erin Jimenez DOB:      04-Jun-1971      MR#:    [id number]     DATE OF RECORDING: 09/06/2020 Rudene Christians REFERRING M.D.:  Shelby Mattocks PA Study Performed:   Titration to BiPAP and ASV HISTORY:    This patient returns for BiPAP or ASV titration after failing CPAP. Her residual AHI on CPAP indicates a rising number of central events.   The patient endorsed the Epworth Sleepiness Scale at 5 points.   The patient's weight 304 pounds with a height of 66 (inches), resulting in a BMI of 48.9 kg/m2. The patient's neck circumference measured 19 inches.  CURRENT MEDICATIONS: Xanax, Vit D, Vit b12 injection, Flexeril, Pristiq, Nexium, Estratest, Hydrocodone, Linzess, Glucophage, Trazadone, Flonase    PROCEDURE:  This is a multichannel digital polysomnogram utilizing the SomnoStar 11.2 system.  Electrodes and sensors were applied and monitored per AASM Specifications.   EEG, EOG, Chin and Limb EMG, were sampled at 200 Hz.  ECG, Snore and Nasal Pressure, Thermal Airflow, Respiratory Effort, CPAP Flow and Pressure, Oximetry was sampled at 50 Hz. Digital video and audio were recorded.       BiPAP was initiated at 12/8 and step by step increased to BiPAP ST at 16/12 cm water- the AHI rose with the pressure to 96/h. The patient was loudly snoring through the BiPAP therapy-  The patient was changed to ASV and here, under 13/12/4 cm water, she reached an AHI of 0.8/h, sleeping 292.5 minutes !Waunita Schooner Out was at 22:03 and Lights On at 04:25. Total recording time (TRT) was 382 minutes, with a total sleep time (TST) of 329 minutes. The patient's sleep latency was 36.5 minutes. REM latency was 115.5 minutes.  The sleep efficiency was 86.1 %.    SLEEP ARCHITECTURE: WASO (Wake after sleep onset) was 35 minutes.  There were 18 minutes in Stage N1, 202 minutes Stage N2, 45 minutes Stage N3 and 64 minutes in Stage REM.  The percentage of Stage N1 was 5.5%, Stage N2 was 61.4%, Stage N3 was 13.7% and  Stage R (REM sleep) was 19.5%.      RESPIRATORY ANALYSIS:  There was a total of 26 respiratory events: 0 obstructive apneas, 0 central apneas and 0 mixed apneas with a total of 0 apneas and an apnea index (AI) of 0 /hour. There were 26 hypopneas with a hypopnea index of 4.7/hour. The patient also had 12 respiratory event related arousals (RERAs).     The total APNEA/HYPOPNEA INDEX  (AHI) was 4.7/hour and the total RESPIRATORY DISTURBANCE INDEX was 6.9 /hour  0 events occurred in REM sleep and 26 events in NREM. The REM AHI was 0 /hour versus a non-REM AHI of 5.9 /hour. The patient spent 0 minutes of total sleep time in the supine position and 329 minutes in non-supine. The supine AHI was 0.0, versus a non-supine AHI of 4.7/h.  OXYGEN SATURATION & C02:  The baseline 02 saturation was 96%, with the lowest being 87%. Time spent below 89% saturation equaled 1 minute.  The arousals were noted as: 23 were spontaneous, 0 were associated with PLMs, 29 were associated with respiratory events. The patient had nocturia times three that night.  The patient had a total of 0 Periodic Limb Movements.  Audio and video analysis did not show any abnormal or unusual movements, behaviors, phonations or vocalizations.  Snoring under BiPAP was still noted and finally alleviated by ASV.   DIAGNOSIS 1. Complex and mostly Central  Sleep Apnea in a setting of respiratory failure and CHF responded to ASV at 13/12/ 4 cm water pressure, using a FFM.   PLANS/RECOMMENDATIONS: The order for ASV at settings of 13/12/4 cm water will be sent to DME. The patient used a F30i ResMed FFM in small size-   DISCUSSION: A follow up appointment will be scheduled in the Sleep Clinic at [facility].   Please call [phone] with any questions.      I certify that I have reviewed the entire raw data recording prior to the issuance of this report in accordance with the Standards of Accreditation of the American Academy  of Sleep Medicine (AASM)    Larey Seat, M.D. Diplomat, Tax adviser of Psychiatry and Neurology  Diplomat, Tax adviser of Sleep Medicine Market researcher, [employer] [facility] at [employer]

## 2020-09-16 ENCOUNTER — Encounter: Payer: Self-pay | Admitting: Neurology

## 2020-09-17 ENCOUNTER — Encounter (INDEPENDENT_AMBULATORY_CARE_PROVIDER_SITE_OTHER): Payer: Self-pay | Admitting: Family Medicine

## 2020-09-17 ENCOUNTER — Other Ambulatory Visit (INDEPENDENT_AMBULATORY_CARE_PROVIDER_SITE_OTHER): Payer: Self-pay | Admitting: Family Medicine

## 2020-09-17 ENCOUNTER — Other Ambulatory Visit: Payer: Self-pay | Admitting: Neurology

## 2020-09-17 ENCOUNTER — Other Ambulatory Visit: Payer: Self-pay

## 2020-09-17 ENCOUNTER — Ambulatory Visit (INDEPENDENT_AMBULATORY_CARE_PROVIDER_SITE_OTHER): Payer: 59 | Admitting: Family Medicine

## 2020-09-17 VITALS — BP 127/82 | HR 82 | Temp 98.0°F | Ht 65.0 in | Wt 302.0 lb

## 2020-09-17 DIAGNOSIS — R7303 Prediabetes: Secondary | ICD-10-CM

## 2020-09-17 DIAGNOSIS — D649 Anemia, unspecified: Secondary | ICD-10-CM

## 2020-09-17 DIAGNOSIS — R5383 Other fatigue: Secondary | ICD-10-CM

## 2020-09-17 DIAGNOSIS — Z6841 Body Mass Index (BMI) 40.0 and over, adult: Secondary | ICD-10-CM

## 2020-09-17 MED ORDER — ARMODAFINIL 50 MG PO TABS
100.0000 mg | ORAL_TABLET | ORAL | 0 refills | Status: DC
Start: 2020-09-17 — End: 2020-09-30

## 2020-09-18 NOTE — Progress Notes (Signed)
Chief Complaint:   OBESITY Erin Jimenez is here to discuss her progress with her obesity treatment plan along with follow-up of her obesity related diagnoses. Erin Jimenez is on the Category 2 Plan and states she is following her eating plan approximately 60-70% of the time. Erin Jimenez states she has increased her activity level.  Today's visit was #: 8 Starting weight: 305 lbs Starting date: 02/08/2020 Today's weight: 302 lbs Today's date: 09/17/2020 Total lbs lost to date: 3 lbs Total lbs lost since last in-office visit: 0 Total weight loss percentage to date: -0.98%  Interim History: Erin Jimenez says that her ASV is on backorder.  She reports being tired lately, and says she has been eating to stay awake.  Assessment/Plan:   1. Anemia Will check labs today including CBC and anemia panel.  Will continue to monitor symptoms as they relate to her weight loss journey.  CBC Latest Ref Rng & Units 08/09/2020 06/03/2020 04/24/2020  WBC 4.0 - 10.5 K/uL 8.6 6.4 8.1  Hemoglobin 12.0 - 15.0 g/dL 13.5 12.6 11.3(L)  Hematocrit 36 - 46 % 42.1 39.9 36.9  Platelets 150 - 400 K/uL 280 297 293   Lab Results  Component Value Date   IRON 128 08/09/2020   TIBC 368 08/09/2020   FERRITIN 49 08/09/2020   Lab Results  Component Value Date   [id number] 07/25/2020   - CBC with Differential/Platelet - Anemia panel  2. Other fatigue Erin Jimenez says she has been more fatigued recently and has been doing more eating in order to stay awake.  Will check labs today.  She has untreated OSA and anemia.  - CBC with Differential/Platelet - Anemia panel  3. Prediabetes Not optimized. Goal is HgbA1c < 5.7 and insulin level closer to 5. She will continue to focus on protein-rich, low simple carbohydrate foods. We reviewed the importance of hydration, regular exercise for stress reduction, and restorative sleep.   Lab Results  Component Value Date   HGBA1C 5.5 06/03/2020   Lab Results  Component Value Date   INSULIN 28.7  (H) 02/08/2020   4. Class 3 severe obesity with serious comorbidity and body mass index (BMI) of 50.0 to 59.9 in adult, unspecified obesity type Erin Jimenez)  Erin Jimenez is currently in the action stage of change. As such, her goal is to continue with weight loss efforts. She has agreed to the Category 2 Plan.   Exercise goals: As is.  Behavioral modification strategies: increasing lean protein intake, decreasing simple carbohydrates, increasing vegetables, decreasing alcohol intake, decreasing sodium intake and increasing high fiber foods.  Erin Jimenez has agreed to follow-up with our clinic in 3 weeks. She was informed of the importance of frequent follow-up visits to maximize her success with intensive lifestyle modifications for her multiple health conditions.   Erin Jimenez was informed we would discuss her lab results at her next visit unless there is a critical issue that needs to be addressed sooner. Erin Jimenez agreed to keep her next visit at the agreed upon time to discuss these results.  Objective:   Blood pressure 127/82, pulse 82, temperature 98 F (36.7 C), temperature source Oral, height 5\' 5"  (1.651 m), weight (!) 302 lb (137 kg), SpO2 93 %. Body mass index is 50.26 kg/m.  General: Cooperative, alert, well developed, in no acute distress. HEENT: Conjunctivae and lids unremarkable. Cardiovascular: Regular rhythm.  Lungs: Normal work of breathing. Neurologic: No focal deficits.   Lab Results  Component Value Date   CREATININE 1.05 (H) 06/03/2020   BUN  13 06/03/2020   NA 137 06/03/2020   K 4.6 06/03/2020   CL 99 06/03/2020   CO2 25 06/03/2020   Lab Results  Component Value Date   ALT 24 06/03/2020   AST 20 06/03/2020   ALKPHOS 149 (H) 06/03/2020   BILITOT 0.4 06/03/2020   Lab Results  Component Value Date   HGBA1C 5.5 06/03/2020   HGBA1C 5.8 (H) 02/08/2020   HGBA1C 5.9 (H) 10/05/2019   HGBA1C 5.8 (H) 07/20/2015   HGBA1C 5.2 05/24/2013   Lab Results  Component Value Date   INSULIN  28.7 (H) 02/08/2020   Lab Results  Component Value Date   TSH 2.650 02/08/2020   Lab Results  Component Value Date   CHOL 182 06/03/2020   HDL 43 06/03/2020   LDLCALC 123 (H) 06/03/2020   TRIG 89 06/03/2020   CHOLHDL 4.0 02/08/2020   Lab Results  Component Value Date   WBC 8.6 08/09/2020   HGB 13.5 08/09/2020   HCT 42.1 08/09/2020   MCV 86.3 08/09/2020   PLT 280 08/09/2020   Lab Results  Component Value Date   IRON 128 08/09/2020   TIBC 368 08/09/2020   FERRITIN 49 08/09/2020   Attestation Statements:   Reviewed by clinician on day of visit: allergies, medications, problem list, medical history, surgical history, family history, social history, and previous encounter notes.  I, Water quality scientist, CMA, am acting as transcriptionist for Briscoe Deutscher, DO  I have reviewed the above documentation for accuracy and completeness, and I agree with the above. Briscoe Deutscher, DO

## 2020-09-19 LAB — ANEMIA PANEL
Ferritin: 76 ng/mL (ref 15–150)
Folate, Hemolysate: 492 ng/mL
Folate, RBC: 1106 ng/mL (ref >498)
Hematocrit: 44.5 % (ref 34.0–46.6)
Iron Saturation: 32 % (ref 15–55)
Iron: 111 ug/dL (ref 27–159)
Retic Ct Pct: 1.7 % (ref 0.6–2.6)
Total Iron Binding Capacity: 351 ug/dL (ref 250–450)
UIBC: 240 ug/dL (ref 131–425)
Vitamin B-12: >2000 pg/mL — ABNORMAL HIGH (ref 232–1245)

## 2020-09-19 LAB — CBC WITH DIFFERENTIAL/PLATELET
Basophils Absolute: 0 10*3/uL (ref 0.0–0.2)
Basos: 1 %
EOS (ABSOLUTE): 0.3 10*3/uL (ref 0.0–0.4)
Eos: 4 %
Hemoglobin: 14.7 g/dL (ref 11.1–15.9)
Immature Grans (Abs): 0 10*3/uL (ref 0.0–0.1)
Immature Granulocytes: 1 %
Lymphocytes Absolute: 2.5 10*3/uL (ref 0.7–3.1)
Lymphs: 38 %
MCH: 29.1 pg (ref 26.6–33.0)
MCHC: 33 g/dL (ref 31.5–35.7)
MCV: 88 fL (ref 79–97)
Monocytes Absolute: 0.6 10*3/uL (ref 0.1–0.9)
Monocytes: 9 %
Neutrophils Absolute: 3.1 10*3/uL (ref 1.4–7.0)
Neutrophils: 47 %
Platelets: 283 10*3/uL (ref 150–450)
RBC: 5.06 x10E6/uL (ref 3.77–5.28)
RDW: 14.5 % (ref 11.7–15.4)
WBC: 6.4 10*3/uL (ref 3.4–10.8)

## 2020-09-22 ENCOUNTER — Other Ambulatory Visit (INDEPENDENT_AMBULATORY_CARE_PROVIDER_SITE_OTHER): Payer: Self-pay | Admitting: Family Medicine

## 2020-09-22 DIAGNOSIS — R7303 Prediabetes: Secondary | ICD-10-CM

## 2020-09-29 ENCOUNTER — Other Ambulatory Visit: Payer: Self-pay | Admitting: Neurology

## 2020-09-30 ENCOUNTER — Other Ambulatory Visit: Payer: Self-pay | Admitting: Neurology

## 2020-09-30 MED ORDER — ARMODAFINIL 50 MG PO TABS
100.0000 mg | ORAL_TABLET | ORAL | 1 refills | Status: DC
Start: 2020-09-30 — End: 2020-12-18

## 2020-10-10 ENCOUNTER — Encounter (INDEPENDENT_AMBULATORY_CARE_PROVIDER_SITE_OTHER): Payer: Self-pay | Admitting: Family Medicine

## 2020-10-10 ENCOUNTER — Other Ambulatory Visit: Payer: Self-pay

## 2020-10-10 ENCOUNTER — Ambulatory Visit (INDEPENDENT_AMBULATORY_CARE_PROVIDER_SITE_OTHER): Payer: 59 | Admitting: Family Medicine

## 2020-10-10 VITALS — BP 147/80 | HR 77 | Temp 97.7°F | Ht 65.0 in | Wt 305.0 lb

## 2020-10-10 DIAGNOSIS — G4733 Obstructive sleep apnea (adult) (pediatric): Secondary | ICD-10-CM | POA: Diagnosis not present

## 2020-10-10 DIAGNOSIS — G4710 Hypersomnia, unspecified: Secondary | ICD-10-CM | POA: Diagnosis not present

## 2020-10-10 DIAGNOSIS — R7303 Prediabetes: Secondary | ICD-10-CM

## 2020-10-10 DIAGNOSIS — D649 Anemia, unspecified: Secondary | ICD-10-CM

## 2020-10-10 DIAGNOSIS — Z9189 Other specified personal risk factors, not elsewhere classified: Secondary | ICD-10-CM | POA: Diagnosis not present

## 2020-10-10 DIAGNOSIS — Z6841 Body Mass Index (BMI) 40.0 and over, adult: Secondary | ICD-10-CM

## 2020-10-12 NOTE — Progress Notes (Signed)
Chief Complaint:   OBESITY Erin Jimenez is here to discuss her progress with her obesity treatment plan along with follow-up of her obesity related diagnoses.   Interim History: She now has her ASV machine. Loves it.   Nutrition Plan: the Category 2 Plan.  Anti-obesity medications: Metformin. Reported side effects: None. Hunger is moderately controlled controlled. Cravings are moderately controlled controlled.  Activity: Increased activity.  Assessment/Plan:   1. Prediabetes Improving, but not optimized. Goal is HgbA1c < 5.7.  Medication: Metformin.  She will continue to focus on protein-rich, low simple carbohydrate foods. We reviewed the importance of hydration, regular exercise for stress reduction, and restorative sleep.   Lab Results  Component Value Date   HGBA1C 5.5 06/03/2020   Lab Results  Component Value Date   INSULIN 28.7 (H) 02/08/2020   2. OSA (obstructive sleep apnea) Erin Jimenez had her first good night of rest in years last night. She is hopeful that this will continue to improve.   3. Anemia Improved. The current medical regimen is effective;  continue present plan and medications.  CBC Latest Ref Rng & Units 09/18/2020 08/09/2020 06/03/2020  WBC 3.4 - 10.8 x10E3/uL 6.4 8.6 6.4  Hemoglobin 11.1 - 15.9 g/dL 14.7 13.5 12.6  Hematocrit 34.0 - 46.6 % 44.5 42.1 39.9  Platelets 150 - 450 x10E3/uL 283 280 297   4. Excessive sleepiness Improving. Followed by Neurology now. Armodafinil has been very helpful. We will continue to monitor symptoms as they relate to her weight loss journey.  5. At risk for hypertension Erin Jimenez was given approximately 15 minutes of hypertension prevention counseling today. Malee is at risk for hypertension due to obesity and stimulant medication. We discussed intensive lifestyle modifications today with an emphasis on weight loss as well as increasing exercise and decreasing salt intake.  Repetitive spaced learning was employed today to  elicit superior memory formation and behavioral change.  6. Class 3 severe obesity with serious comorbidity and body mass index (BMI) of 50.0 to 59.9 in adult, unspecified obesity type [name])  [name] is currently in the action stage of change. As such, her goal is to continue with weight loss efforts.   Nutrition goals: She has agreed to the Category 2 Plan.   Exercise goals: For substantial health benefits, adults should do at least 150 minutes (2 hours and 30 minutes) a week of moderate-intensity, or 75 minutes (1 hour and 15 minutes) a week of vigorous-intensity aerobic physical activity, or an equivalent combination of moderate- and vigorous-intensity aerobic activity. Aerobic activity should be performed in episodes of at least 10 minutes, and preferably, it should be spread throughout the week. Adults should also include muscle-strengthening activities that involve all major muscle groups on 2 or more days a week.  Behavioral modification strategies: increasing lean protein intake, decreasing simple carbohydrates, increasing vegetables and increasing water intake.  Erin Jimenez has agreed to follow-up with our clinic in 3 weeks. She was informed of the importance of frequent follow-up visits to maximize her success with intensive lifestyle modifications for her multiple health conditions.   Objective:   Blood pressure (!) 147/80, pulse 77, temperature 97.7 F (36.5 C), temperature source Oral, height 5\' 5"  (1.651 m), weight (!) 305 lb (138.3 kg), SpO2 94 %. Body mass index is 50.75 kg/m.  General: Cooperative, alert, well developed, in no acute distress. HEENT: Conjunctivae and lids unremarkable. Cardiovascular: Regular rhythm.  Lungs: Normal work of breathing. Neurologic: No focal deficits.   Lab Results  Component  Value Date   CREATININE 1.05 (H) 06/03/2020   BUN 13 06/03/2020   NA 137 06/03/2020   K 4.6 06/03/2020   CL 99 06/03/2020   CO2 25 06/03/2020   Lab Results  Component  Value Date   ALT 24 06/03/2020   AST 20 06/03/2020   ALKPHOS 149 (H) 06/03/2020   BILITOT 0.4 06/03/2020   Lab Results  Component Value Date   HGBA1C 5.5 06/03/2020   HGBA1C 5.8 (H) 02/08/2020   HGBA1C 5.9 (H) 10/05/2019   HGBA1C 5.8 (H) 07/20/2015   HGBA1C 5.2 05/24/2013   Lab Results  Component Value Date   INSULIN 28.7 (H) 02/08/2020   Lab Results  Component Value Date   TSH 2.650 02/08/2020   Lab Results  Component Value Date   CHOL 182 06/03/2020   HDL 43 06/03/2020   LDLCALC 123 (H) 06/03/2020   TRIG 89 06/03/2020   CHOLHDL 4.0 02/08/2020   Lab Results  Component Value Date   WBC 6.4 09/18/2020   HGB 14.7 09/18/2020   HCT 44.5 09/18/2020   MCV 88 09/18/2020   PLT 283 09/18/2020   Lab Results  Component Value Date   IRON 111 09/18/2020   TIBC 351 09/18/2020   FERRITIN 76 09/18/2020   Attestation Statements:   Reviewed by clinician on day of visit: allergies, medications, problem list, medical history, surgical history, family history, social history, and previous encounter notes.

## 2020-10-13 ENCOUNTER — Other Ambulatory Visit (INDEPENDENT_AMBULATORY_CARE_PROVIDER_SITE_OTHER): Payer: Self-pay | Admitting: Family Medicine

## 2020-10-13 DIAGNOSIS — R7303 Prediabetes: Secondary | ICD-10-CM

## 2020-10-14 ENCOUNTER — Encounter (INDEPENDENT_AMBULATORY_CARE_PROVIDER_SITE_OTHER): Payer: Self-pay

## 2020-10-14 NOTE — Telephone Encounter (Signed)
Dr Wallace pt °

## 2020-10-14 NOTE — Telephone Encounter (Signed)
Message sent to pt.

## 2020-11-04 ENCOUNTER — Ambulatory Visit (INDEPENDENT_AMBULATORY_CARE_PROVIDER_SITE_OTHER): Payer: 59 | Admitting: Family Medicine

## 2020-11-06 ENCOUNTER — Inpatient Hospital Stay: Payer: 59 | Admitting: Nurse Practitioner

## 2020-11-06 ENCOUNTER — Inpatient Hospital Stay: Payer: 59

## 2020-11-08 ENCOUNTER — Other Ambulatory Visit: Payer: Self-pay | Admitting: Neurology

## 2020-11-09 ENCOUNTER — Encounter: Payer: Self-pay | Admitting: Neurology

## 2020-11-11 ENCOUNTER — Other Ambulatory Visit: Payer: Self-pay | Admitting: *Deleted

## 2020-11-11 MED ORDER — ALPRAZOLAM 0.5 MG PO TABS
ORAL_TABLET | ORAL | 0 refills | Status: DC
Start: 2020-11-11 — End: 2021-02-10

## 2020-11-17 NOTE — Progress Notes (Deleted)
[place]   [phone] Fax:([phone]) [phone]   Clinic Follow up Note   Patient Care Team: Inda Coke, [place] as PCP - General (Physician Assistant) Linus Salmons, Okey Regal, MD as Consulting Physician (Infectious Diseases) Meisinger, Sherren Mocha, MD as Consulting Physician (Obstetrics and Gynecology) Jovita Gamma, MD (Neurosurgery) Dohmeier, Asencion Partridge, MD as Consulting Physician (Neurology) 11/17/2020  CHIEF COMPLAINT: Follow up low ferritin   CURRENT THERAPY:   INTERVAL HISTORY:   REVIEW OF SYSTEMS:   Constitutional: Denies fevers, chills or abnormal weight loss Eyes: Denies blurriness of vision Ears, nose, mouth, throat, and face: Denies mucositis or sore throat Respiratory: Denies cough, dyspnea or wheezes Cardiovascular: Denies palpitation, chest discomfort or lower extremity swelling Gastrointestinal:  Denies nausea, heartburn or change in bowel habits Skin: Denies abnormal skin rashes Lymphatics: Denies new lymphadenopathy or easy bruising Neurological:Denies numbness, tingling or new weaknesses Behavioral/Psych: Mood is stable, no new changes  All other systems were reviewed with the patient and are negative.  MEDICAL HISTORY:  Past Medical History:  Diagnosis Date  . Allergy   . Anxiety   . B12 deficiency   . Chiari malformation   . Chronic headache   . Depression   . Endometriosis   . Female infertility   . GERD (gastroesophageal reflux disease)    controlled with nexium  . Insomnia   . Iron deficiency anemia   . Irritable bowel syndrome    constipation  . Lower extremity edema   . Obesity   . PCOS (polycystic ovarian syndrome)   . Prediabetes   . Pseudomeningocele, acquired   . Sleep apnea    compliant with Bi-PAP  . Swallowing difficulty    had GI work-up; was told that is was a brain-stem issue    SURGICAL HISTORY: Past Surgical History:  Procedure Laterality Date  . ABDOMINAL HYSTERECTOMY  2001   due to endometriosis  .  BRAIN SURGERY     Chiari malformation  . CHOLECYSTECTOMY    . COLONOSCOPY    . ERCP  2000  . ESOPHAGOGASTRODUODENOSCOPY    . LAPAROSCOPIC GASTRIC BANDING  2006  . SPINAL FUSION      I have reviewed the social history and family history with the patient and they are unchanged from previous note.  ALLERGIES:  is allergic to ciprofloxacin, doxycycline, other, oxycodone, and sulfa antibiotics.  MEDICATIONS:  Current Outpatient Medications  Medication Sig Dispense Refill  . ALPRAZolam (XANAX) 0.5 MG tablet TAKE 1/2 TABLET (0.25 MG TOTAL) BY MOUTH AT BEDTIME AS NEEDED FOR ANXIETY. 45 tablet 0  . Armodafinil 50 MG tablet Take 2 tablets (100 mg total) by mouth every morning. 60 tablet 1  . cholecalciferol (VITAMIN D) 1000 UNITS tablet Take 4,000 Units by mouth daily.     . cyanocobalamin (,VITAMIN B-12,) 1000 MCG/ML injection Inject 1 mL (1,000 mcg total) into the muscle every 30 (thirty) days. 3 mL 2  . cyclobenzaprine (FLEXERIL) 10 MG tablet Take 10 mg by mouth at bedtime.    Marland Kitchen Desvenlafaxine ER (PRISTIQ) 50 MG TB24 TAKE 1 TABLET BY MOUTH EVERY DAY 30 tablet 5  . esomeprazole (NEXIUM) 40 MG capsule Take 1 capsule (40 mg total) by mouth daily before breakfast. 90 capsule 0  . estrogens-methylTEST (ESTRATEST) 1.25-2.5 MG tablet Take 0.5 tablets by mouth daily.     . fluticasone (FLONASE) 50 MCG/ACT nasal spray Place 2 sprays into the nose daily. Taking as needed    . HYDROcodone-acetaminophen (NORCO/VICODIN) 5-325 MG tablet Take 1 tablet by mouth every  8 (eight) hours as needed.    Marland Kitchen LINZESS 145 MCG CAPS capsule TAKE 1 CAPSULE BY MOUTH DAILY BEFORE BREAKFAST. PLEASE SCHEDULE A YEARLY FOLLOW UP FOR ANY REFILLS 30 capsule 5  . Magnesium (V-R MAGNESIUM) 250 MG TABS Take by mouth.    . metFORMIN (GLUCOPHAGE) 500 MG tablet TAKE 1/2 TABLET BY MOUTH EVERY DAY 15 tablet 0  . traZODone (DESYREL) 50 MG tablet Take 1 tablet (50 mg total) by mouth at bedtime as needed. for sleep 90 tablet 3   No current  facility-administered medications for this visit.    PHYSICAL EXAMINATION: ECOG PERFORMANCE STATUS: {CHL ONC ECOG PS:[id number]}  There were no vitals filed for this visit. There were no vitals filed for this visit.  GENERAL:alert, no distress and comfortable SKIN: skin color, texture, turgor are normal, no rashes or significant lesions EYES: normal, Conjunctiva are pink and non-injected, sclera clear OROPHARYNX:no exudate, no erythema and lips, buccal mucosa, and tongue normal  NECK: supple, thyroid normal size, non-tender, without nodularity LYMPH:  no palpable lymphadenopathy in the cervical, axillary or inguinal LUNGS: clear to auscultation and percussion with normal breathing effort HEART: regular rate & rhythm and no murmurs and no lower extremity edema ABDOMEN:abdomen soft, non-tender and normal bowel sounds Musculoskeletal:no cyanosis of digits and no clubbing  NEURO: alert & oriented x 3 with fluent speech, no focal motor/sensory deficits  LABORATORY DATA:  I have reviewed the data as listed CBC Latest Ref Rng & Units 09/18/2020 08/09/2020 06/03/2020  WBC 3.4 - 10.8 x10E3/uL 6.4 8.6 6.4  Hemoglobin 11.1 - 15.9 g/dL 14.7 13.5 12.6  Hematocrit 34.0 - 46.6 % 44.5 42.1 39.9  Platelets 150 - 450 x10E3/uL 283 280 297     CMP Latest Ref Rng & Units 06/03/2020 02/08/2020 10/17/2019  Glucose 65 - 99 mg/dL 80 77 75  BUN 6 - 24 mg/dL 13 7 10   Creatinine 0.57 - 1.00 mg/dL 1.05(H) 1.08(H) 1.16(H)  Sodium 134 - 144 mmol/L 137 141 139  Potassium 3.5 - 5.2 mmol/L 4.6 4.4 4.8  Chloride 96 - 106 mmol/L 99 102 99  CO2 20 - 29 mmol/L 25 24 27   Calcium 8.7 - 10.2 mg/dL 9.6 9.2 9.7  Total Protein 6.0 - 8.5 g/dL 7.1 6.8 -  Total Bilirubin 0.0 - 1.2 mg/dL 0.4 0.3 -  Alkaline Phos 48 - 121 IU/L 149(H) 127(H) 147(H)  AST 0 - 40 IU/L 20 14 -  ALT 0 - 32 IU/L 24 17 -      RADIOGRAPHIC STUDIES: I have personally reviewed the radiological images as listed and agreed with the findings in the  report. No results found.   ASSESSMENT & PLAN:  No problem-specific Assessment & Plan notes found for this encounter.   No orders of the defined types were placed in this encounter.  All questions were answered. The patient knows to call the clinic with any problems, questions or concerns. No barriers to learning was detected. I spent {CHL ONC TIME VISIT - QZESP:[id number]} counseling the patient face to face. The total time spent in the appointment was {CHL ONC TIME VISIT - JFHLK:[id number]} and more than 50% was on counseling and review of test results     Alla Feeling, NP 11/17/20

## 2020-11-18 ENCOUNTER — Inpatient Hospital Stay: Payer: 59 | Admitting: Nurse Practitioner

## 2020-11-18 ENCOUNTER — Inpatient Hospital Stay: Payer: 59

## 2020-11-27 ENCOUNTER — Other Ambulatory Visit: Payer: Self-pay | Admitting: Gastroenterology

## 2020-11-27 ENCOUNTER — Encounter (INDEPENDENT_AMBULATORY_CARE_PROVIDER_SITE_OTHER): Payer: Self-pay | Admitting: Family Medicine

## 2020-11-27 ENCOUNTER — Other Ambulatory Visit (INDEPENDENT_AMBULATORY_CARE_PROVIDER_SITE_OTHER): Payer: Self-pay | Admitting: Family Medicine

## 2020-11-27 DIAGNOSIS — R7303 Prediabetes: Secondary | ICD-10-CM

## 2020-11-27 MED ORDER — METFORMIN HCL 500 MG PO TABS: 250.0000 mg | ORAL_TABLET | Freq: Every day | ORAL | 0 refills | Status: DC

## 2020-11-27 NOTE — Telephone Encounter (Signed)
Dr Wallace pt °

## 2020-12-16 ENCOUNTER — Ambulatory Visit (INDEPENDENT_AMBULATORY_CARE_PROVIDER_SITE_OTHER): Payer: 59 | Admitting: Family Medicine

## 2020-12-18 ENCOUNTER — Other Ambulatory Visit: Payer: Self-pay

## 2020-12-18 ENCOUNTER — Ambulatory Visit (INDEPENDENT_AMBULATORY_CARE_PROVIDER_SITE_OTHER): Payer: 59

## 2020-12-18 ENCOUNTER — Encounter: Payer: Self-pay | Admitting: Physician Assistant

## 2020-12-18 ENCOUNTER — Other Ambulatory Visit (INDEPENDENT_AMBULATORY_CARE_PROVIDER_SITE_OTHER): Payer: Self-pay | Admitting: Family Medicine

## 2020-12-18 ENCOUNTER — Ambulatory Visit (INDEPENDENT_AMBULATORY_CARE_PROVIDER_SITE_OTHER): Payer: 59 | Admitting: Physician Assistant

## 2020-12-18 VITALS — BP 126/80 | HR 83 | Temp 98.0°F | Ht 65.0 in | Wt 317.4 lb

## 2020-12-18 DIAGNOSIS — M7989 Other specified soft tissue disorders: Secondary | ICD-10-CM

## 2020-12-18 DIAGNOSIS — Z23 Encounter for immunization: Secondary | ICD-10-CM | POA: Diagnosis not present

## 2020-12-18 DIAGNOSIS — R011 Cardiac murmur, unspecified: Secondary | ICD-10-CM

## 2020-12-18 DIAGNOSIS — R7303 Prediabetes: Secondary | ICD-10-CM

## 2020-12-18 MED ORDER — FUROSEMIDE 20 MG PO TABS: 20.0000 mg | ORAL_TABLET | Freq: Every day | ORAL | 0 refills | Status: DC

## 2020-12-18 NOTE — Progress Notes (Signed)
Erin Jimenez is a 50 y.o. female here for a new problem.  I acted as a Erin Jimenez for Erin [employer], PA-C Erin Pickler, LPN   History of Present Illness:   Chief Complaint  Patient presents with  . Edema    HPI   Edema Pt c/o bilateral lower leg edema x 1 week L> R. Legs are red and slightly warm. Denies: fever, chills, excessive salt intake, weeping, chest pain, SOB, palpitations, fatigue.  Trialed an OTC diuretic without significant results.  Has had significant weight gain since Oct 2021 of about 12 lb.  Wt Readings from Last 3 Encounters:  12/18/20 (!) 317 lb 6.1 oz (144 kg)  10/10/20 (!) 305 lb (138.3 kg)  09/17/20 (!) 302 lb (137 kg)         Past Medical History:  Diagnosis Date  . Allergy   . Anxiety   . B12 deficiency   . Chiari malformation   . Chronic headache   . Depression   . Endometriosis   . Female infertility   . GERD (gastroesophageal reflux disease)    controlled with nexium  . Insomnia   . Iron deficiency anemia   . Irritable bowel syndrome    constipation  . Lower extremity edema   . Obesity   . PCOS (polycystic ovarian syndrome)   . Prediabetes   . Pseudomeningocele, acquired   . Sleep apnea    compliant with Bi-PAP  . Swallowing difficulty    had GI work-up; was told that is was a brain-stem issue     Social History   Tobacco Use  . Smoking status: Never Smoker  . Smokeless tobacco: Never Used  Vaping Use  . Vaping Use: Never used  Substance Use Topics  . Alcohol use: Yes    Comment: occ  . Drug use: No    Past Surgical History:  Procedure Laterality Date  . ABDOMINAL HYSTERECTOMY  2001   due to endometriosis  . BRAIN SURGERY     Chiari malformation  . CHOLECYSTECTOMY    . COLONOSCOPY    . ERCP  2000  . ESOPHAGOGASTRODUODENOSCOPY    . LAPAROSCOPIC GASTRIC BANDING  2006  . SPINAL FUSION      Family History  Problem Relation Age of Onset  . Colon polyps Mother   . Diabetes Mother   . Brain cancer Mother   .  Depression Mother   . Colon polyps Father   . Non-Hodgkin's lymphoma Father   . Bladder Cancer Father   . Sleep apnea Father   . Colon cancer Neg Hx   . Rectal cancer Neg Hx   . Stomach cancer Neg Hx     Allergies  Allergen Reactions  . Ciprofloxacin Nausea Only  . Doxycycline Nausea Only  . Other Nausea Only  . Oxycodone Nausea Only  . Sulfa Antibiotics Nausea Only    Current Medications:   Current Outpatient Medications:  .  albuterol (VENTOLIN HFA) 108 (90 Base) MCG/ACT inhaler, Inhale into the lungs., Disp: , Rfl:  .  ALPRAZolam (XANAX) 0.5 MG tablet, TAKE 1/2 TABLET (0.25 MG TOTAL) BY MOUTH AT BEDTIME AS NEEDED FOR ANXIETY., Disp: 45 tablet, Rfl: 0 .  cholecalciferol (VITAMIN D) 1000 UNITS tablet, Take 4,000 Units by mouth daily. , Disp: , Rfl:  .  cyanocobalamin (,VITAMIN B-12,) 1000 MCG/ML injection, Inject 1 mL (1,000 mcg total) into the muscle every 30 (thirty) days., Disp: 3 mL, Rfl: 2 .  cyclobenzaprine (FLEXERIL) 10 MG tablet, Take 10  mg by mouth at bedtime., Disp: , Rfl:  .  Desvenlafaxine ER (PRISTIQ) 50 MG TB24, TAKE 1 TABLET BY MOUTH EVERY DAY, Disp: 30 tablet, Rfl: 5 .  esomeprazole (NEXIUM) 40 MG capsule, Take 1 capsule (40 mg total) by mouth daily before breakfast., Disp: 90 capsule, Rfl: 0 .  furosemide (LASIX) 20 MG tablet, Take 1 tablet (20 mg total) by mouth daily., Disp: 30 tablet, Rfl: 0 .  HYDROcodone-acetaminophen (NORCO/VICODIN) 5-325 MG tablet, Take 1 tablet by mouth every 8 (eight) hours as needed., Disp: , Rfl:  .  linaclotide (LINZESS) 145 MCG CAPS capsule, Take 1 capsule (145 mcg total) by mouth daily before breakfast., Disp: 30 capsule, Rfl: 2 .  Magnesium 250 MG TABS, Take by mouth., Disp: , Rfl:  .  metFORMIN (GLUCOPHAGE) 500 MG tablet, Take 0.5 tablets (250 mg total) by mouth daily., Disp: 15 tablet, Rfl: 0 .  Potassium Chloride ER 20 MEQ TBCR, Take 1 tablet by mouth daily in the afternoon., Disp: , Rfl:  .  traZODone (DESYREL) 50 MG tablet,  Take 1 tablet (50 mg total) by mouth at bedtime as needed. for sleep, Disp: 90 tablet, Rfl: 3 .  fluticasone (FLONASE) 50 MCG/ACT nasal spray, Place 2 sprays into the nose daily as needed. Taking as needed, Disp: , Rfl:    Review of Systems:   ROS Negative unless otherwise specified per HPI.  Vitals:   Vitals:   12/18/20 1508  BP: 126/80  Pulse: 83  Temp: 98 F (36.7 C)  TempSrc: Temporal  SpO2: 97%  Weight: (!) 317 lb 6.1 oz (144 kg)  Height: 5\' 5"  (1.651 m)     Body mass index is 52.81 kg/m.  Physical Exam:   Physical Exam Vitals and nursing note reviewed.  Constitutional:      General: She is not in acute distress.    Appearance: She is well-developed. She is not ill-appearing, toxic-appearing or sickly-appearing.  Cardiovascular:     Rate and Rhythm: Normal rate and regular rhythm.     Pulses: Normal pulses.     Heart sounds: S1 normal and S2 normal. Murmur heard.      Comments: Bilateral LE edema and mild erythema, warmth Pulmonary:     Effort: Pulmonary effort is normal.     Breath sounds: Normal breath sounds.  Musculoskeletal:     Comments: Tender to palpation of bilateral calves and LE  Skin:    General: Skin is warm, dry and intact.  Neurological:     Mental Status: She is alert.     GCS: GCS eye subscore is 4. GCS verbal subscore is 5. GCS motor subscore is 6.  Psychiatric:        Mood and Affect: Mood and affect normal.        Speech: Speech normal.        Behavior: Behavior normal. Behavior is cooperative.       Assessment and Plan:   Erin Jimenez was seen today for edema.  Diagnoses and all orders for this visit:  Leg swelling; Murmur Suspect possible venous insufficiency, however with murmur, will perform cardiac work-up as well as r/o infection and blood clot. Start daily lasix 20 mg. Track daily weights with parameters to call us if significant gain given via MyChart. Update chest xray and blood work today. Echo and bilateral vascular  u/s. Follow-up with me in 2 weeks to reassess fluid status or with cardiology if we need to get her based on the above results. -  DG Chest 2 View; Future -     CBC with Differential/Platelet -     Comprehensive metabolic panel -     Cancel: D-Dimer, Quantitative -     B Nat Peptide -     TSH -     ECHOCARDIOGRAM COMPLETE; Future -     VAS [place] LOWER EXTREMITY VENOUS (DVT); Future  Other orders -     furosemide (LASIX) 20 MG tablet; Take 1 tablet (20 mg total) by mouth daily.  CMA or LPN served as scribe during this visit. History, Physical, and Plan performed by medical provider. The above documentation has been reviewed and is accurate and complete.   Jarold Motto, PA-C

## 2020-12-18 NOTE — Patient Instructions (Signed)
It was great to see you!  I suspect that you may have some venous insufficiency, but I also want to make sure we check out your heart and kidneys, also to make sure you do not have infection.  Plan: 1. Update blood work today 2. Chest xray today 3. Schedule ultrasound of heart and bilateral legs 4. Start 20 mg daily oral lasix to see if this can help pull fluid off 5. Consider compression stockings 6. Follow-up will be based on results -- either with me or cardiology.  Chronic Venous Insufficiency Chronic venous insufficiency is a condition where the leg veins cannot effectively pump blood from the legs to the heart. This happens when the vein walls are either stretched, weakened, or damaged, or when the valves inside the vein are damaged. With the right treatment, you should be able to continue with an active life. This condition is also called venous stasis. What are the causes? Common causes of this condition include:  High blood pressure inside the veins (venous hypertension).  Sitting or standing too long, causing increased blood pressure in the leg veins.  A blood clot that blocks blood flow in a vein (deep vein thrombosis, DVT).  Inflammation of a vein (phlebitis) that causes a blood clot to form.  Tumors in the pelvis that cause blood to back up. What increases the risk? The following factors may make you more likely to develop this condition:  Having a family history of this condition.  Obesity.  Pregnancy.  Living without enough regular physical activity or exercise (sedentary lifestyle).  Smoking.  Having a job that requires long periods of standing or sitting in one place.  Being a certain age. Women in their 58s and 50s and men in their 10s are more likely to develop this condition. What are the signs or symptoms? Symptoms of this condition include:  Veins that are enlarged, bulging, or twisted (varicose veins).  Skin breakdown or ulcers.  Reddened skin  or dark discoloration of skin on the leg between the knee and ankle.  Brown, smooth, tight, and painful skin just above the ankle, usually on the inside of the leg (lipodermatosclerosis).  Swelling of the legs. How is this diagnosed? This condition may be diagnosed based on:  Your medical history.  A physical exam.  Tests, such as: ? A procedure that creates an image of a blood vessel and nearby organs and provides information about blood flow through the blood vessel (duplex ultrasound). ? A procedure that tests blood flow (plethysmography). ? A procedure that looks at the veins using X-ray and dye (venogram). How is this treated? The goals of treatment are to help you return to an active life and to minimize pain or disability. Treatment depends on the severity of your condition, and it may include:  Wearing compression stockings. These can help relieve symptoms and help prevent your condition from getting worse. However, they do not cure the condition.  Sclerotherapy. This procedure involves an injection of a solution that shrinks damaged veins.  Surgery. This may involve: ? Removing a diseased vein (vein stripping). ? Cutting off blood flow through the vein (laser ablation surgery). ? Repairing or reconstructing a valve within the affected vein. Follow these instructions at home:      Wear compression stockings as told by your health care provider. These stockings help to prevent blood clots and reduce swelling in your legs.  Take over-the-counter and prescription medicines only as told by your health care provider.  Stay  active by exercising, walking, or doing different activities. Ask your health care provider what activities are safe for you and how much exercise you need.  Drink enough fluid to keep your urine pale yellow.  Do not use any products that contain nicotine or tobacco, such as cigarettes, e-cigarettes, and chewing tobacco. If you need help quitting, ask  your health care provider.  Keep all follow-up visits as told by your health care provider. This is important. Contact a health care provider if you:  Have redness, swelling, or more pain in the affected area.  See a red streak or line that goes up or down from the affected area.  Have skin breakdown or skin loss in the affected area, even if the breakdown is small.  Get an injury in the affected area. Get help right away if:  You get an injury and an open wound in the affected area.  You have: ? Severe pain that does not get better with medicine. ? Sudden numbness or weakness in the foot or ankle below the affected area. ? Trouble moving your foot or ankle. ? A fever. ? Worse or persistent symptoms. ? Chest pain. ? Shortness of breath. Summary  Chronic venous insufficiency is a condition where the leg veins cannot effectively pump blood from the legs to the heart.  Chronic venous insufficiency occurs when the vein walls become stretched, weakened, or damaged, or when valves within the vein are damaged.  Treatment depends on how severe your condition is. It often involves wearing compression stockings and may involve having a procedure.  Make sure you stay active by exercising, walking, or doing different activities. Ask your health care provider what activities are safe for you and how much exercise you need. This information is not intended to replace advice given to you by your health care provider. Make sure you discuss any questions you have with your health care provider. Document Revised: 08/23/2018 Document Reviewed: 08/23/2018 Elsevier Patient Education  2020 [employer].  Take care,  Jarold Motto PA-C

## 2020-12-19 LAB — COMPREHENSIVE METABOLIC PANEL
ALT: 42 U/L — ABNORMAL HIGH (ref 0–35)
AST: 35 U/L (ref 0–37)
Albumin: 4.2 g/dL (ref 3.5–5.2)
Alkaline Phosphatase: 116 U/L (ref 39–117)
BUN: 10 mg/dL (ref 6–23)
CO2: 30 mEq/L (ref 19–32)
Calcium: 9.7 mg/dL (ref 8.4–10.5)
Chloride: 102 mEq/L (ref 96–112)
Creatinine, Ser: 1.02 mg/dL (ref 0.40–1.20)
GFR: 63.58 mL/min (ref >60.00)
Glucose, Bld: 94 mg/dL (ref 70–99)
Potassium: 4.4 mEq/L (ref 3.5–5.1)
Sodium: 138 mEq/L (ref 135–145)
Total Bilirubin: 0.5 mg/dL (ref 0.2–1.2)
Total Protein: 7.1 g/dL (ref 6.0–8.3)

## 2020-12-19 LAB — CBC WITH DIFFERENTIAL/PLATELET
Basophils Absolute: 0 10*3/uL (ref 0.0–0.1)
Basophils Relative: 0.5 % (ref 0.0–3.0)
Eosinophils Absolute: 0.3 10*3/uL (ref 0.0–0.7)
Eosinophils Relative: 4.2 % (ref 0.0–5.0)
HCT: 39.7 % (ref 36.0–46.0)
Hemoglobin: 13.4 g/dL (ref 12.0–15.0)
Lymphocytes Relative: 40 % (ref 12.0–46.0)
Lymphs Abs: 2.7 10*3/uL (ref 0.7–4.0)
MCHC: 33.8 g/dL (ref 30.0–36.0)
MCV: 88.4 fl (ref 78.0–100.0)
Monocytes Absolute: 0.8 10*3/uL (ref 0.1–1.0)
Monocytes Relative: 11.5 % (ref 3.0–12.0)
Neutro Abs: 3 10*3/uL (ref 1.4–7.7)
Neutrophils Relative %: 43.8 % (ref 43.0–77.0)
Platelets: 245 10*3/uL (ref 150.0–400.0)
RBC: 4.49 Mil/uL (ref 3.87–5.11)
RDW: 14.7 % (ref 11.5–15.5)
WBC: 6.8 10*3/uL (ref 4.0–10.5)

## 2020-12-19 LAB — BRAIN NATRIURETIC PEPTIDE: Pro B Natriuretic peptide (BNP): 25 pg/mL (ref 0.0–100.0)

## 2020-12-19 LAB — TSH: TSH: 1.65 u[IU]/mL (ref 0.35–4.50)

## 2020-12-23 ENCOUNTER — Other Ambulatory Visit: Payer: Self-pay

## 2020-12-23 ENCOUNTER — Ambulatory Visit (HOSPITAL_COMMUNITY): Payer: 59 | Attending: Internal Medicine

## 2020-12-23 DIAGNOSIS — M7989 Other specified soft tissue disorders: Secondary | ICD-10-CM | POA: Diagnosis not present

## 2020-12-23 DIAGNOSIS — R011 Cardiac murmur, unspecified: Secondary | ICD-10-CM | POA: Diagnosis not present

## 2020-12-23 LAB — ECHOCARDIOGRAM COMPLETE
AR max vel: 1.7 cm2
AV Area VTI: 1.75 cm2
AV Area mean vel: 1.77 cm2
AV Mean grad: 13 mmHg
AV Peak grad: 22.5 mmHg
Ao pk vel: 2.37 m/s
Area-P 1/2: 4.31 cm2
S' Lateral: 3.3 cm

## 2020-12-25 ENCOUNTER — Ambulatory Visit (HOSPITAL_COMMUNITY)
Admission: RE | Admit: 2020-12-25 | Discharge: 2020-12-25 | Disposition: A | Discharge status: Home / Self Care | Payer: 59 | Source: Ambulatory Visit | Attending: Physician Assistant | Admitting: Physician Assistant

## 2020-12-25 ENCOUNTER — Other Ambulatory Visit: Payer: Self-pay

## 2020-12-25 DIAGNOSIS — M7989 Other specified soft tissue disorders: Secondary | ICD-10-CM | POA: Insufficient documentation

## 2021-01-02 ENCOUNTER — Encounter: Payer: Self-pay | Admitting: Neurology

## 2021-01-02 ENCOUNTER — Ambulatory Visit: Payer: 59 | Admitting: Neurology

## 2021-01-05 ENCOUNTER — Encounter: Payer: Self-pay | Admitting: Neurology

## 2021-01-08 ENCOUNTER — Other Ambulatory Visit: Payer: Self-pay | Admitting: Physician Assistant

## 2021-01-08 DIAGNOSIS — R7303 Prediabetes: Secondary | ICD-10-CM

## 2021-01-08 MED ORDER — METFORMIN HCL 500 MG PO TABS
500.0000 mg | ORAL_TABLET | Freq: Every day | ORAL | 1 refills | Status: DC
Start: 2021-01-08 — End: 2021-06-05

## 2021-01-08 MED ORDER — FUROSEMIDE 40 MG PO TABS: 40.0000 mg | ORAL_TABLET | Freq: Every day | ORAL | 0 refills | Status: DC

## 2021-01-09 ENCOUNTER — Other Ambulatory Visit: Payer: Self-pay | Admitting: Physician Assistant

## 2021-01-15 ENCOUNTER — Other Ambulatory Visit (INDEPENDENT_AMBULATORY_CARE_PROVIDER_SITE_OTHER): Payer: 59

## 2021-01-15 ENCOUNTER — Other Ambulatory Visit: Payer: Self-pay

## 2021-01-15 DIAGNOSIS — R7303 Prediabetes: Secondary | ICD-10-CM | POA: Diagnosis not present

## 2021-01-15 LAB — BASIC METABOLIC PANEL
BUN: 11 mg/dL (ref 6–23)
CO2: 32 mEq/L (ref 19–32)
Calcium: 9.9 mg/dL (ref 8.4–10.5)
Chloride: 102 mEq/L (ref 96–112)
Creatinine, Ser: 1.05 mg/dL (ref 0.40–1.20)
GFR: 61.37 mL/min (ref >60.00)
Glucose, Bld: 131 mg/dL — ABNORMAL HIGH (ref 70–99)
Potassium: 3.9 mEq/L (ref 3.5–5.1)
Sodium: 139 mEq/L (ref 135–145)

## 2021-02-07 ENCOUNTER — Encounter: Payer: Self-pay | Admitting: Neurology

## 2021-02-09 ENCOUNTER — Other Ambulatory Visit: Payer: Self-pay | Admitting: Physician Assistant

## 2021-02-09 ENCOUNTER — Other Ambulatory Visit: Payer: Self-pay | Admitting: Adult Health

## 2021-02-10 ENCOUNTER — Other Ambulatory Visit: Payer: Self-pay | Admitting: Neurology

## 2021-02-10 DIAGNOSIS — Z0289 Encounter for other administrative examinations: Secondary | ICD-10-CM

## 2021-02-10 MED ORDER — ALPRAZOLAM 0.5 MG PO TABS: ORAL_TABLET | ORAL | 0 refills | Status: DC

## 2021-02-27 ENCOUNTER — Ambulatory Visit (INDEPENDENT_AMBULATORY_CARE_PROVIDER_SITE_OTHER): Payer: 59 | Admitting: Neurology

## 2021-02-27 ENCOUNTER — Encounter: Payer: Self-pay | Admitting: Neurology

## 2021-02-27 VITALS — BP 122/78 | HR 88 | Ht 66.0 in | Wt 314.3 lb

## 2021-02-27 DIAGNOSIS — Q0701 Arnold-Chiari syndrome with spina bifida: Secondary | ICD-10-CM

## 2021-02-27 DIAGNOSIS — Z7189 Other specified counseling: Secondary | ICD-10-CM | POA: Diagnosis not present

## 2021-02-27 DIAGNOSIS — G4739 Other sleep apnea: Secondary | ICD-10-CM

## 2021-02-27 DIAGNOSIS — Z6841 Body Mass Index (BMI) 40.0 and over, adult: Secondary | ICD-10-CM

## 2021-02-27 DIAGNOSIS — E662 Morbid (severe) obesity with alveolar hypoventilation: Secondary | ICD-10-CM

## 2021-02-27 DIAGNOSIS — G4731 Primary central sleep apnea: Secondary | ICD-10-CM | POA: Diagnosis not present
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
## 2021-03-05 ENCOUNTER — Other Ambulatory Visit: Payer: Self-pay | Admitting: Internal Medicine

## 2021-03-05 ENCOUNTER — Other Ambulatory Visit: Payer: Self-pay | Admitting: Physician Assistant

## 2021-03-05 ENCOUNTER — Other Ambulatory Visit: Payer: Self-pay | Admitting: Adult Health

## 2021-03-09 ENCOUNTER — Other Ambulatory Visit: Payer: Self-pay | Admitting: Physician Assistant

## 2021-03-20 ENCOUNTER — Other Ambulatory Visit: Payer: Self-pay | Admitting: Physician Assistant

## 2021-03-20 DIAGNOSIS — Z8349 Family history of other endocrine, nutritional and metabolic diseases: Secondary | ICD-10-CM

## 2021-04-05 ENCOUNTER — Other Ambulatory Visit: Payer: Self-pay | Admitting: Physician Assistant

## 2021-04-06 ENCOUNTER — Other Ambulatory Visit: Payer: Self-pay | Admitting: Physician Assistant

## 2021-04-06 DIAGNOSIS — Z8349 Family history of other endocrine, nutritional and metabolic diseases: Secondary | ICD-10-CM

## 2021-04-11 ENCOUNTER — Other Ambulatory Visit: Payer: Self-pay | Admitting: Neurology

## 2021-05-01 ENCOUNTER — Encounter: Payer: Self-pay | Admitting: Neurology

## 2021-05-05 ENCOUNTER — Other Ambulatory Visit: Payer: Self-pay | Admitting: Neurology

## 2021-05-05 MED ORDER — ALPRAZOLAM 0.5 MG PO TABS: ORAL_TABLET | ORAL | 0 refills | Status: DC

## 2021-05-16 ENCOUNTER — Other Ambulatory Visit: Payer: Self-pay | Admitting: Neurosurgery

## 2021-05-16 ENCOUNTER — Encounter: Payer: Self-pay | Admitting: Physician Assistant

## 2021-05-16 DIAGNOSIS — G500 Trigeminal neuralgia: Secondary | ICD-10-CM

## 2021-05-19 ENCOUNTER — Other Ambulatory Visit: Payer: Self-pay | Admitting: Neurosurgery

## 2021-05-19 DIAGNOSIS — G935 Compression of brain: Secondary | ICD-10-CM

## 2021-05-27 ENCOUNTER — Ambulatory Visit (INDEPENDENT_AMBULATORY_CARE_PROVIDER_SITE_OTHER): Payer: 59 | Admitting: Family

## 2021-05-27 ENCOUNTER — Other Ambulatory Visit: Payer: Self-pay

## 2021-05-27 ENCOUNTER — Encounter: Payer: Self-pay | Admitting: Family

## 2021-05-27 VITALS — BP 124/72 | HR 85 | Temp 98.3°F | Ht 66.0 in | Wt 313.4 lb

## 2021-05-27 DIAGNOSIS — Z8349 Family history of other endocrine, nutritional and metabolic diseases: Secondary | ICD-10-CM

## 2021-05-27 DIAGNOSIS — Z0000 Encounter for general adult medical examination without abnormal findings: Secondary | ICD-10-CM | POA: Diagnosis not present

## 2021-05-27 DIAGNOSIS — Z1231 Encounter for screening mammogram for malignant neoplasm of breast: Secondary | ICD-10-CM | POA: Diagnosis not present

## 2021-05-27 DIAGNOSIS — E538 Deficiency of other specified B group vitamins: Secondary | ICD-10-CM

## 2021-05-27 MED ORDER — CYANOCOBALAMIN 1000 MCG/ML IJ SOLN: 1000.0000 ug | INTRAMUSCULAR | 2 refills | Status: DC

## 2021-05-27 NOTE — Patient Instructions (Signed)
Health Maintenance, Female Adopting a healthy lifestyle and getting preventive care are important in promoting health and wellness. Ask your health care provider about: The right schedule for you to have regular tests and exams. Things you can do on your own to prevent diseases and keep yourself healthy. What should I know about diet, weight, and exercise? Eat a healthy diet  Eat a diet that includes plenty of vegetables, fruits, low-fat dairy products, and lean protein. Do not eat a lot of foods that are high in solid fats, added sugars, or sodium.  Maintain a healthy weight Body mass index (BMI) is used to identify weight problems. It estimates body fat based on height and weight. Your health care provider can help determineyour BMI and help you achieve or maintain a healthy weight. Get regular exercise Get regular exercise. This is one of the most important things you can do for your health. Most adults should: Exercise for at least 150 minutes each week. The exercise should increase your heart rate and make you sweat (moderate-intensity exercise). Do strengthening exercises at least twice a week. This is in addition to the moderate-intensity exercise. Spend less time sitting. Even light physical activity can be beneficial. Watch cholesterol and blood lipids Have your blood tested for lipids and cholesterol at 52 years of age, then havethis test every 5 years. Have your cholesterol levels checked more often if: Your lipid or cholesterol levels are high. You are older than 52 years of age. You are at high risk for heart disease. What should I know about cancer screening? Depending on your health history and family history, you may need to have cancer screening at various ages. This may include screening for: Breast cancer. Cervical cancer. Colorectal cancer. Skin cancer. Lung cancer. What should I know about heart disease, diabetes, and high blood pressure? Blood pressure and heart  disease High blood pressure causes heart disease and increases the risk of stroke. This is more likely to develop in people who have high blood pressure readings, are of African descent, or are overweight. Have your blood pressure checked: Every 3-5 years if you are 18-39 years of age. Every year if you are 40 years old or older. Diabetes Have regular diabetes screenings. This checks your fasting blood sugar level. Have the screening done: Once every three years after age 40 if you are at a normal weight and have a low risk for diabetes. More often and at a younger age if you are overweight or have a high risk for diabetes. What should I know about preventing infection? Hepatitis B If you have a higher risk for hepatitis B, you should be screened for this virus. Talk with your health care provider to find out if you are at risk forhepatitis B infection. Hepatitis C Testing is recommended for: Everyone born from 1945 through 1965. Anyone with known risk factors for hepatitis C. Sexually transmitted infections (STIs) Get screened for STIs, including gonorrhea and chlamydia, if: You are sexually active and are younger than 52 years of age. You are older than 52 years of age and your health care provider tells you that you are at risk for this type of infection. Your sexual activity has changed since you were last screened, and you are at increased risk for chlamydia or gonorrhea. Ask your health care provider if you are at risk. Ask your health care provider about whether you are at high risk for HIV. Your health care provider may recommend a prescription medicine to help   prevent HIV infection. If you choose to take medicine to prevent HIV, you should first get tested for HIV. You should then be tested every 3 months for as long as you are taking the medicine. Pregnancy If you are about to stop having your period (premenopausal) and you may become pregnant, seek counseling before you get  pregnant. Take 400 to 800 micrograms (mcg) of folic acid every day if you become pregnant. Ask for birth control (contraception) if you want to prevent pregnancy. Osteoporosis and menopause Osteoporosis is a disease in which the bones lose minerals and strength with aging. This can result in bone fractures. If you are 30 years old or older, or if you are at risk for osteoporosis and fractures, ask your health care provider if you should: Be screened for bone loss. Take a calcium or vitamin D supplement to lower your risk of fractures. Be given hormone replacement therapy (HRT) to treat symptoms of menopause. Follow these instructions at home: Lifestyle Do not use any products that contain nicotine or tobacco, such as cigarettes, e-cigarettes, and chewing tobacco. If you need help quitting, ask your health care provider. Do not use street drugs. Do not share needles. Ask your health care provider for help if you need support or information about quitting drugs. Alcohol use Do not drink alcohol if: Your health care provider tells you not to drink. You are pregnant, may be pregnant, or are planning to become pregnant. If you drink alcohol: Limit how much you use to 0-1 drink a day. Limit intake if you are breastfeeding. Be aware of how much alcohol is in your drink. In the [place] one drink equals one 12 oz bottle of beer (355 mL), one 5 oz glass of wine (148 mL), or one 1 oz glass of hard liquor (44 mL). General instructions Schedule regular health, dental, and eye exams. Stay current with your vaccines. Tell your health care provider if: You often feel depressed. You have ever been abused or do not feel safe at home. Summary Adopting a healthy lifestyle and getting preventive care are important in promoting health and wellness. Follow your health care provider's instructions about healthy diet, exercising, and getting tested or screened for diseases. Follow your health care provider's  instructions on monitoring your cholesterol and blood pressure. This information is not intended to replace advice given to you by your health care provider. Make sure you discuss any questions you have with your healthcare provider. Document Revised: 11/23/2018 Document Reviewed: 11/23/2018 Elsevier Patient Education  2022 [place] and Cholesterol Restricted Eating Plan Getting too much fat and cholesterol in your diet may cause health problems. Choosing the right foods helps keep your fat and cholesterol at normal levels.This can keep you from getting certain diseases. Your doctor may recommend an eating plan that includes: Total fat: ______% or less of total calories a day. Saturated fat: ______% or less of total calories a day. Cholesterol: less than _________mg a day. Fiber: ______g a day. What are tips for following this plan? Meal planning At meals, divide your plate into four equal parts: Fill one-half of your plate with vegetables and green salads. Fill one-fourth of your plate with whole grains. Fill one-fourth of your plate with low-fat (lean) protein foods. Eat fish that is high in omega-3 fats at least two times a week. This includes mackerel, tuna, sardines, and salmon. Eat foods that are high in fiber, such as whole grains, beans, apples, broccoli, carrots, peas, and barley. General  tips  Work with your doctor to lose weight if you need to. Avoid: Foods with added sugar. Fried foods. Foods with partially hydrogenated oils. Limit alcohol intake to no more than 1 drink a day for nonpregnant women and 2 drinks a day for men. One drink equals 12 oz of beer, 5 oz of wine, or 1 oz of hard liquor.  Reading food labels Check food labels for: Trans fats. Partially hydrogenated oils. Saturated fat (g) in each serving. Cholesterol (mg) in each serving. Fiber (g) in each serving. Choose foods with healthy fats, such as: Monounsaturated fats. Polyunsaturated  fats. Omega-3 fats. Choose grain products that have whole grains. Look for the word "whole" as the first word in the ingredient list. Cooking Cook foods using low-fat methods. These include baking, boiling, grilling, and broiling. Eat more home-cooked foods. Eat at restaurants and buffets less often. Avoid cooking using saturated fats, such as butter, cream, palm oil, palm kernel oil, and coconut oil. Recommended foods  Fruits All fresh, canned (in natural juice), or frozen fruits. Vegetables Fresh or frozen vegetables (raw, steamed, roasted, or grilled). Green salads. Grains Whole grains, such as whole wheat or whole grain breads, crackers, cereals, and pasta. Unsweetened oatmeal, bulgur, barley, quinoa, or brown rice. Corn or whole wheat flour tortillas. Meats and other protein foods Ground beef (85% or leaner), grass-fed beef, or beef trimmed of fat. Skinless chicken or [place]. Ground chicken or [place]. Pork trimmed of fat. All fish and seafood. Egg whites. Dried beans, peas, or lentils. Unsalted nuts or seeds. Unsalted canned beans. Nut butters without added sugar or oil. Dairy Low-fat or nonfat dairy products, such as skim or 1% milk, 2% or reduced-fat cheeses, low-fat and fat-free ricotta or cottage cheese, or plain low-fat and nonfat yogurt. Fats and oils Tub margarine without trans fats. Light or reduced-fat mayonnaise and salad dressings. Avocado. Olive, canola, sesame, or safflower oils. The items listed above may not be a complete list of foods and beverages youcan eat. Contact a dietitian for more information. Foods to avoid Fruits Canned fruit in heavy syrup. Fruit in cream or butter sauce. Fried fruit. Vegetables Vegetables cooked in cheese, cream, or butter sauce. Fried vegetables. Grains White bread. White pasta. White rice. Cornbread. Bagels, pastries, and croissants. Crackers and snack foods that contain trans fat and hydrogenated oils. Meats and other protein  foods Fatty cuts of meat. Ribs, chicken wings, bacon, sausage, bologna, salami, chitterlings, fatback, hot dogs, bratwurst, and packaged lunch meats. Liver and organ meats. Whole eggs and egg yolks. Chicken and [place] with skin. Fried meat. Dairy Whole or 2% milk, cream, half-and-half, and cream cheese. Whole milk cheeses. Whole-fat or sweetened yogurt. Full-fat cheeses. Nondairy creamers and whipped toppings. Processed cheese, cheese spreads, and cheese curds. Beverages Alcohol. Sugar-sweetened drinks such as sodas, lemonade, and fruit drinks. Fats and oils Butter, stick margarine, lard, shortening, ghee, or bacon fat. Coconut, palm kernel, and palm oils. Sweets and desserts Corn syrup, sugars, honey, and molasses. Candy. Jam and jelly. Syrup. Sweetened cereals. Cookies, pies, cakes, donuts, muffins, and ice cream. The items listed above may not be a complete list of foods and beverages youshould avoid. Contact a dietitian for more information. Summary Choosing the right foods helps keep your fat and cholesterol at normal levels. This can keep you from getting certain diseases. At meals, fill one-half of your plate with vegetables and green salads. Eat high-fiber foods, like whole grains, beans, apples, carrots, peas, and barley. Limit added sugar, saturated fats, alcohol, and  fried foods. This information is not intended to replace advice given to you by your health care provider. Make sure you discuss any questions you have with your healthcare provider. Document Revised: 04/03/2020 Document Reviewed: 04/03/2020 Elsevier Patient Education  2022 [employer].

## 2021-05-27 NOTE — Progress Notes (Signed)
Established Patient Office Visit  Subjective:  Patient ID: Erin Jimenez, female    DOB: 12/10/1971  Age: 50 y.o. MRN: [id number]  CC:  Chief Complaint  Patient presents with  . Annual Exam  . Vitamin B12    Pt has concerns about her levels.     HPI Erin Jimenez presents for complete physical exam.  She has a long history of anxiety, depression, chronic constipation, obesity, B12 deficiency.  She has been off her B12 for 2 weeks.  She does not routinely exercise or follow any particular diet.  Does report walking her dog.  She is due to have a colonoscopy and would like to schedule it on her own.  She takes Linzess 145 mg daily for chronic constipation that appears to be working well.  She has not had a mammogram.  She has had a complete hysterectomy  Past Medical History:  Diagnosis Date  . Allergy   . Anxiety   . B12 deficiency   . Chiari malformation   . Chronic headache   . Depression   . Endometriosis   . Female infertility   . GERD (gastroesophageal reflux disease)    controlled with nexium  . Insomnia   . Iron deficiency anemia   . Irritable bowel syndrome    constipation  . Lower extremity edema   . Obesity   . PCOS (polycystic ovarian syndrome)   . Prediabetes   . Pseudomeningocele, acquired   . Sleep apnea    compliant with Bi-PAP  . Swallowing difficulty    had GI work-up; was told that is was a brain-stem issue    Past Surgical History:  Procedure Laterality Date  . ABDOMINAL HYSTERECTOMY  2001   due to endometriosis  . BRAIN SURGERY     Chiari malformation  . CHOLECYSTECTOMY    . COLONOSCOPY    . ERCP  2000  . ESOPHAGOGASTRODUODENOSCOPY    . LAPAROSCOPIC GASTRIC BANDING  2006  . SPINAL FUSION      Family History  Problem Relation Age of Onset  . Colon polyps Mother   . Diabetes Mother   . Brain cancer Mother   . Depression Mother   . Colon polyps Father   . Non-Hodgkin's lymphoma Father   . Bladder Cancer Father   . Sleep apnea Father    . Colon cancer Neg Hx   . Rectal cancer Neg Hx   . Stomach cancer Neg Hx     Social History   Socioeconomic History  . Marital status: Married    Spouse name: Erin Jimenez  . Number of children: 0  . Years of education: Not on file  . Highest education level: Not on file  Occupational History  . Occupation: Scientist, research (life sciences): [employer]  Tobacco Use  . Smoking status: Never  . Smokeless tobacco: Never  Vaping Use  . Vaping Use: Never used  Substance and Sexual Activity  . Alcohol use: Yes    Comment: occ  . Drug use: No  . Sexual activity: Yes    Birth control/protection: Surgical  Other Topics Concern  . Not on file  Social History Narrative   Married   Contractor   Fun: likes to travel   No children      Social Determinants of Health   Financial Resource Strain: Not on file  Food Insecurity: Not on file  Transportation Needs: Not on file  Physical Activity: Not on  file  Stress: Not on file  Social Connections: Not on file  Intimate Partner Violence: Not on file    Outpatient Medications Prior to Visit  Medication Sig Dispense Refill  . ALPRAZolam (XANAX) 0.5 MG tablet TAKE 1/2 TABLET (0.25 MG TOTAL) BY MOUTH AT BEDTIME AS NEEDED FOR ANXIETY. 45 tablet 0  . cholecalciferol (VITAMIN D) 1000 UNITS tablet Take 4,000 Units by mouth daily.     . cyclobenzaprine (FLEXERIL) 10 MG tablet Take 10 mg by mouth at bedtime.    Marland Kitchen Desvenlafaxine ER (PRISTIQ) 50 MG TB24 TAKE 1 TABLET BY MOUTH EVERY DAY 30 tablet 5  . esomeprazole (NEXIUM) 40 MG capsule Take 1 capsule (40 mg total) by mouth daily before breakfast. 90 capsule 0  . furosemide (LASIX) 40 MG tablet TAKE 1 TABLET BY MOUTH EVERY DAY 90 tablet 0  . HYDROcodone-acetaminophen (NORCO/VICODIN) 5-325 MG tablet Take 1 tablet by mouth every 8 (eight) hours as needed.    Marland Kitchen LINZESS 145 MCG CAPS capsule TAKE 1 CAPSULE BY MOUTH DAILY BEFORE BREAKFAST. 30 capsule 2  . Magnesium 250 MG  TABS Take by mouth.    . metFORMIN (GLUCOPHAGE) 500 MG tablet Take 1 tablet (500 mg total) by mouth daily. 90 tablet 1  . Potassium Chloride ER 20 MEQ TBCR Take 1 tablet by mouth daily in the afternoon.    . traZODone (DESYREL) 50 MG tablet TAKE 1 TABLET (50 MG TOTAL) BY MOUTH AT BEDTIME AS NEEDED. FOR SLEEP 90 tablet 3  . cyanocobalamin (,VITAMIN B-12,) 1000 MCG/ML injection Inject 1 mL (1,000 mcg total) into the muscle every 30 (thirty) days. 3 mL 2  . fluticasone (FLONASE) 50 MCG/ACT nasal spray Place 2 sprays into the nose daily as needed. Taking as needed     No facility-administered medications prior to visit.    Allergies  Allergen Reactions  . Ciprofloxacin Nausea Only  . Doxycycline Nausea Only  . Other Nausea Only  . Oxycodone Nausea Only  . Sulfa Antibiotics Nausea Only    ROS Review of Systems  All other systems reviewed and are negative.    Objective:    Physical Exam Vitals and nursing note reviewed.  Constitutional:      Appearance: Normal appearance. She is obese.  HENT:     Head: Normocephalic.     Right Ear: Tympanic membrane and ear canal normal.     Left Ear: Tympanic membrane and ear canal normal.     Nose: Nose normal.     Mouth/Throat:     Mouth: Mucous membranes are moist.  Eyes:     Extraocular Movements: Extraocular movements intact.     Conjunctiva/sclera: Conjunctivae normal.     Pupils: Pupils are equal, round, and reactive to light.  Cardiovascular:     Rate and Rhythm: Normal rate and regular rhythm.  Pulmonary:     Effort: Pulmonary effort is normal.     Breath sounds: Normal breath sounds.  Abdominal:     General: Abdomen is flat. Bowel sounds are normal.     Palpations: Abdomen is soft.  Genitourinary:    Comments: Deferred to GYN Musculoskeletal:        General: Normal range of motion.     Cervical back: Normal range of motion and neck supple.  Skin:    General: Skin is warm and dry.  Neurological:     General: No focal  deficit present.     Mental Status: She is alert and oriented to person, place, and time.  Psychiatric:        Mood and Affect: Mood normal.        Behavior: Behavior normal.   BP 124/72   Pulse 85   Temp 98.3 F (36.8 C) (Temporal)   Ht 5\' 6"  (1.676 m)   Wt (!) 313 lb 6.4 oz (142.2 kg)   SpO2 98%   BMI 50.58 kg/m  Wt Readings from Last 3 Encounters:  05/27/21 (!) 313 lb 6.4 oz (142.2 kg)  02/27/21 (!) 314 lb 5 oz (142.6 kg)  12/18/20 (!) 317 lb 6.1 oz (144 kg)     Health Maintenance Due  Topic Date Due  . Pneumococcal Vaccine 81-109 Years old (1 - PCV) Never done  . Hepatitis C Screening  Never done  . Zoster Vaccines- Shingrix (1 of 2) Never done  . MAMMOGRAM  01/18/2020    There are no preventive care reminders to display for this patient.  Lab Results  Component Value Date   TSH 1.65 12/18/2020   Lab Results  Component Value Date   WBC 6.8 12/18/2020   HGB 13.4 12/18/2020   HCT 39.7 12/18/2020   MCV 88.4 12/18/2020   PLT 245.0 12/18/2020   Lab Results  Component Value Date   NA 139 01/15/2021   K 3.9 01/15/2021   CO2 32 01/15/2021   GLUCOSE 131 (H) 01/15/2021   BUN 11 01/15/2021   CREATININE 1.05 01/15/2021   BILITOT 0.5 12/18/2020   ALKPHOS 116 12/18/2020   AST 35 12/18/2020   ALT 42 (H) 12/18/2020   PROT 7.1 12/18/2020   ALBUMIN 4.2 12/18/2020   CALCIUM 9.9 01/15/2021   GFR 61.37 01/15/2021   Lab Results  Component Value Date   CHOL 182 06/03/2020   Lab Results  Component Value Date   HDL 43 06/03/2020   Lab Results  Component Value Date   LDLCALC 123 (H) 06/03/2020   Lab Results  Component Value Date   TRIG 89 06/03/2020   Lab Results  Component Value Date   CHOLHDL 4.0 02/08/2020   Lab Results  Component Value Date   HGBA1C 5.5 06/03/2020      Assessment & Plan:   Problem List Items Addressed This Visit   None Visit Diagnoses     Encounter for screening mammogram for malignant neoplasm of breast    -  Primary    Relevant Orders   Mammogram Digital Screening   Morbid obesity ([place])       Relevant Orders   Comprehensive metabolic panel   TSH   Lipid panel   Routine general medical examination at a health care facility       Relevant Orders   Mammogram Digital Screening   Comprehensive metabolic panel   CBC with Differential   TSH   Lipid panel   Vitamin B 12 deficiency       Relevant Orders   B12   Family history of B12 deficiency       Relevant Medications   cyanocobalamin (,VITAMIN B-12,) 1000 MCG/ML injection       Meds ordered this encounter  Medications  . cyanocobalamin (,VITAMIN B-12,) 1000 MCG/ML injection    Sig: Inject 1 mL (1,000 mcg total) into the muscle every 30 (thirty) days.    Dispense:  3 mL    Refill:  2    Follow-up: Return in 6 months (on 11/26/2021).  Mammogram referral placed placed.  Will notify patient pending results.  Encourage patient to call and get her colonoscopy scheduled.  She would also like to consider an appointment back to bariatrics however, she has 2 upcoming appointments with neurology and wants to get through those prior to making any additional appointments at this time.  Advised to call back when she is ready and I will place a referral.  Encouraged healthy diet, exercise follow-up in 6 months and sooner as needed.   Kennyth Arnold, FNP

## 2021-05-28 ENCOUNTER — Other Ambulatory Visit: Payer: Self-pay | Admitting: Family

## 2021-05-28 DIAGNOSIS — R739 Hyperglycemia, unspecified: Secondary | ICD-10-CM

## 2021-05-28 DIAGNOSIS — R7989 Other specified abnormal findings of blood chemistry: Secondary | ICD-10-CM

## 2021-05-28 LAB — VITAMIN B12: Vitamin B-12: 272 pg/mL (ref 211–911)

## 2021-05-28 LAB — COMPREHENSIVE METABOLIC PANEL
ALT: 40 U/L — ABNORMAL HIGH (ref 0–35)
AST: 39 U/L — ABNORMAL HIGH (ref 0–37)
Albumin: 4 g/dL (ref 3.5–5.2)
Alkaline Phosphatase: 144 U/L — ABNORMAL HIGH (ref 39–117)
BUN: 8 mg/dL (ref 6–23)
CO2: 28 mEq/L (ref 19–32)
Calcium: 9.1 mg/dL (ref 8.4–10.5)
Chloride: 103 mEq/L (ref 96–112)
Creatinine, Ser: 0.94 mg/dL (ref 0.40–1.20)
GFR: 69.91 mL/min (ref >60.00)
Glucose, Bld: 162 mg/dL — ABNORMAL HIGH (ref 70–99)
Potassium: 3.8 mEq/L (ref 3.5–5.1)
Sodium: 139 mEq/L (ref 135–145)
Total Bilirubin: 0.4 mg/dL (ref 0.2–1.2)
Total Protein: 7 g/dL (ref 6.0–8.3)

## 2021-05-28 LAB — CBC WITH DIFFERENTIAL/PLATELET
Basophils Absolute: 0.1 10*3/uL (ref 0.0–0.1)
Basophils Relative: 1.1 % (ref 0.0–3.0)
Eosinophils Absolute: 0.3 10*3/uL (ref 0.0–0.7)
Eosinophils Relative: 4.4 % (ref 0.0–5.0)
HCT: 38.1 % (ref 36.0–46.0)
Hemoglobin: 12.9 g/dL (ref 12.0–15.0)
Lymphocytes Relative: 36.9 % (ref 12.0–46.0)
Lymphs Abs: 2.6 10*3/uL (ref 0.7–4.0)
MCHC: 34 g/dL (ref 30.0–36.0)
MCV: 89.1 fl (ref 78.0–100.0)
Monocytes Absolute: 0.6 10*3/uL (ref 0.1–1.0)
Monocytes Relative: 9 % (ref 3.0–12.0)
Neutro Abs: 3.4 10*3/uL (ref 1.4–7.7)
Neutrophils Relative %: 48.6 % (ref 43.0–77.0)
Platelets: 223 10*3/uL (ref 150.0–400.0)
RBC: 4.27 Mil/uL (ref 3.87–5.11)
RDW: 14.4 % (ref 11.5–15.5)
WBC: 7.1 10*3/uL (ref 4.0–10.5)

## 2021-05-28 LAB — LIPID PANEL
Cholesterol: 175 mg/dL (ref 0–200)
HDL: 45.7 mg/dL (ref >39.00)
NonHDL: 129.76
Total CHOL/HDL Ratio: 4
Triglycerides: 266 mg/dL — ABNORMAL HIGH (ref 0.0–149.0)
VLDL: 53.2 mg/dL — ABNORMAL HIGH (ref 0.0–40.0)

## 2021-05-28 LAB — HEMOGLOBIN A1C: Hgb A1c MFr Bld: 7.4 % — ABNORMAL HIGH (ref 4.6–6.5)

## 2021-05-28 LAB — LDL CHOLESTEROL, DIRECT: Direct LDL: 104 mg/dL

## 2021-05-28 LAB — TSH: TSH: 1.67 u[IU]/mL (ref 0.35–4.50)

## 2021-05-29 ENCOUNTER — Inpatient Hospital Stay: Admission: RE | Admit: 2021-05-29 | Payer: 59 | Source: Ambulatory Visit

## 2021-05-29 ENCOUNTER — Other Ambulatory Visit: Payer: 59

## 2021-05-30 ENCOUNTER — Encounter: Payer: Self-pay | Admitting: Physician Assistant

## 2021-06-05 ENCOUNTER — Other Ambulatory Visit: Payer: Self-pay

## 2021-06-05 ENCOUNTER — Other Ambulatory Visit: Payer: Self-pay | Admitting: Family

## 2021-06-05 ENCOUNTER — Telehealth (INDEPENDENT_AMBULATORY_CARE_PROVIDER_SITE_OTHER): Payer: 59 | Admitting: Family

## 2021-06-05 ENCOUNTER — Encounter: Payer: Self-pay | Admitting: Family

## 2021-06-05 VITALS — Ht 66.0 in | Wt 313.5 lb

## 2021-06-05 DIAGNOSIS — R7303 Prediabetes: Secondary | ICD-10-CM

## 2021-06-05 DIAGNOSIS — E1165 Type 2 diabetes mellitus with hyperglycemia: Secondary | ICD-10-CM | POA: Diagnosis not present

## 2021-06-05 DIAGNOSIS — Z6841 Body Mass Index (BMI) 40.0 and over, adult: Secondary | ICD-10-CM

## 2021-06-05 DIAGNOSIS — R7989 Other specified abnormal findings of blood chemistry: Secondary | ICD-10-CM | POA: Diagnosis not present

## 2021-06-05 MED ORDER — METFORMIN HCL 500 MG PO TABS: 500.0000 mg | ORAL_TABLET | Freq: Two times a day (BID) | ORAL | 1 refills | Status: DC

## 2021-06-05 NOTE — Progress Notes (Addendum)
Virtual Visit via Telephone Note  I connected with Erin Jimenez on 06/05/21 at  3:45 PM EDT by telephone and verified that I am speaking with the correct person using two identifiers.  Location: Patient: Home Provider: [place]   I discussed the limitations, risks, security and privacy concerns of performing an evaluation and management service by telephone and the availability of in person appointments. I also discussed with the patient that there may be a patient responsible charge related to this service. The patient expressed understanding and agreed to proceed.   History of Present Illness:Erin Jimenez presents for a follow-up of her last office visit.  She was found to have a hemoglobin A1c of 7.4 and elevated liver function test.  Patient currently takes 250 mg of metformin twice a day and tolerates it well.  Does not routinely exercise.  Overall, patient feels much better with regards to fatigue after her B12 injection.    Observations/Objective: Alert and oriented no acute distress   Assessment and Plan:[name] was seen today for lab results.  Diagnoses and all orders for this visit:  Type 2 diabetes mellitus with hyperglycemia, without long-term current use of insulin (HCC)  Class 3 severe obesity with serious comorbidity and body mass index (BMI) of 50.0 to 59.9 in adult, unspecified obesity type (HCC)  Elevated liver function tests   Increase metformin to 500 mg twice a day for better glycemic control.  Elevated liver function tests are likely due to fatty liver disease.  However, we will order an ultrasound of the liver to confirm. Will likely need to consider a statin medication. Return for a recheck in 3-6 months.    Visit time: 15 minutes   Kennyth Arnold, FNP

## 2021-06-08 ENCOUNTER — Ambulatory Visit
Admission: RE | Admit: 2021-06-08 | Discharge: 2021-06-08 | Disposition: A | Discharge status: Home / Self Care | Payer: 59 | Source: Ambulatory Visit | Attending: Neurosurgery | Admitting: Neurosurgery

## 2021-06-08 ENCOUNTER — Other Ambulatory Visit: Payer: Self-pay

## 2021-06-08 DIAGNOSIS — G935 Compression of brain: Secondary | ICD-10-CM

## 2021-06-08 DIAGNOSIS — G500 Trigeminal neuralgia: Secondary | ICD-10-CM

## 2021-06-08 MED ORDER — GADOBENATE DIMEGLUMINE 529 MG/ML IV SOLN
20.0000 mL | Freq: Once | INTRAVENOUS | Status: AC | PRN
  Administered 2021-06-08: 20 mL via INTRAVENOUS

## 2021-06-18 ENCOUNTER — Other Ambulatory Visit: Payer: Self-pay | Admitting: Internal Medicine

## 2021-06-20 ENCOUNTER — Other Ambulatory Visit: Payer: Self-pay | Admitting: Physician Assistant

## 2021-06-24 ENCOUNTER — Encounter: Payer: Self-pay | Admitting: Neurology

## 2021-07-01 ENCOUNTER — Other Ambulatory Visit: Payer: Self-pay | Admitting: Physician Assistant

## 2021-07-01 DIAGNOSIS — R7303 Prediabetes: Secondary | ICD-10-CM

## 2021-08-11 ENCOUNTER — Other Ambulatory Visit: Payer: Self-pay | Admitting: Neurology

## 2021-09-17 ENCOUNTER — Other Ambulatory Visit: Payer: Self-pay | Admitting: *Deleted

## 2021-09-17 ENCOUNTER — Encounter: Payer: Self-pay | Admitting: Neurology

## 2021-09-17 DIAGNOSIS — F32A Depression, unspecified: Secondary | ICD-10-CM

## 2021-09-18 ENCOUNTER — Other Ambulatory Visit: Payer: Self-pay | Admitting: Family Medicine

## 2021-09-18 ENCOUNTER — Other Ambulatory Visit: Payer: Self-pay | Admitting: Internal Medicine

## 2021-09-25 ENCOUNTER — Other Ambulatory Visit: Payer: Self-pay | Admitting: Neurology

## 2021-09-25 MED ORDER — DESVENLAFAXINE ER 50 MG PO TB24: 1.0000 | ORAL_TABLET | Freq: Every day | ORAL | 1 refills | Status: DC

## 2021-11-09 ENCOUNTER — Other Ambulatory Visit: Payer: Self-pay | Admitting: Neurology

## 2021-11-09 ENCOUNTER — Other Ambulatory Visit: Payer: Self-pay | Admitting: Family Medicine

## 2021-11-10 ENCOUNTER — Other Ambulatory Visit: Payer: Self-pay | Admitting: Neurology

## 2021-11-10 MED ORDER — FUROSEMIDE 40 MG PO TABS: 40.0000 mg | ORAL_TABLET | Freq: Every day | ORAL | 0 refills | Status: DC

## 2021-11-10 NOTE — Telephone Encounter (Signed)
Received refill request for Xanax.  Last OV was on 02/27/21.  Next OV is scheduled for 03/02/22 .  Last RX was written on 08/12/21 for 45 tabs.   [facility] has been reviewed.

## 2021-11-11 MED ORDER — ALPRAZOLAM 0.5 MG PO TABS: ORAL_TABLET | ORAL | 0 refills | Status: DC

## 2021-11-11 NOTE — Telephone Encounter (Signed)
Rx was sent yesterday for provider's approval.

## 2021-11-30 ENCOUNTER — Other Ambulatory Visit: Payer: Self-pay | Admitting: Family

## 2021-11-30 ENCOUNTER — Other Ambulatory Visit: Payer: Self-pay | Admitting: Neurology

## 2021-11-30 DIAGNOSIS — Z8349 Family history of other endocrine, nutritional and metabolic diseases: Secondary | ICD-10-CM

## 2021-11-30 DIAGNOSIS — R7303 Prediabetes: Secondary | ICD-10-CM

## 2021-12-31 ENCOUNTER — Encounter: Payer: Self-pay | Admitting: Neurology

## 2022-01-10 ENCOUNTER — Encounter: Payer: Self-pay | Admitting: Neurology

## 2022-01-12 ENCOUNTER — Other Ambulatory Visit: Payer: Self-pay | Admitting: Neurology

## 2022-01-12 MED ORDER — DESVENLAFAXINE ER 50 MG PO TB24: 1.0000 | ORAL_TABLET | Freq: Every day | ORAL | 1 refills | Status: DC

## 2022-02-09 ENCOUNTER — Other Ambulatory Visit: Payer: Self-pay | Admitting: Neurology

## 2022-02-09 ENCOUNTER — Encounter: Payer: Self-pay | Admitting: Neurology

## 2022-02-09 MED ORDER — ALPRAZOLAM 0.5 MG PO TABS: ORAL_TABLET | ORAL | 0 refills | Status: DC

## 2022-02-25 ENCOUNTER — Encounter: Payer: Self-pay | Admitting: Neurology

## 2022-03-02 ENCOUNTER — Other Ambulatory Visit: Payer: Self-pay | Admitting: Physician Assistant

## 2022-03-02 ENCOUNTER — Ambulatory Visit (INDEPENDENT_AMBULATORY_CARE_PROVIDER_SITE_OTHER): Payer: 59 | Admitting: Neurology

## 2022-03-02 ENCOUNTER — Encounter: Payer: Self-pay | Admitting: Neurology

## 2022-03-02 VITALS — BP 148/84 | HR 88 | Ht 66.0 in | Wt 300.0 lb

## 2022-03-02 DIAGNOSIS — Z7189 Other specified counseling: Secondary | ICD-10-CM | POA: Diagnosis not present

## 2022-03-02 DIAGNOSIS — G4731 Primary central sleep apnea: Secondary | ICD-10-CM | POA: Diagnosis not present

## 2022-03-02 DIAGNOSIS — G96198 Other disorders of meninges, not elsewhere classified: Secondary | ICD-10-CM

## 2022-03-02 DIAGNOSIS — M5481 Occipital neuralgia: Secondary | ICD-10-CM

## 2022-03-02 DIAGNOSIS — Q0701 Arnold-Chiari syndrome with spina bifida: Secondary | ICD-10-CM

## 2022-03-02 DIAGNOSIS — E662 Morbid (severe) obesity with alveolar hypoventilation: Secondary | ICD-10-CM

## 2022-03-02 MED ORDER — TRAZODONE HCL 50 MG PO TABS: 50.0000 mg | ORAL_TABLET | Freq: Every day | ORAL | 1 refills | Status: DC

## 2022-03-02 MED ORDER — DESVENLAFAXINE ER 50 MG PO TB24: 1.0000 | ORAL_TABLET | Freq: Every day | ORAL | 1 refills | Status: DC

## 2022-03-02 NOTE — Patient Instructions (Signed)
Sleep apnea, central ;on ASV  ?Obesity-Hypoventilation Syndrome ?Obesity-hypoventilation syndrome (OHS) is a condition in which a person cannot efficiently move air in and out of the lungs (ventilate). This condition causes a buildup of carbon dioxidelevels in the blood and a drop in oxygen levels. ?OHS can increase the risk for: ?Cor pulmonale, or right-sided heart failure. ?Left-sided heart failure. ?Pulmonary hypertension, or high blood pressure in the arteries in the lungs. ?Too many red blood cells in the body. ?Disability or death. ?What are the causes? ?This condition may be caused by: ?Being obese with a BMI (body mass index) greater than or equal to 30 kg/m2. ?Having too much fat around the abdomen, chest, and neck. ?The brain being unable to properly manage the high carbon dioxide and low oxygen levels. ?Hormones made by fat cells. These hormones may interfere with breathing. ?Sleep apnea. This is when breathing stops, pauses, or is shallow during sleep. ?What are the signs or symptoms? ?Symptoms of this condition include: ?Feeling sleepy during the day. ?Headaches. These may be worse in the morning. ?Shortness of breath. ?Snoring, choking, gasping, or trouble breathing during sleep. ?Poor concentration or poor memory. ?Mood changes or feeling irritable. ?Depression. ?How is this diagnosed? ?This condition may be diagnosed by: ?BMI measurement. ?Blood tests to measure blood levels of serum bicarbonate, carbon dioxide, and oxygen. ?Pulse oximetry to measure the amount of oxygen in your blood. This uses a small device that is placed on your finger, earlobe, or toe. ?Polysomnogram, or sleep study, to check your breathing patterns and levels of oxygen and carbon dioxide while you sleep. ?You may also have other tests, including: ?A chest X-ray to rule out other breathing problems. ?Lung tests, or pulmonary function tests, to rule out other breathing problems. ?Electrocardiogram (ECG) or echocardiogram to check  for signs of heart failure. ?How is this treated? ?This condition may be treated with: ?A device such as a continuous positive airway pressure (CPAP) machine or a bi-level positive airway pressure (BIPAP) machine. These devices deliver pressure and sometimes oxygen to make breathing easier. A mask may be placed over your nose or mouth. ?Oxygen if your blood oxygen levels are low. ?A weight-loss program. ?Bariatric, or weight-loss, surgery. ?Tracheostomy. A tube is placed in the windpipe through the neck to help with breathing. ?Follow these instructions at home: ?Medicines ?Take over-the-counter and prescription medicines only as told by your health care provider. ?Ask your health care provider what medicines are safe for you. You may be told to avoid medicines such as sedatives and narcotics. These can affect breathing and make OHS worse. ?Sleeping habits ?If you are prescribed a CPAP or a BIPAP machine, make sure you understand how to use it. Use your CPAP or BIPAP machine only as told by your health care provider. ?Try to get at least 8 hours of sleep every night. ?Eating and drinking ? ?Eat foods that are high in fiber, such as beans, whole grains, and fresh fruits and vegetables. ?Limit foods that are high in fat and processed sugars, such as fried or sweet foods. ?Drink enough fluid to keep your urine pale yellow. ?Do not drink alcohol if: ?Your health care provider tells you not to drink. ?You are pregnant, may be pregnant, or are planning to become pregnant. ?General instructions ?Follow a diet and exercise plan that helps you reach and keep a healthy weight as told by your health care provider. ?Exercise regularly as told by your health care provider. ?Do not use any products that contain  nicotine or tobacco. These products include cigarettes, chewing tobacco, and vaping devices, such as e-cigarettes. If you need help quitting, ask your health care provider. ?Keep all follow-up visits. This is  important. ?Contact a health care provider if: ?You develop new or worsening shortness of breath. ?You are having trouble waking up or staying awake. ?You are confused. ?You develop a cough. ?You have a fever. ?Get help right away if: ?You have chest pain. ?You have fast or irregular heartbeats. ?You are dizzy or you faint. ?You have any symptoms of a stroke. "BE FAST" is an easy way to remember the main warning signs of a stroke: ?B - Balance. Signs are dizziness, sudden trouble walking, or loss of balance. ?E - Eyes. Signs are trouble seeing or a sudden change in vision. ?F - Face. Signs are sudden weakness or numbness of the face, or the face or eyelid drooping on one side. ?A - Arms. Signs are weakness or numbness in an arm. This happens suddenly and usually on one side of the body. ?S - Speech. Signs are sudden trouble speaking, slurred speech, or trouble understanding what people say. ?T - Time. Time to call emergency services. Write down what time symptoms started. ?You have other signs of a stroke, such as: ?A sudden, severe headache with no known cause. ?Nausea or vomiting. ?Seizure. ?These symptoms may be an emergency. Get help right away. Call 911. ?Do not wait to see if the symptoms will go away. ?Do not drive yourself to the hospital. ?Summary ?Obesity-hypoventilation syndrome (OHS) causes a buildup of carbon dioxidelevels in the blood and a drop in oxygen levels. ?OHS can increase the risk for heart failure, pulmonary hypertension, disability, and death. ?Follow your diet and exercise plan as told by your health care provider. ?Get help right away if you have any symptoms of a stroke. ?This information is not intended to replace advice given to you by your health care provider. Make sure you discuss any questions you have with your health care provider. ?Document Revised: 07/08/2021 Document Reviewed: 07/08/2021 ?Elsevier Patient Education ? [place]. ?Screening for Sleep Apnea ?Sleep apnea is  a condition in which breathing pauses or becomes shallow during sleep. Sleep apnea screening is a test to determine if you are at risk for sleep apnea. The test includes a series of questions. It will only takes a few minutes. Your health care provider may ask you to have this test in preparation for surgery or as part of a physical exam. ?What are the symptoms of sleep apnea? ?Common symptoms of sleep apnea include: ?Snoring. ?Waking up often at night. ?Daytime sleepiness. ?Pauses in breathing. ?Choking or gasping during sleep. ?Irritability. ?Forgetfulness. ?Trouble thinking clearly. ?Depression. ?Personality changes. ?Most people with sleep apnea do not know that they have it. ?What are the advantages of sleep apnea screening? ?Getting screened for sleep apnea can help: ?Ensure your safety. It is important for your health care providers to know whether or not you have sleep apnea, especially if you are having surgery or have other long-term (chronic) health conditions. ?Improve your health and allow you to get a better night's rest. Restful sleep can help you: ?Have more energy. ?Lose weight. ?Improve high blood pressure. ?Improve diabetes management. ?Prevent stroke. ?Prevent car accidents. ?What happens during the screening? ?Screening usually includes being asked a list of questions about your sleep quality. Some questions you may be asked include: ?Do you snore? ?Is your sleep restless? ?Do you have daytime sleepiness? ?Has a  partner or spouse told you that you stop breathing during sleep? ?Have you had trouble concentrating or memory loss? ?What is your age? ?What is your neck circumference? ?To measure your neck, keep your back straight and gently wrap the tape measure around your neck. Put the tape measure at the middle of your neck, between your chin and collarbone. ?What is your sex assigned at birth? ?Do you have or are you being treated for high blood pressure? ?If your screening test is positive, you  are at risk for the condition. Further testing may be needed to confirm a diagnosis of sleep apnea. ?Where to find more information ?You can find screening tools online or at your health care clinic. For more in
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
## 2022-05-19 ENCOUNTER — Other Ambulatory Visit: Payer: Self-pay | Admitting: Neurosurgery

## 2022-05-19 DIAGNOSIS — M4722 Other spondylosis with radiculopathy, cervical region: Secondary | ICD-10-CM

## 2022-05-25 ENCOUNTER — Other Ambulatory Visit: Payer: Self-pay | Admitting: Physician Assistant

## 2022-05-26 ENCOUNTER — Encounter: Payer: Self-pay | Admitting: Neurology

## 2022-05-26 ENCOUNTER — Other Ambulatory Visit: Payer: Self-pay | Admitting: Neurology

## 2022-05-26 MED ORDER — TRAZODONE HCL 50 MG PO TABS: 75.0000 mg | ORAL_TABLET | Freq: Every day | ORAL | 5 refills | Status: DC

## 2022-06-04 ENCOUNTER — Ambulatory Visit
Admission: RE | Admit: 2022-06-04 | Discharge: 2022-06-04 | Disposition: A | Discharge status: Home / Self Care | Payer: 59 | Source: Ambulatory Visit | Attending: Neurosurgery | Admitting: Neurosurgery

## 2022-06-04 DIAGNOSIS — M4722 Other spondylosis with radiculopathy, cervical region: Secondary | ICD-10-CM

## 2022-06-18 ENCOUNTER — Other Ambulatory Visit: Payer: Self-pay | Admitting: Neurology

## 2022-06-25 ENCOUNTER — Other Ambulatory Visit: Payer: Self-pay | Admitting: Internal Medicine

## 2022-07-05 ENCOUNTER — Other Ambulatory Visit: Payer: Self-pay | Admitting: Neurology

## 2022-07-05 ENCOUNTER — Other Ambulatory Visit: Payer: Self-pay | Admitting: Physician Assistant

## 2022-07-22 ENCOUNTER — Encounter (INDEPENDENT_AMBULATORY_CARE_PROVIDER_SITE_OTHER): Payer: Self-pay

## 2022-09-07 ENCOUNTER — Encounter: Payer: Self-pay | Admitting: *Deleted

## 2022-09-23 ENCOUNTER — Other Ambulatory Visit: Payer: Self-pay | Admitting: Physician Assistant

## 2022-09-23 DIAGNOSIS — Z8349 Family history of other endocrine, nutritional and metabolic diseases: Secondary | ICD-10-CM

## 2022-10-04 ENCOUNTER — Other Ambulatory Visit: Payer: Self-pay | Admitting: Physician Assistant

## 2022-11-15 ENCOUNTER — Other Ambulatory Visit: Payer: Self-pay | Admitting: Physician Assistant

## 2022-11-26 ENCOUNTER — Encounter: Payer: Self-pay | Admitting: *Deleted

## 2022-12-13 ENCOUNTER — Other Ambulatory Visit: Payer: Self-pay | Admitting: Neurology

## 2023-02-14 ENCOUNTER — Other Ambulatory Visit: Payer: Self-pay | Admitting: Neurology

## 2023-02-16 ENCOUNTER — Telehealth: Payer: 59 | Admitting: Physician Assistant

## 2023-02-16 ENCOUNTER — Telehealth: Payer: Self-pay | Admitting: Physician Assistant

## 2023-02-16 ENCOUNTER — Ambulatory Visit
Admission: RE | Admit: 2023-02-16 | Discharge: 2023-02-16 | Disposition: A | Discharge status: Home / Self Care | Payer: 59 | Source: Ambulatory Visit | Attending: Nurse Practitioner | Admitting: Nurse Practitioner

## 2023-02-16 VITALS — BP 142/88 | HR 78 | Temp 98.6°F | Resp 18

## 2023-02-16 DIAGNOSIS — R109 Unspecified abdominal pain: Secondary | ICD-10-CM

## 2023-02-16 DIAGNOSIS — M545 Low back pain, unspecified: Secondary | ICD-10-CM

## 2023-02-16 DIAGNOSIS — R300 Dysuria: Secondary | ICD-10-CM | POA: Diagnosis not present

## 2023-02-16 LAB — POCT URINALYSIS DIP (MANUAL ENTRY)
Bilirubin, UA: NEGATIVE
Blood, UA: NEGATIVE
Glucose, UA: NEGATIVE mg/dL
Ketones, POC UA: NEGATIVE mg/dL
Leukocytes, UA: NEGATIVE
Nitrite, UA: NEGATIVE
Protein Ur, POC: NEGATIVE mg/dL
Spec Grav, UA: 1.015 (ref 1.010–1.025)
Urobilinogen, UA: 0.2 E.U./dL
pH, UA: 6 (ref 5.0–8.0)

## 2023-02-16 MED ORDER — NAPROXEN 375 MG PO TABS: 375.0000 mg | ORAL_TABLET | Freq: Two times a day (BID) | ORAL | 0 refills | Status: AC

## 2023-02-16 NOTE — Telephone Encounter (Signed)
Spoke to pt told her Aldona Bar said need to go to Urgent Care. I see you have an appt this evening. Pt said yes. Please make sure you keep your appt. Pt verbalized understanding.  Samantha aware.

## 2023-02-16 NOTE — Telephone Encounter (Signed)
Please have pt triaged.

## 2023-02-16 NOTE — Discharge Instructions (Signed)
The clinic will contact you with results of your urine culture if it is positive Naproxen twice daily for 5 days.  Please stay hydrated while you are on this medication Heat to your back as needed Rest Follow-up with your PCP if symptoms do not improve Please go to the emergency room for any worsening symptoms

## 2023-02-16 NOTE — Telephone Encounter (Signed)
Patient having abdominal pain, side pain and back pain. Feeling very nauseous has not vomited yet.Describes her pain at a level 5 at the moment.

## 2023-02-16 NOTE — Progress Notes (Signed)
Because of nurse triage note today mentioning abdominal pain, side pain and back pain in addition to these urinary symptoms, raising concern for a kidney infection, I feel your condition warrants further evaluation and I recommend that you be seen in a face to face visit.   NOTE: There will be NO CHARGE for this eVisit   If you are having a true medical emergency please call 911.      For an urgent face to face visit, [facility] has eight urgent care centers for your convenience:   NEW!! [place] [facility] [place] [facility] [place] Get Driving Directions [id number] [address] [place], [place] [facility] [place] [facility] [place] Get Driving Directions [id number] [place], [place] [address]   [place] [facility] [place]) Get Driving Directions [id number] [address], [place] [address]  [place] [facility] [place] ([place]) Get Driving Directions [id number] [address] [place],  [place]  [address]  [place] [facility] - at [address] [place] Get Driving Directions  [id number] [place],  [place] [address]   [facility] Get Driving Directions [id number] [address] [place], [place], [place] [address]   [facility] Get Driving Directions  [id number] [address] [place],  [address]   [facility] [place] Get Driving Directions [id number] [address], [place],  [address]  Your MyChart E-visit questionnaire answers were reviewed by a board certified advanced clinical practitioner to complete your personal care plan based on your specific symptoms.  Thank you for using e-Visits.

## 2023-02-16 NOTE — Telephone Encounter (Signed)
Forgot to add pt triaged :)

## 2023-02-16 NOTE — Telephone Encounter (Signed)
FYI, see Triage note. 

## 2023-02-16 NOTE — Telephone Encounter (Signed)
Final Disposition was for pt to be seen by HCP within 4 hours. Patient has had E-visit with Raiford Noble, PA-C.   Patient Name: Erin Jimenez Gender: Female DOB: Oct 06, 1971 Age: 52 Y 11 M 7 D Return Phone Number: [phone] (Primary) Address: City/ State/ Zip: [place]  [address] Client [place] [facility] [place] Client Site [place] [facility] [place] Day Provider Morene Rankins, [place]- PA Contact Type Call Who Is Calling Patient / Member / Family / Caregiver Call Type Triage / Clinical Relationship To Patient Self Return Phone Number [phone] (Primary) Chief Complaint Abdominal Pain Reason for Call Symptomatic / Request for [place] states she is having back pain, side pain, abdominal pain and she has nausea. Translation No Nurse Assessment Nurse: Ysidro Evert, RN, Levada Dy Date/Time (Eastern Time): 02/16/2023 9:55:48 AM Confirm and document reason for call. If symptomatic, describe symptoms. ---Caller states she is having flank pain that started yesterday. She feels nauseated. She has burning with urination first thing in the morning. No fever Does the patient have any new or worsening symptoms? ---Yes Will a triage be completed? ---Yes Related visit to physician within the last 2 weeks? ---No Does the PT have any chronic conditions? (i.e. diabetes, asthma, this includes High risk factors for pregnancy, etc.) ---No Is the patient pregnant or possibly pregnant? (Ask all females between the ages of 71-55) ---No Is this a behavioral health or substance abuse call? ---No Guidelines Guideline Title Affirmed Question Affirmed Notes Nurse Date/Time Eilene Ghazi Time) Flank Pain Pain or burning with passing urine (urination) Ysidro Evert, RN, Levada Dy 02/16/2023 9:56:52 AM Disp. Time Eilene Ghazi Time) Disposition Final User 02/16/2023 10:02:38 AM See HCP within 4 Hours (or PCP triage) Yes Ysidro Evert, RN, Levada Dy PLEASE  NOTE: All timestamps contained within this report are represented as [place] Standard Time. CONFIDENTIALTY NOTICE: This fax transmission is intended only for the addressee. It contains information that is legally privileged, confidential or otherwise protected from use or disclosure. If you are not the intended recipient, you are strictly prohibited from reviewing, disclosing, copying using or disseminating any of this information or taking any action in reliance on or regarding this information. If you have received this fax in error, please notify us immediately by telephone so that we can arrange for its return to [place]. Phone: [phone], Toll-Free: [phone], Fax: [phone] Page: 2 of 2 Call Id: [id number] Final Disposition 02/16/2023 10:02:38 AM See HCP within 4 Hours (or PCP triage) Yes Ysidro Evert, RN, [place] Shutter Disagree/Comply Comply Caller Understands Yes PreDisposition Did not know what to do Care Advice Given Per Guideline SEE HCP (OR PCP TRIAGE) WITHIN 4 HOURS: * IF OFFICE WILL BE OPEN: You need to be seen within the next 3 or 4 hours. Call your doctor (or NP/PA) now or as soon as the office opens. DRINK EXTRA FLUIDS: * Drink extra fluids. CALL BACK IF: * You become worse CARE ADVICE given per Flank Pain (Adult) guideline. Referrals [place] [facility] [place] [facility] [place]

## 2023-02-16 NOTE — ED Provider Notes (Signed)
[facility]    CSN: [id number] Arrival date & time: 02/16/23  1747      History   Chief Complaint Chief Complaint  Patient presents with   Urinary Retention    Pain in lower right side/back. Burning during urination - Entered by patient    HPI Erin Jimenez is a 52 y.o. female presents for evaluation of dysuria.  Patient reports yesterday she developed malodorous urine and burning with urination.  Denies urgency, frequency, hematuria, fevers, vomiting.  She does endorse some some mild nausea.  She also states she has some right lower back pain that began yesterday and does not radiate.  It is an aching type pain that is worse with movement.  Denies any numbness/tingling/weakness of her lower extremities, no bowel or bladder incontinence, no saddle paresthesia.  Denies history of pyelonephritis.  Denies any injury or known inciting events of her back pain.  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  No history of back fractures or surgeries.  She has not used any OTC medications for symptoms.  No other concerns at this time.  HPI  Past Medical History:  Diagnosis Date   Allergy    Anxiety    B12 deficiency    Chiari malformation    Chronic headache    Depression    Endometriosis    Female infertility    GERD (gastroesophageal reflux disease)    controlled with nexium   Insomnia    Iron deficiency anemia    Irritable bowel syndrome    constipation   Lower extremity edema    Obesity    PCOS (polycystic ovarian syndrome)    Prediabetes    Pseudomeningocele, acquired    Sleep apnea    compliant with Bi-PAP   Swallowing difficulty    had GI work-up; was told that is was a brain-stem issue    Patient Active Problem List   Diagnosis Date Noted   Treatment-emergent central sleep apnea 09/10/2020   Encounter for counseling on adaptive servo-ventilation (ASV) use 09/10/2020   Iron deficiency anemia 05/09/2020   Perennial  allergic rhinitis 05/17/2019   Menopausal symptom 02/14/2019   Occipital neuralgia of right side 09/29/2017   Central sleep apnea syndrome 04/26/2015   Hypoventilation associated with obesity syndrome ([place]) 04/26/2015   OSA on CPAP 02/05/2015   Obesity hypoventilation syndrome ([place]) 12/25/2014   Depression (emotion) 12/25/2014   Hypersomnia with sleep apnea 12/25/2014   Chronic tension-type headache, intractable 12/25/2014   Arnold-Chiari malformation, type II ([name]) 12/25/2014   Dysphagia 01/10/2013   IBS (irritable bowel syndrome) 01/10/2013   GERD (gastroesophageal reflux disease) 02/10/2012   Allergic rhinitis due to pollen 02/10/2012   Insomnia 02/10/2012   B12 deficiency 02/10/2012   Pseudomeningocele, acquired 06/11/2011   Chronic constipation 02/10/2008   Endometriosis 02/10/2008    Past Surgical History:  Procedure Laterality Date   ABDOMINAL HYSTERECTOMY  2001   due to endometriosis   BRAIN SURGERY     Chiari malformation   CHOLECYSTECTOMY     COLONOSCOPY     ERCP  2000   ESOPHAGOGASTRODUODENOSCOPY     LAPAROSCOPIC GASTRIC BANDING  2006   SPINAL FUSION      OB History     Gravida  0   Para  0   Term  0   Preterm  0   AB  0   Living  0      SAB  0  IAB  0   Ectopic  0   Multiple  0   Live Births  0            Home Medications    Prior to Admission medications   Medication Sig Start Date End Date Taking? Authorizing Provider  naproxen (NAPROSYN) 375 MG tablet Take 1 tablet (375 mg total) by mouth 2 (two) times daily for 5 days. 02/16/23 02/21/23 Yes Melynda Ripple, NP  ALPRAZolam Duanne Moron) 0.5 MG tablet TAKE 1/2 TABLET (0.25 MG TOTAL) BY MOUTH AT BEDTIME AS NEEDED FOR ANXIETY. 02/09/22   Dohmeier, Asencion Partridge, MD  cholecalciferol (VITAMIN D) 1000 UNITS tablet Take 4,000 Units by mouth daily.     [provider]  cyanocobalamin (VITAMIN B12) 1000 MCG/ML injection INJECT 1 ML (1,000 MCG) INTRAMUSCULARLY EVERY 30 DAYS 09/23/22   Inda Coke, PA  cyclobenzaprine (FLEXERIL) 10 MG tablet Take 10 mg by mouth at bedtime.    [provider]  desvenlafaxine (PRISTIQ) 50 MG 24 hr tablet TAKE 1 TABLET BY MOUTH EVERY DAY 02/16/23   Dohmeier, Asencion Partridge, MD  esomeprazole (NEXIUM) 40 MG capsule Take 1 capsule (40 mg total) by mouth daily before breakfast. 01/07/17   Gatha Khing Belcher, MD  fluticasone [facility]) 50 MCG/ACT nasal spray Place 2 sprays into the nose daily as needed. Taking as needed 02/10/12 05/27/20  Leandrew Koyanagi, MD  furosemide (LASIX) 40 MG tablet TAKE 1 TABLET BY MOUTH EVERY DAY 11/16/22   Inda Coke, PA  HYDROcodone-acetaminophen (NORCO/VICODIN) 5-325 MG tablet Take 1 tablet by mouth every 8 (eight) hours as needed. 08/08/20   [provider]  LINZESS 145 MCG CAPS capsule TAKE 1 CAPSULE BY MOUTH EVERY DAY BEFORE BREAKFAST 06/25/22   Gatha Pleas Carneal, MD  Magnesium 250 MG TABS Take by mouth.    [provider]  metFORMIN (GLUCOPHAGE) 500 MG tablet TAKE 1 TABLET BY MOUTH 2 TIMES DAILY WITH A MEAL. 12/01/21   Inda Coke, PA  Potassium Chloride ER 20 MEQ TBCR Take 1 tablet by mouth daily in the afternoon.    [provider]  traZODone (DESYREL) 50 MG tablet TAKE 1 TABLET BY MOUTH AT BEDTIME FOR SLEEP 12/15/22   Dohmeier, Asencion Partridge, MD    Family History Family History  Problem Relation Age of Onset   Colon polyps Mother    Diabetes Mother    Brain cancer Mother    Depression Mother    Colon polyps Father    Non-Hodgkin's lymphoma Father    Bladder Cancer Father    Sleep apnea Father    Colon cancer Neg Hx    Rectal cancer Neg Hx    Stomach cancer Neg Hx     Social History Social History   Tobacco Use   Smoking status: Never   Smokeless tobacco: Never  Vaping Use   Vaping Use: Never used  Substance Use Topics   Alcohol use: Yes    Comment: occ   Drug use: No     Allergies   Ciprofloxacin, Doxycycline, Other, Oxycodone, and Sulfa antibiotics   Review of  Systems Review of Systems  Genitourinary:  Positive for dysuria.  Musculoskeletal:  Positive for back pain.     Physical Exam Triage Vital Signs ED Triage Vitals  Enc Vitals Group     BP 02/16/23 1800 (!) 142/88     Pulse Rate 02/16/23 1800 78     Resp 02/16/23 1800 18     Temp 02/16/23 1800 98.6 F (37 C)  Temp Source 02/16/23 1800 Oral     SpO2 02/16/23 1800 96 %     Weight --      Height --      Head Circumference --      Peak Flow --      Pain Score 02/16/23 1759 5     Pain Loc --      Pain Edu? --      Excl. in [place]? --    No data found.  Updated Vital Signs BP (!) 142/88 (BP Location: Right Arm)   Pulse 78   Temp 98.6 F (37 C) (Oral)   Resp 18   SpO2 96%   Visual Acuity Right Eye Distance:   Left Eye Distance:   Bilateral Distance:    Right Eye Near:   Left Eye Near:    Bilateral Near:     Physical Exam Vitals and nursing note reviewed.  Constitutional:      Appearance: Normal appearance.  HENT:     Head: Normocephalic and atraumatic.  Eyes:     Pupils: Pupils are equal, round, and reactive to light.  Cardiovascular:     Rate and Rhythm: Normal rate.  Pulmonary:     Effort: Pulmonary effort is normal.  Abdominal:     Tenderness: There is no right CVA tenderness or left CVA tenderness.  Musculoskeletal:     Cervical back: Normal.     Thoracic back: Normal.     Lumbar back: Tenderness present. No swelling, deformity, lacerations, spasms or bony tenderness.       Back:     Comments: Mildly tender to palpation to the right lower paralumbar spinal muscles.  Strength 5 out of 5 bilateral lower extremities.  Skin:    General: Skin is warm and dry.  Neurological:     General: No focal deficit present.     Mental Status: She is alert and oriented to person, place, and time.  Psychiatric:        Mood and Affect: Mood normal.        Behavior: Behavior normal.      UC Treatments / Results  Labs (all labs ordered are listed, but only  abnormal results are displayed) Labs Reviewed  URINE CULTURE  POCT URINALYSIS DIP (MANUAL ENTRY)    EKG   Radiology No results found.  Procedures Procedures (including critical care time)  Medications Ordered in UC Medications - No data to display  Initial Impression / Assessment and Plan / UC Course  I have reviewed the triage vital signs and the nursing notes.  Pertinent labs & imaging results that were available during my care of the patient were reviewed by me and considered in my medical decision making (see chart for details).     Reviewed exam and symptoms with patient.  UA negative for UTI.  No blood in urine so doubt kidney stone Will culture urine and contact if positive Discussed with patient musculoskeletal cause of symptoms and will treat with naproxen while awaiting urine culture Heat to the low back as needed Follow-up with PCP if symptoms do not improve ER precautions reviewed and patient verbalized understanding Final Clinical Impressions(s) / UC Diagnoses   Final diagnoses:  Dysuria  Acute right-sided low back pain without sciatica     Discharge Instructions      The clinic will contact you with results of your urine culture if it is positive Naproxen twice daily for 5 days.  Please stay hydrated while you are on  this medication Heat to your back as needed Rest Follow-up with your PCP if symptoms do not improve Please go to the emergency room for any worsening symptoms    ED Prescriptions     Medication Sig Dispense Auth. Provider   naproxen (NAPROSYN) 375 MG tablet Take 1 tablet (375 mg total) by mouth 2 (two) times daily for 5 days. 10 tablet Melynda Ripple, NP      PDMP not reviewed this encounter.   Melynda Ripple, NP 02/16/23 [phone]

## 2023-02-16 NOTE — ED Triage Notes (Signed)
Pt presents with c/o lower right back pain. States yesterday she started feeling nauseas. Pt also c/o painful urination and burning.

## 2023-02-17 LAB — URINE CULTURE

## 2023-03-03 ENCOUNTER — Ambulatory Visit (INDEPENDENT_AMBULATORY_CARE_PROVIDER_SITE_OTHER): Payer: 59 | Admitting: Adult Health

## 2023-03-03 ENCOUNTER — Encounter: Payer: Self-pay | Admitting: Adult Health

## 2023-03-03 VITALS — BP 136/76 | HR 75 | Ht 67.0 in | Wt 299.0 lb

## 2023-03-03 DIAGNOSIS — F32A Depression, unspecified: Secondary | ICD-10-CM

## 2023-03-03 DIAGNOSIS — F5104 Psychophysiologic insomnia: Secondary | ICD-10-CM | POA: Diagnosis not present

## 2023-03-03 DIAGNOSIS — G4731 Primary central sleep apnea: Secondary | ICD-10-CM | POA: Diagnosis not present

## 2023-03-03 DIAGNOSIS — Z9989 Dependence on other enabling machines and devices: Secondary | ICD-10-CM | POA: Diagnosis not present

## 2023-03-03 NOTE — Patient Instructions (Signed)
Continue using ASV nightly and greater than 4 hours each night Continue trazodone and pristiq  If your symptoms worsen or you develop new symptoms please let us know.

## 2023-03-03 NOTE — Progress Notes (Signed)
PATIENT: Erin Jimenez DOB: 05-08-1971  REASON FOR VISIT: follow up HISTORY FROM: patient PRIMARY NEUROLOGIST: Dr. Brett Fairy  Chief Complaint  Patient presents with   Follow-up    Pt in 5 Pt here for CPAP f/u Pt states CPAP going well Pt states no questions or concerns for today's visit      HISTORY OF PRESENT ILLNESS: Today 03/03/23:  Erin Jimenez is a 52 y.o. female with a history of OSA on ASV. Returns today for follow-up.She reports that ASV is working well. Doesn't like to sleep without it. Continues on Pristiq for depression and reports that it works well. Takes trazodone at bedtime to help with sleep and it works well. No longer takes xanax.        REVIEW OF SYSTEMS: Out of a complete 14 system review of symptoms, the patient complains only of the following symptoms, and all other reviewed systems are negative.  ESS 3  ALLERGIES: Allergies  Allergen Reactions   Ciprofloxacin Nausea Only   Doxycycline Nausea Only   Other Nausea Only   Oxycodone Nausea Only   Sulfa Antibiotics Nausea Only    HOME MEDICATIONS: Outpatient Medications Prior to Visit  Medication Sig Dispense Refill   ALPRAZolam (XANAX) 0.5 MG tablet TAKE 1/2 TABLET (0.25 MG TOTAL) BY MOUTH AT BEDTIME AS NEEDED FOR ANXIETY. 45 tablet 0   cholecalciferol (VITAMIN D) 1000 UNITS tablet Take 4,000 Units by mouth daily.      cyanocobalamin (VITAMIN B12) 1000 MCG/ML injection INJECT 1 ML (1,000 MCG) INTRAMUSCULARLY EVERY 30 DAYS 3 mL 2   cyclobenzaprine (FLEXERIL) 10 MG tablet Take 10 mg by mouth at bedtime.     desvenlafaxine (PRISTIQ) 50 MG 24 hr tablet TAKE 1 TABLET BY MOUTH EVERY DAY 90 tablet 1   esomeprazole (NEXIUM) 40 MG capsule Take 1 capsule (40 mg total) by mouth daily before breakfast. 90 capsule 0   furosemide (LASIX) 40 MG tablet TAKE 1 TABLET BY MOUTH EVERY DAY 90 tablet 0   HYDROcodone-acetaminophen (NORCO/VICODIN) 5-325 MG tablet Take 1 tablet by mouth every 8 (eight) hours as needed.      LINZESS 145 MCG CAPS capsule TAKE 1 CAPSULE BY MOUTH EVERY DAY BEFORE BREAKFAST 30 capsule 1   Magnesium 250 MG TABS Take by mouth.     metFORMIN (GLUCOPHAGE) 500 MG tablet TAKE 1 TABLET BY MOUTH 2 TIMES DAILY WITH A MEAL. 180 tablet 1   Potassium Chloride ER 20 MEQ TBCR Take 1 tablet by mouth daily in the afternoon.     traZODone (DESYREL) 50 MG tablet TAKE 1 TABLET BY MOUTH AT BEDTIME FOR SLEEP 90 tablet 1   fluticasone (FLONASE) 50 MCG/ACT nasal spray Place 2 sprays into the nose daily as needed. Taking as needed     No facility-administered medications prior to visit.    PAST MEDICAL HISTORY: Past Medical History:  Diagnosis Date   Allergy    Anxiety    B12 deficiency    Chiari malformation    Chronic headache    Depression    Endometriosis    Female infertility    GERD (gastroesophageal reflux disease)    controlled with nexium   Insomnia    Iron deficiency anemia    Irritable bowel syndrome    constipation   Lower extremity edema    Obesity    PCOS (polycystic ovarian syndrome)    Prediabetes    Pseudomeningocele, acquired    Sleep apnea    compliant with Bi-PAP  Swallowing difficulty    had GI work-up; was told that is was a brain-stem issue    PAST SURGICAL HISTORY: Past Surgical History:  Procedure Laterality Date   ABDOMINAL HYSTERECTOMY  2001   due to endometriosis   BRAIN SURGERY     Chiari malformation   CHOLECYSTECTOMY     COLONOSCOPY     ERCP  2000   ESOPHAGOGASTRODUODENOSCOPY     LAPAROSCOPIC GASTRIC BANDING  2006   SPINAL FUSION      FAMILY HISTORY: Family History  Problem Relation Age of Onset   Colon polyps Mother    Diabetes Mother    Brain cancer Mother    Depression Mother    Colon polyps Father    Non-Hodgkin's lymphoma Father    Bladder Cancer Father    Sleep apnea Father    Colon cancer Neg Hx    Rectal cancer Neg Hx    Stomach cancer Neg Hx     SOCIAL HISTORY: Social History   Socioeconomic History   Marital  status: Married    Spouse name: Shaakira Mohrman   Number of children: 0   Years of education: Not on file   Highest education level: Not on file  Occupational History   Occupation: Scientist, research (life sciences): [employer]  Tobacco Use   Smoking status: Never   Smokeless tobacco: Never  Vaping Use   Vaping Use: Never used  Substance and Sexual Activity   Alcohol use: Yes    Comment: occ   Drug use: No   Sexual activity: Yes    Birth control/protection: Surgical  Other Topics Concern   Not on file  Social History Narrative   Married   Contractor   Fun: likes to travel   No children      Social Determinants of Health   Financial Resource Strain: Not on file  Food Insecurity: Not on file  Transportation Needs: Not on file  Physical Activity: Not on file  Stress: Not on file  Social Connections: Not on file  Intimate Partner Violence: Not on file      PHYSICAL EXAM  Vitals:   03/03/23 0811  BP: 136/76  Pulse: 75  Weight: 299 lb (135.6 kg)  Height: 5\' 7"  (1.702 m)   Body mass index is 46.83 kg/m.  Generalized: Well developed, in no acute distress  Chest: Lungs clear to auscultation bilaterally  Neurological examination  Mentation: Alert oriented to time, place, history taking. Follows all commands speech and language fluent Cranial nerve II-XII: Facial symmetry noted Gait and station: Gait is normal.    DIAGNOSTIC DATA (LABS, IMAGING, TESTING) - I reviewed patient records, labs, notes, testing and imaging myself where available.  Lab Results  Component Value Date   WBC 7.1 05/27/2021   HGB 12.9 05/27/2021   HCT 38.1 05/27/2021   MCV 89.1 05/27/2021   PLT 223.0 05/27/2021      Component Value Date/Time   NA 139 05/27/2021 1435   NA 137 06/03/2020 1515   K 3.8 05/27/2021 1435   CL 103 05/27/2021 1435   CO2 28 05/27/2021 1435   GLUCOSE 162 (H) 05/27/2021 1435   BUN 8 05/27/2021 1435   BUN 13 06/03/2020 1515    CREATININE 0.94 05/27/2021 1435   CREATININE 1.03 07/20/2015 0915   CALCIUM 9.1 05/27/2021 1435   PROT 7.0 05/27/2021 1435   PROT 7.1 06/03/2020 1515   ALBUMIN 4.0 05/27/2021 1435   ALBUMIN 4.4 06/03/2020 1515  AST 39 (H) 05/27/2021 1435   ALT 40 (H) 05/27/2021 1435   ALKPHOS 144 (H) 05/27/2021 1435   BILITOT 0.4 05/27/2021 1435   BILITOT 0.4 06/03/2020 1515   GFRNONAA 62 06/03/2020 1515   GFRNONAA 65 07/20/2015 0915   GFRAA 71 06/03/2020 1515   GFRAA 75 07/20/2015 0915   Lab Results  Component Value Date   CHOL 175 05/27/2021   HDL 45.70 05/27/2021   LDLCALC 123 (H) 06/03/2020   LDLDIRECT 104.0 05/27/2021   TRIG 266.0 (H) 05/27/2021   CHOLHDL 4 05/27/2021   Lab Results  Component Value Date   HGBA1C 7.4 (H) 05/28/2021   Lab Results  Component Value Date   VITAMINB12 272 05/27/2021   Lab Results  Component Value Date   TSH 1.67 05/27/2021      ASSESSMENT AND PLAN 52 y.o. year old female  has a past medical history of Allergy, Anxiety, B12 deficiency, Chiari malformation, Chronic headache, Depression, Endometriosis, Female infertility, GERD (gastroesophageal reflux disease), Insomnia, Iron deficiency anemia, Irritable bowel syndrome, Lower extremity edema, Obesity, PCOS (polycystic ovarian syndrome), Prediabetes, Pseudomeningocele, acquired, Sleep apnea, and Swallowing difficulty. here with:  OSA on CPAP 2.   Depression 3.   Insomnia   - CPAP compliance excellent - Good treatment of AHI  - Encourage patient to use CPAP nightly and > 4 hours each night - Continue Pristiq 50 mg daily - Continue Trazodone 50 mg Daily  - F/U in 1 year or sooner if needed    Ward Givens, MSN, NP-C 03/03/2023, 8:15 AM [facility] [address], [place], [place] [address] [phone]

## 2023-03-14 ENCOUNTER — Other Ambulatory Visit: Payer: Self-pay | Admitting: Physician Assistant

## 2023-03-14 DIAGNOSIS — R7303 Prediabetes: Secondary | ICD-10-CM

## 2023-03-30 ENCOUNTER — Encounter: Payer: Self-pay | Admitting: Physician Assistant

## 2023-04-20 ENCOUNTER — Ambulatory Visit (INDEPENDENT_AMBULATORY_CARE_PROVIDER_SITE_OTHER): Payer: 59 | Admitting: Physician Assistant

## 2023-04-20 ENCOUNTER — Encounter: Payer: Self-pay | Admitting: Physician Assistant

## 2023-04-20 VITALS — BP 130/80 | HR 79 | Temp 97.7°F | Ht 67.0 in | Wt 300.0 lb

## 2023-04-20 DIAGNOSIS — Z0000 Encounter for general adult medical examination without abnormal findings: Secondary | ICD-10-CM

## 2023-04-20 DIAGNOSIS — Z13220 Encounter for screening for lipoid disorders: Secondary | ICD-10-CM

## 2023-04-20 DIAGNOSIS — E1165 Type 2 diabetes mellitus with hyperglycemia: Secondary | ICD-10-CM | POA: Diagnosis not present

## 2023-04-20 DIAGNOSIS — G4733 Obstructive sleep apnea (adult) (pediatric): Secondary | ICD-10-CM | POA: Diagnosis not present

## 2023-04-20 DIAGNOSIS — Z6841 Body Mass Index (BMI) 40.0 and over, adult: Secondary | ICD-10-CM

## 2023-04-20 DIAGNOSIS — Z23 Encounter for immunization: Secondary | ICD-10-CM | POA: Diagnosis not present

## 2023-04-20 DIAGNOSIS — E538 Deficiency of other specified B group vitamins: Secondary | ICD-10-CM

## 2023-04-20 DIAGNOSIS — Q0701 Arnold-Chiari syndrome with spina bifida: Secondary | ICD-10-CM

## 2023-04-20 DIAGNOSIS — Z1211 Encounter for screening for malignant neoplasm of colon: Secondary | ICD-10-CM

## 2023-04-20 LAB — COMPREHENSIVE METABOLIC PANEL
ALT: 28 U/L (ref 0–35)
AST: 27 U/L (ref 0–37)
Albumin: 4.1 g/dL (ref 3.5–5.2)
Alkaline Phosphatase: 129 U/L — ABNORMAL HIGH (ref 39–117)
BUN: 11 mg/dL (ref 6–23)
CO2: 30 mEq/L (ref 19–32)
Calcium: 9.3 mg/dL (ref 8.4–10.5)
Chloride: 100 mEq/L (ref 96–112)
Creatinine, Ser: 0.93 mg/dL (ref 0.40–1.20)
GFR: 69.88 mL/min (ref >60.00)
Glucose, Bld: 136 mg/dL — ABNORMAL HIGH (ref 70–99)
Potassium: 4 mEq/L (ref 3.5–5.1)
Sodium: 142 mEq/L (ref 135–145)
Total Bilirubin: 0.7 mg/dL (ref 0.2–1.2)
Total Protein: 6.8 g/dL (ref 6.0–8.3)

## 2023-04-20 LAB — CBC WITH DIFFERENTIAL/PLATELET
Basophils Absolute: 0 10*3/uL (ref 0.0–0.1)
Basophils Relative: 0.3 % (ref 0.0–3.0)
Eosinophils Absolute: 0.4 10*3/uL (ref 0.0–0.7)
Eosinophils Relative: 4.9 % (ref 0.0–5.0)
HCT: 41 % (ref 36.0–46.0)
Hemoglobin: 13.8 g/dL (ref 12.0–15.0)
Lymphocytes Relative: 37 % (ref 12.0–46.0)
Lymphs Abs: 2.9 10*3/uL (ref 0.7–4.0)
MCHC: 33.6 g/dL (ref 30.0–36.0)
MCV: 89.5 fl (ref 78.0–100.0)
Monocytes Absolute: 0.6 10*3/uL (ref 0.1–1.0)
Monocytes Relative: 7.7 % (ref 3.0–12.0)
Neutro Abs: 4 10*3/uL (ref 1.4–7.7)
Neutrophils Relative %: 50.1 % (ref 43.0–77.0)
Platelets: 278 10*3/uL (ref 150.0–400.0)
RBC: 4.58 Mil/uL (ref 3.87–5.11)
RDW: 14.5 % (ref 11.5–15.5)
WBC: 8 10*3/uL (ref 4.0–10.5)

## 2023-04-20 LAB — SEDIMENTATION RATE: Sed Rate: 34 mm/h — ABNORMAL HIGH (ref 0–30)

## 2023-04-20 LAB — LIPID PANEL
Cholesterol: 194 mg/dL (ref 0–200)
HDL: 62.4 mg/dL (ref >39.00)
LDL Cholesterol: 101 mg/dL — ABNORMAL HIGH (ref 0–99)
NonHDL: 132.07
Total CHOL/HDL Ratio: 3
Triglycerides: 155 mg/dL — ABNORMAL HIGH (ref 0.0–149.0)
VLDL: 31 mg/dL (ref 0.0–40.0)

## 2023-04-20 LAB — VITAMIN B12: Vitamin B-12: 215 pg/mL (ref 211–911)

## 2023-04-20 LAB — C-REACTIVE PROTEIN: CRP: 1.3 mg/dL (ref 0.5–20.0)

## 2023-04-20 MED ORDER — FUROSEMIDE 40 MG PO TABS: 40.0000 mg | ORAL_TABLET | Freq: Every day | ORAL | 1 refills | Status: DC

## 2023-04-20 NOTE — Patient Instructions (Addendum)
It was great to see you!  Mammogram and Colonoscopy due I have put in repeat colonoscopy with Dr Leone Payor in your chart Please schedule your own mammogram (see paper)  I will send blood work to your eye doctor  Please go to the lab for blood work.   Our office will call you with your results unless you have chosen to receive results via MyChart.  If your blood work is normal we will follow-up each year for physicals and as scheduled for chronic medical problems.  If anything is abnormal we will treat accordingly and get you in for a follow-up.  Take care,  Lelon Mast

## 2023-04-20 NOTE — Progress Notes (Signed)
Subjective:    Erin Jimenez is a 52 y.o. female and is here for a comprehensive physical exam.  HPI  Health Maintenance Due  Topic Date Due   Hepatitis C Screening  Never done   Zoster Vaccines- Shingrix (1 of 2) Never done   MAMMOGRAM  01/18/2020   COLONOSCOPY (Pts 45-33yrs Insurance coverage will need to be confirmed)  03/16/2023    Acute Concerns: None  Chronic Issues: Arnold-Chiari malformation She is up-to-date with care with [facility] She also sees Dr. Aura Camps at [facility] She has noticed right eye pain x a few months and eye doctor is thinking that maybe her Chiari has worsened Her eye doctor has requested checking an ESR and CRP  B-12 deficiency Has not had B-12 injections in at least a month Feels like she is tolerating this well  Diabetes 24 month follow-up. Current DM meds: metformin 500 mg daily. Blood sugars at home are: not checked. Patient is compliant with medications. Denies: hypoglycemic or hyperglycemic episodes or symptoms. This patient's diabetes is complicated by HTN.  Lab Results  Component Value Date   HGBA1C 7.4 (H) 05/28/2021    Health Maintenance: Immunizations -- Shingrix today Colonoscopy -- overdue Mammogram -- overdue PAP -- n/a Bone Density -- not due Diet -- trying to work on better diet Exercise -- limited  Sleep habits -- no major concerns Mood -- stable  UTD with dentist? - yes UTD with eye doctor? - yes  Weight history: Wt Readings from Last 10 Encounters:  04/20/23 300 lb (136.1 kg)  03/03/23 299 lb (135.6 kg)  03/02/22 300 lb (136.1 kg)  06/05/21 (!) 313 lb 7.9 oz (142.2 kg)  05/27/21 (!) 313 lb 6.4 oz (142.2 kg)  02/27/21 (!) 314 lb 5 oz (142.6 kg)  12/18/20 (!) 317 lb 6.1 oz (144 kg)  10/10/20 (!) 305 lb (138.3 kg)  09/17/20 (!) 302 lb (137 kg)  08/20/20 300 lb (136.1 kg)   Body mass index is 46.99 kg/m. No LMP recorded. Patient has had a hysterectomy.  Alcohol use:  reports  current alcohol use.  Tobacco use:  Tobacco Use: Low Risk  (04/20/2023)   Patient History    Smoking Tobacco Use: Never    Smokeless Tobacco Use: Never    Passive Exposure: Not on file   Eligible for lung cancer screening? no     04/20/2023    8:22 AM  Depression screen PHQ 2/9  Decreased Interest 0  Down, Depressed, Hopeless 0  PHQ - 2 Score 0     Other providers/specialists: Patient Care Team: Jarold Motto, [place] as PCP - General (Physician Assistant) Luciana Axe, Belia Heman, MD as Consulting Physician (Infectious Diseases) Meisinger, Tawanna Cooler, MD as Consulting Physician (Obstetrics and Gynecology) Shirlean Kelly, MD (Neurosurgery) Dohmeier, Porfirio Mylar, MD as Consulting Physician (Neurology)    PMHx, SurgHx, SocialHx, Medications, and Allergies were reviewed in the Visit Navigator and updated as appropriate.   Past Medical History:  Diagnosis Date   Allergy    Anxiety    B12 deficiency    Chiari malformation    Chronic headache    Depression    Endometriosis    Female infertility    GERD (gastroesophageal reflux disease)    controlled with nexium   Insomnia    Iron deficiency anemia    Irritable bowel syndrome    constipation   Lower extremity edema    Obesity    PCOS (polycystic ovarian syndrome)    Prediabetes  Pseudomeningocele, acquired    Sleep apnea    compliant with Bi-PAP   Swallowing difficulty    had GI work-up; was told that is was a brain-stem issue     Past Surgical History:  Procedure Laterality Date   ABDOMINAL HYSTERECTOMY  2001   due to endometriosis   BRAIN SURGERY     Chiari malformation   CHOLECYSTECTOMY     COLONOSCOPY     ERCP  2000   ESOPHAGOGASTRODUODENOSCOPY     LAPAROSCOPIC GASTRIC BANDING  2006   SPINAL FUSION       Family History  Problem Relation Age of Onset   Colon polyps Mother    Diabetes Mother    Brain cancer Mother    Depression Mother    Colon polyps Father    Non-Hodgkin's lymphoma Father    Bladder Cancer  Father    Sleep apnea Father    Colon cancer Neg Hx    Rectal cancer Neg Hx    Stomach cancer Neg Hx     Social History   Tobacco Use   Smoking status: Never   Smokeless tobacco: Never  Vaping Use   Vaping Use: Never used  Substance Use Topics   Alcohol use: Yes    Comment: occ   Drug use: No    Review of Systems:   ROS  Objective:   BP 130/80 (BP Location: Left Arm, Patient Position: Sitting, Cuff Size: Large)   Pulse 79   Temp 97.7 F (36.5 C) (Temporal)   Ht 5\' 7"  (1.702 m)   Wt 300 lb (136.1 kg)   SpO2 99%   BMI 46.99 kg/m  Body mass index is 46.99 kg/m.   General Appearance:    Alert, cooperative, no distress, appears stated age  Head:    Normocephalic, without obvious abnormality, atraumatic  Eyes:    PERRL, conjunctiva/corneas clear, EOM's intact, fundi    benign, both eyes  Ears:    Normal TM's and external ear canals, both ears  Nose:   Nares normal, septum midline, mucosa normal, no drainage    or sinus tenderness  Throat:   Lips, mucosa, and tongue normal; teeth and gums normal  Neck:   Supple, symmetrical, trachea midline, no adenopathy;    thyroid:  no enlargement/tenderness/nodules; no carotid   bruit or JVD  Back:     Symmetric, no curvature, ROM normal, no CVA tenderness  Lungs:     Clear to auscultation bilaterally, respirations unlabored  Chest Wall:    No tenderness or deformity   Heart:    Regular rate and rhythm, S1 and S2 normal, no murmur, rub or gallop  Breast Exam:    Deferred  Abdomen:     Soft, non-tender, bowel sounds active all four quadrants,    no masses, no organomegaly  Genitalia:    Deferred  Extremities:   Extremities normal, atraumatic, no cyanosis or edema  Pulses:   2+ and symmetric all extremities  Skin:   Skin color, texture, turgor normal, no rashes or lesions  Lymph nodes:   Cervical, supraclavicular, and axillary nodes normal  Neurologic:   CNII-XII intact, normal strength, sensation and reflexes    throughout     Assessment/Plan:   Routine physical examination Today patient counseled on age appropriate routine health concerns for screening and prevention, each reviewed and up to date or declined. Immunizations reviewed and up to date or declined. Labs ordered and reviewed. Risk factors for depression reviewed and negative. Hearing function and  visual acuity are intact. ADLs screened and addressed as needed. Functional ability and level of safety reviewed and appropriate. Education, counseling and referrals performed based on assessed risks today. Patient provided with a copy of personalized plan for preventive services.  Type 2 diabetes mellitus with hyperglycemia, without long-term current use of insulin (HCC) Update A1c and adjust metformin accordingly Consider GLP-1 if patient interested Follow-up based on results  OSA on CPAP Well controlled per patient  Special screening for malignant neoplasms, colon Referral placed  Arnold-Chiari malformation, type II (HCC) Reviewed notes from neurosurgery Will obtain blood work per optho recommendations and send results back to provider  Obesity Continue healthy lifestyle efforts Consider GLP-1  B12 deficiency Update B12 and provide recommendations accordingly  Jarold Motto, PA-C [place] [place] [facility]

## 2023-04-21 ENCOUNTER — Other Ambulatory Visit: Payer: Self-pay | Admitting: Physician Assistant

## 2023-04-21 DIAGNOSIS — R7989 Other specified abnormal findings of blood chemistry: Secondary | ICD-10-CM

## 2023-04-22 NOTE — Telephone Encounter (Signed)
Please see pt response and advise if you would like anything ordered for patient

## 2023-04-23 ENCOUNTER — Other Ambulatory Visit: Payer: Self-pay

## 2023-04-23 DIAGNOSIS — R5383 Other fatigue: Secondary | ICD-10-CM

## 2023-04-28 ENCOUNTER — Other Ambulatory Visit: Payer: Self-pay | Admitting: *Deleted

## 2023-04-28 DIAGNOSIS — Z1211 Encounter for screening for malignant neoplasm of colon: Secondary | ICD-10-CM

## 2023-05-07 ENCOUNTER — Encounter: Payer: Self-pay | Admitting: Internal Medicine

## 2023-05-20 ENCOUNTER — Other Ambulatory Visit: Payer: Self-pay | Admitting: Physician Assistant

## 2023-05-20 DIAGNOSIS — Z1231 Encounter for screening mammogram for malignant neoplasm of breast: Secondary | ICD-10-CM

## 2023-05-25 ENCOUNTER — Other Ambulatory Visit (INDEPENDENT_AMBULATORY_CARE_PROVIDER_SITE_OTHER): Payer: 59

## 2023-05-25 DIAGNOSIS — R7989 Other specified abnormal findings of blood chemistry: Secondary | ICD-10-CM | POA: Diagnosis not present

## 2023-05-25 DIAGNOSIS — E1165 Type 2 diabetes mellitus with hyperglycemia: Secondary | ICD-10-CM | POA: Diagnosis not present

## 2023-05-25 DIAGNOSIS — R5383 Other fatigue: Secondary | ICD-10-CM

## 2023-05-25 LAB — HEMOGLOBIN A1C: Hgb A1c MFr Bld: 6.9 % — ABNORMAL HIGH (ref 4.6–6.5)

## 2023-05-25 LAB — HEPATIC FUNCTION PANEL
ALT: 20 U/L (ref 0–35)
AST: 29 U/L (ref 0–37)
Albumin: 3.8 g/dL (ref 3.5–5.2)
Alkaline Phosphatase: 122 U/L — ABNORMAL HIGH (ref 39–117)
Bilirubin, Direct: 0.1 mg/dL (ref 0.0–0.3)
Total Bilirubin: 0.4 mg/dL (ref 0.2–1.2)
Total Protein: 6.2 g/dL (ref 6.0–8.3)

## 2023-05-25 LAB — VITAMIN D 25 HYDROXY (VIT D DEFICIENCY, FRACTURES): VITD: 29.1 ng/mL — ABNORMAL LOW (ref 30.00–100.00)

## 2023-05-25 LAB — GAMMA GT: GGT: 72 U/L — ABNORMAL HIGH (ref 7–51)

## 2023-05-26 ENCOUNTER — Other Ambulatory Visit: Payer: Self-pay | Admitting: Physician Assistant

## 2023-05-26 ENCOUNTER — Encounter: Payer: Self-pay | Admitting: Physician Assistant

## 2023-05-26 DIAGNOSIS — R7989 Other specified abnormal findings of blood chemistry: Secondary | ICD-10-CM

## 2023-05-26 LAB — IRON,TIBC AND FERRITIN PANEL
%SAT: 19 % (calc) (ref 16–45)
Ferritin: 33 ng/mL (ref 16–232)
Iron: 56 ug/dL (ref 45–160)
TIBC: 300 mcg/dL (calc) (ref 250–450)

## 2023-05-26 LAB — PTH, INTACT AND CALCIUM
Calcium: 9.1 mg/dL (ref 8.6–10.4)
PTH: 93 pg/mL — ABNORMAL HIGH (ref 16–77)

## 2023-05-31 LAB — COLOGUARD: COLOGUARD: NEGATIVE

## 2023-06-03 ENCOUNTER — Encounter: Payer: Self-pay | Admitting: Internal Medicine

## 2023-06-03 ENCOUNTER — Other Ambulatory Visit: Payer: Self-pay | Admitting: Internal Medicine

## 2023-06-06 ENCOUNTER — Other Ambulatory Visit: Payer: Self-pay | Admitting: Neurology

## 2023-06-06 ENCOUNTER — Other Ambulatory Visit: Payer: Self-pay | Admitting: Physician Assistant

## 2023-06-06 DIAGNOSIS — R7303 Prediabetes: Secondary | ICD-10-CM

## 2023-06-06 DIAGNOSIS — Z8349 Family history of other endocrine, nutritional and metabolic diseases: Secondary | ICD-10-CM

## 2023-06-07 ENCOUNTER — Encounter: Payer: Self-pay | Admitting: Internal Medicine

## 2023-06-07 DIAGNOSIS — R7989 Other specified abnormal findings of blood chemistry: Secondary | ICD-10-CM

## 2023-06-07 MED ORDER — LINACLOTIDE 145 MCG PO CAPS: 145.0000 ug | ORAL_CAPSULE | Freq: Every day | ORAL | 0 refills | Status: DC

## 2023-06-07 MED ORDER — DICYCLOMINE HCL 10 MG PO CAPS: 10.0000 mg | ORAL_CAPSULE | Freq: Three times a day (TID) | ORAL | 0 refills | Status: AC | PRN

## 2023-06-07 NOTE — Telephone Encounter (Signed)
June 03, 2023 Erin Jimenez  to Iva Boop, MD     06/03/23  4:45 PM The 30 day will be fine. Thank you ! Iva Boop, MD  to Erin Jimenez      06/03/23  4:39 PM It says Linzess is $100 for 30 or $250 for 90   Preference?   I can rx dicyclomine (Bentyl) for cramps  Last read by Erin Jimenez at  4:44 PM on 06/03/2023. Erin Jimenez  to Iva Boop, MD      06/03/23  1:06 PM I know it's been a while since I've had that prescription.  Thankfully I Don't have this problem often. Yes, please set up whatever you think I need. Thank you Iva Boop, MD  to Erin Jimenez      06/03/23 12:58 PM I can refill Linzess   I cannot find a medication prescribed for cramping based upon my chart review.   I reviewed your recent evaluation w/ Jarold Motto, PA   I recommend you do an ultrasound of liver - we can set that up for you    Iva Boop, MD, [facility]    Last read by Erin Jimenez at  4:44 PM on 06/03/2023.       06/03/23 11:05 AM [place], Patti E, CMA routed this conversation to Iva Boop, MD Erin Jimenez  to [facility] (supporting Iva Boop, MD)      06/03/23 10:25 AM Good morning.  Dr. Leone Payor previously prescribed a medication for cramping alon with Linzess. I know I don't have an appointment until October, but can I please get these refilled ? Thank you

## 2023-06-08 ENCOUNTER — Other Ambulatory Visit: Payer: Self-pay | Admitting: Neurology

## 2023-06-11 ENCOUNTER — Ambulatory Visit (HOSPITAL_COMMUNITY)
Admission: RE | Admit: 2023-06-11 | Discharge: 2023-06-11 | Disposition: A | Discharge status: Home / Self Care | Payer: 59 | Source: Ambulatory Visit | Attending: Internal Medicine | Admitting: Internal Medicine

## 2023-06-11 DIAGNOSIS — R7989 Other specified abnormal findings of blood chemistry: Secondary | ICD-10-CM | POA: Diagnosis present

## 2023-06-15 ENCOUNTER — Other Ambulatory Visit: Payer: Self-pay | Admitting: Internal Medicine

## 2023-06-15 DIAGNOSIS — R748 Abnormal levels of other serum enzymes: Secondary | ICD-10-CM

## 2023-06-15 DIAGNOSIS — K760 Fatty (change of) liver, not elsewhere classified: Secondary | ICD-10-CM

## 2023-06-22 ENCOUNTER — Ambulatory Visit
Admission: RE | Admit: 2023-06-22 | Discharge: 2023-06-22 | Disposition: A | Discharge status: Home / Self Care | Payer: 59 | Source: Ambulatory Visit | Attending: Physician Assistant | Admitting: Physician Assistant

## 2023-06-22 DIAGNOSIS — Z1231 Encounter for screening mammogram for malignant neoplasm of breast: Secondary | ICD-10-CM

## 2023-07-29 ENCOUNTER — Other Ambulatory Visit: Payer: Self-pay | Admitting: Neurosurgery

## 2023-07-29 DIAGNOSIS — M4722 Other spondylosis with radiculopathy, cervical region: Secondary | ICD-10-CM

## 2023-07-29 DIAGNOSIS — G500 Trigeminal neuralgia: Secondary | ICD-10-CM

## 2023-08-09 ENCOUNTER — Encounter: Payer: Self-pay | Admitting: Adult Health

## 2023-08-09 ENCOUNTER — Other Ambulatory Visit: Payer: Self-pay | Admitting: Neurology

## 2023-08-09 MED ORDER — TRAZODONE HCL 50 MG PO TABS: 75.0000 mg | ORAL_TABLET | Freq: Every day | ORAL | 1 refills | Status: DC

## 2023-09-05 ENCOUNTER — Other Ambulatory Visit: Payer: Self-pay | Admitting: Physician Assistant

## 2023-09-05 ENCOUNTER — Other Ambulatory Visit: Payer: Self-pay | Admitting: Neurology

## 2023-09-11 ENCOUNTER — Ambulatory Visit
Admission: RE | Admit: 2023-09-11 | Discharge: 2023-09-11 | Disposition: A | Discharge status: Home / Self Care | Payer: 59 | Source: Ambulatory Visit | Attending: Neurosurgery | Admitting: Neurosurgery

## 2023-09-11 DIAGNOSIS — M4722 Other spondylosis with radiculopathy, cervical region: Secondary | ICD-10-CM

## 2023-09-11 DIAGNOSIS — G500 Trigeminal neuralgia: Secondary | ICD-10-CM

## 2023-09-11 MED ORDER — GADOPICLENOL 0.5 MMOL/ML IV SOLN
10.0000 mL | Freq: Once | INTRAVENOUS | Status: AC | PRN
  Administered 2023-09-11: 10 mL via INTRAVENOUS

## 2023-09-14 ENCOUNTER — Other Ambulatory Visit (INDEPENDENT_AMBULATORY_CARE_PROVIDER_SITE_OTHER): Payer: 59

## 2023-09-14 ENCOUNTER — Encounter: Payer: Self-pay | Admitting: Internal Medicine

## 2023-09-14 ENCOUNTER — Ambulatory Visit (INDEPENDENT_AMBULATORY_CARE_PROVIDER_SITE_OTHER): Payer: 59 | Admitting: Internal Medicine

## 2023-09-14 VITALS — BP 126/72 | HR 77 | Ht 67.0 in | Wt 302.0 lb

## 2023-09-14 DIAGNOSIS — R748 Abnormal levels of other serum enzymes: Secondary | ICD-10-CM | POA: Diagnosis not present

## 2023-09-14 DIAGNOSIS — K760 Fatty (change of) liver, not elsewhere classified: Secondary | ICD-10-CM | POA: Diagnosis not present

## 2023-09-14 DIAGNOSIS — K581 Irritable bowel syndrome with constipation: Secondary | ICD-10-CM

## 2023-09-14 DIAGNOSIS — E88819 Insulin resistance, unspecified: Secondary | ICD-10-CM | POA: Diagnosis not present

## 2023-09-14 LAB — HEPATIC FUNCTION PANEL
ALT: 25 U/L (ref 0–35)
AST: 30 U/L (ref 0–37)
Albumin: 3.9 g/dL (ref 3.5–5.2)
Alkaline Phosphatase: 126 U/L — ABNORMAL HIGH (ref 39–117)
Bilirubin, Direct: 0.1 mg/dL (ref 0.0–0.3)
Total Bilirubin: 0.7 mg/dL (ref 0.2–1.2)
Total Protein: 6.9 g/dL (ref 6.0–8.3)

## 2023-09-14 NOTE — Patient Instructions (Signed)
VISIT SUMMARY:  During your recent visit, we discussed your elevated alkaline phosphatase levels, which suggest a condition called Non-alcoholic Fatty Liver Disease (NAFLD). This condition is common in people with insulin resistance and polycystic ovary syndrome, like yourself. We also discussed your ongoing management of polycystic ovary syndrome and irritable bowel syndrome.  YOUR PLAN:  -NON-ALCOHOLIC FATTY LIVER DISEASE (NAFLD): NAFLD is a condition where excess fat is stored in your liver. We will order additional labs to rule out other causes of liver disease. Depending on the results, we may consider a specialized ultrasound to assess for liver fibrosis. It's important to minimize alcohol intake, lose weight, and adhere to a low carbohydrate, high protein diet. We also discussed eliminating sugary beverages and starchy foods from your diet. Check to see if  [facility] is covered by your insurance for dietary management.  Once I review today's labs will make additional recommendations. We can coordinate additional labs with Rinaldo Cloud, PA-C. Please See Dr. Jolinda Croak handout about reducing carbohydrates in diet.  I appreciate the opportunity to care for you. Iva Boop, MD, [facility]

## 2023-09-14 NOTE — Progress Notes (Signed)
Erin Jimenez 52 y.o. [birth year] [id number]  Assessment & Plan:   Encounter Diagnoses  Name Primary?   Abnormal alkaline phosphatase test Yes   NAFLD (nonalcoholic fatty liver disease)    Insulin resistance    Severe obesity (BMI >= 40) (HCC)    Irritable bowel syndrome with constipation        Non-alcoholic Fatty Liver Disease (NAFLD aka MAFLD) Elevated alkaline phosphatase and ultrasound suggestive of fatty liver in the context of insulin resistance and polycystic ovary syndrome. No symptoms of advanced liver disease. Discussed the risk of progression to cirrhosis and the importance of minimizing alcohol intake.  Lab Results  Component Value Date   ALT 25 09/14/2023   AST 30 09/14/2023   ALKPHOS 126 (H) 09/14/2023   BILITOT 0.7 09/14/2023   Based upon these labs and the most recent CBC fib 4 calculation is 1.17 points.  Advanced fibrosis thought to excluded no need for FibroScan in her at this time.  -We should consider ordering additional labs to rule out other causes of liver disease (autoimmune, infectious hepatitis at a later date - when she sees PCP.  I did not think of that beforehand, she was stuck 3 times today and did not wish to have additional phlebotomy.  At this point we know she does not have celiac disease, and her ferritin has not been significantly elevated ruling out hemochromatosis I think.  If we want to be more complete and I think this is ideal but optional would check hep C plus B studies and consider mitochondrial antibodies and ANA.  I do not think she needs alpha-1 antitrypsin or a ceruloplasmin in her situation.  I think the overwhelming likelihood is that she has metabolic associated fatty liver disease.  It is possible the alk phos could be related to the elevated PTH as well though the GGT was high.  I think she can follow-up with me as needed at this point regarding the liver.   -Consider specialized ultrasound (FibroScan) to assess for liver  fibrosis depending on results of additional labs and Fib-4 calculation. -Encourage weight loss and adherence to a low carbohydrate, high protein diet. Specifically, advised to eliminate sugary beverages and starchy foods, and to be cautious of "low fat" labels. -Consider referral to [facility] for dietary management if covered by insurance.   Irritable Bowel Syndrome (IBS) As needed use of Linzess and Bentyl. -Continue current management.      Colon cancer screening up-to-date with Cologuard defer to PCP    Subjective:  Gastroenterology Summary  IBS-C  Past history of Amitiza, currently using as needed Linzess and rare dicyclomine.  There is a history of suspected ischemic colitis as well.  Colonoscopy 2014 after that normal  Dysphagia unclear etiology thought possibly related to laparoscopic gastric band this remains deflated) 2014 EGD normal empiric 52 French Maloney dilation was not helpful 2021 EGD question dysmotility observed she did have suboccipital craniotomy for Chiari decompression and cervical spine fusion in 2012 and dysphagia may be related to that  GERD effectively treated with PPI  Choledocholithiasis remote ERCP, she is status postcholecystectomy  Abnormal celiac antibody panel with deamidated IgG gliadin antibodies positive but all other antibodies negative and duodenal biopsies normal August 2021 had iron deficiency anemia at that time.  Colonoscopy recommended but not completed.  Cologuard negative 05/24/2023 (through PCP)  MAFLD  Chief Complaint: abnormal alkaline phosphatase, fatty liver  HPI  Discussed the use of AI scribe software for clinical note transcription with  the patient, who gave verbal consent to proceed.   Erin Jimenez, a patient with a history of insulin resistance and polycystic ovary syndrome, presents for a follow-up regarding abnormal liver chemistry, specifically elevated alkaline phosphatase levels. this was detected by PCP and she also has  a history of mild transaminase elevation in the past. An ultrasound of the liver revealed changes c/w steatosis Florabelle reports no known associate problems or symptoms. The abnormal liver chemistry was discovered during routine lab work.  Erin Jimenez denies any significant alcohol consumption,. She also reports having dicyclomine (Bentyl) on hand as needed and Linzess is used to treat constipation on an as-needed basis, indicating no significant gastrointestinal issues.  Erin Jimenez acknowledges her struggle with weight management, which is complicated by her inherent insulin resistance associated with polycystic ovary syndrome. She has previously sought help from a healthy weight and wellness program but had to discontinue due to time and cost constraints. She admits to consuming one regular soda daily and agrees to eliminate this from her diet.  Erin Jimenez denies any symptoms of advanced liver disease such as jaundice or dark urine. However, she is aware of her increased risk for diabetes and heart disease due to her liver condition and metabolic dysregulation.     EXAM: ULTRASOUND ABDOMEN LIMITED RIGHT UPPER QUADRANT   COMPARISON:  CT abdomen pelvis 09/16/2012   FINDINGS: Gallbladder:   Surgically absent   Common bile duct:   Diameter: 3.3 mm   Liver:   Increased echogenicity. No focal lesion. Portal vein is patent on color Doppler imaging with normal direction of blood flow towards the liver.   Other: None.   IMPRESSION: 1. Status post cholecystectomy. 2. Increased hepatic parenchymal echogenicity suggestive of steatosis.     Electronically Signed   By: Annia Belt M.D.   On: 06/14/2023 09:56 LFT history: June 2022 alk phos 144 AST 39 ALT 40 normal bilirubin 04/20/2023 alk phos 129 AST 27 ALT 28 normal bilirubin May 25, 2023 alk phos 122 AST 29 ALT 20 albumin 3.8 normal bilirubin GGT May 25, 1971 elevated  Lab Results  Component Value Date   WBC 8.0 04/20/2023   HGB 13.8 04/20/2023    HCT 41.0 04/20/2023   MCV 89.5 04/20/2023   PLT 278.0 04/20/2023     Iron sat 19% ferritin 33 May 25, 2023 Hemoglobin A1c 6.9% May 25, 2023 PTH slightly high at 79 with normal calcium May 25, 2023 Vitamin D 29 plan was to supplement vitamin D and recheck PTH Allergies  Allergen Reactions   Ciprofloxacin Nausea Only   Doxycycline Nausea Only   Other Nausea Only   Oxycodone Nausea Only   Sulfa Antibiotics Nausea Only   Current Meds  Medication Sig   cholecalciferol (VITAMIN D) 1000 UNITS tablet Take 4,000 Units by mouth daily.    cyanocobalamin (VITAMIN B12) 1000 MCG/ML injection INJECT 1 ML (1,000 MCG) INTRAMUSCULARLY EVERY 30 DAYS   cyclobenzaprine (FLEXERIL) 10 MG tablet Take 10 mg by mouth at bedtime.   desvenlafaxine (PRISTIQ) 50 MG 24 hr tablet TAKE 1 TABLET BY MOUTH EVERY DAY   dicyclomine (BENTYL) 10 MG capsule Take 1 capsule (10 mg total) by mouth 3 (three) times daily as needed for spasms.   esomeprazole (NEXIUM) 40 MG capsule Take 1 capsule (40 mg total) by mouth daily before breakfast.   furosemide (LASIX) 40 MG tablet TAKE 1 TABLET BY MOUTH EVERY DAY   HYDROcodone-acetaminophen (NORCO/VICODIN) 5-325 MG tablet Take 1 tablet by mouth every 8 (eight) hours as needed.  linaclotide (LINZESS) 145 MCG CAPS capsule Take 1 capsule (145 mcg total) by mouth daily before breakfast.   Magnesium 250 MG TABS Take by mouth.   metFORMIN (GLUCOPHAGE) 500 MG tablet TAKE 1 TABLET BY MOUTH 2 TIMES DAILY WITH A MEAL.   Potassium Chloride ER 20 MEQ TBCR Take 1 tablet by mouth daily in the afternoon.   traZODone (DESYREL) 50 MG tablet Take 1.5 tablets (75 mg total) by mouth at bedtime.   Past Medical History:  Diagnosis Date   Allergy    Anxiety    B12 deficiency    Chiari malformation    Chronic headache    Depression    Endometriosis    Female infertility    GERD (gastroesophageal reflux disease)    controlled with nexium   Insomnia    Insulin resistance    Iron deficiency  anemia    Irritable bowel syndrome    constipation   Lower extremity edema    Obesity    PCOS (polycystic ovarian syndrome)    Pseudomeningocele, acquired    Sleep apnea    compliant with Bi-PAP   Swallowing difficulty    had GI work-up; was told that is was a brain-stem issue   Type 2 diabetes mellitus (HCC)    Past Surgical History:  Procedure Laterality Date   ABDOMINAL HYSTERECTOMY  2001   due to endometriosis   BRAIN SURGERY     Chiari malformation   CHOLECYSTECTOMY     COLONOSCOPY     ERCP  2000   ESOPHAGOGASTRODUODENOSCOPY     LAPAROSCOPIC GASTRIC BANDING  2006   SPINAL FUSION     Social History   Social History Narrative   Married   Nature conservation officer   Fun: likes to travel   No children      family history includes Bladder Cancer in her father; Brain cancer in her mother; Colon polyps in her father and mother; Depression in her mother; Diabetes in her mother; Non-Hodgkin's lymphoma in her father; Sleep apnea in her father.   Review of Systems As per HPI  Objective:   Physical Exam BP 126/72   Pulse 77   Ht 5\' 7"  (1.702 m)   Wt (!) 302 lb (137 kg)   SpO2 98%   BMI 47.30 kg/m    MEASUREMENTS: Patient is obese. CHEST: Lungs sound clear. CARDIOVASCULAR: Heart sounds are normal. ABDOMEN: Bowel sounds are normal, abdomen is soft and nontender. EXTREMITIES: No swelling or edema at the ankles. SKIN: No signs of chronic liver disease.

## 2023-10-05 ENCOUNTER — Ambulatory Visit: Payer: 59 | Admitting: Physical Therapy

## 2023-10-26 ENCOUNTER — Ambulatory Visit: Payer: 59 | Attending: Neurosurgery | Admitting: Physical Therapy

## 2023-10-26 ENCOUNTER — Other Ambulatory Visit: Payer: Self-pay

## 2023-10-26 DIAGNOSIS — M542 Cervicalgia: Secondary | ICD-10-CM | POA: Insufficient documentation

## 2023-10-26 DIAGNOSIS — M6281 Muscle weakness (generalized): Secondary | ICD-10-CM | POA: Diagnosis present

## 2023-10-26 NOTE — Therapy (Unsigned)
OUTPATIENT PHYSICAL THERAPY SHOULDER EVALUATION   Patient Name: Erin Jimenez MRN: [id number] DOB:1971/10/26, 52 y.o., female Today's Date: 10/27/2023   PT End of Session - 10/27/23 0902     Visit Number 1    Number of Visits --   1-2x/week   Date for PT Re-Evaluation 12/22/23    Authorization Type [employer] - FOTO    PT Start Time 0545    PT Stop Time 0620    PT Time Calculation (min) 35 min             Past Medical History:  Diagnosis Date   Allergy    Anxiety    B12 deficiency    Chiari malformation    Chronic headache    Depression    Endometriosis    Female infertility    GERD (gastroesophageal reflux disease)    controlled with nexium   Insomnia    Insulin resistance    Iron deficiency anemia    Irritable bowel syndrome    constipation   Lower extremity edema    Obesity    PCOS (polycystic ovarian syndrome)    Pseudomeningocele, acquired    Sleep apnea    compliant with Bi-PAP   Swallowing difficulty    had GI work-up; was told that is was a brain-stem issue   Type 2 diabetes mellitus (HCC)    Past Surgical History:  Procedure Laterality Date   ABDOMINAL HYSTERECTOMY  2001   due to endometriosis   BRAIN SURGERY     Chiari malformation   CHOLECYSTECTOMY     COLONOSCOPY     ERCP  2000   ESOPHAGOGASTRODUODENOSCOPY     LAPAROSCOPIC GASTRIC BANDING  2006   SPINAL FUSION     Patient Active Problem List   Diagnosis Date Noted   Treatment-emergent central sleep apnea 09/10/2020   Encounter for counseling on adaptive servo-ventilation (ASV) use 09/10/2020   Iron deficiency anemia 05/09/2020   Perennial allergic rhinitis 05/17/2019   Menopausal symptom 02/14/2019   Occipital neuralgia of right side 09/29/2017   Central sleep apnea syndrome 04/26/2015   Hypoventilation associated with obesity syndrome (HCC) 04/26/2015   OSA on CPAP 02/05/2015   Obesity hypoventilation syndrome (HCC) 12/25/2014   Depression 12/25/2014   Hypersomnia with sleep apnea  12/25/2014   Chronic tension-type headache, intractable 12/25/2014   Arnold-Chiari malformation, type II (HCC) 12/25/2014   Dysphagia 01/10/2013   IBS (irritable bowel syndrome) 01/10/2013   GERD (gastroesophageal reflux disease) 02/10/2012   Allergic rhinitis due to pollen 02/10/2012   Insomnia 02/10/2012   B12 deficiency 02/10/2012   Pseudomeningocele, acquired 06/11/2011   Chronic constipation 02/10/2008   Endometriosis 02/10/2008    PCP: Jarold Motto, PA  REFERRING PROVIDER: Donalee Citrin, MD  THERAPY DIAG:  Cervicalgia - Plan: PT plan of care cert/re-cert  Muscle weakness - Plan: PT plan of care cert/re-cert  REFERRING DIAG: Radiculopathy, cervical region [M54.12], Cervicalgia [M54.2]   Rationale for Evaluation and Treatment:  Rehabilitation  SUBJECTIVE:  PERTINENT PAST HISTORY:  Chiari decompression with cervical fusion      PRECAUTIONS: Chiari decompression, suboccipital craniectomy, upper cervical laminectomy, duraplasty and a C3 to C5 posterior cervical arthrodesis with lateral mass screws, rods and bone graft (2012);  PER CALL TO MD OFFICE: DO NOT NEEDLE SUB OCCIPITALS OR CX SPINE  WEIGHT BEARING RESTRICTIONS No  FALLS:  Has patient fallen in last 6 months? No, Number of falls: 0  MOI/History of condition:  Onset date: ~ 6 months  SUBJECTIVE STATEMENT  Erin Pikes  [name] is a 52 y.o. female who presents to clinic with chief complaint of neck pain.  The pain starts at the mid cervical spine and can radiate to bil UT and mid thoracic spine.  No known injury or trauma.  She does receive injections which are helpful.  She rarely has some n/t in her hands.   Red flags:  denies   Pain:  Are you having pain? Yes Pain location: neck into UT NPRS scale:  3/10 to 9/10 Aggravating factors: weather, stress, R rotation Relieving factors: take medication Pain description: sharp, dull, and aching Stage: Chronic 24 hour pattern: best first thing in the morning    Occupation: sell commercial trucks, has to climb in or out trucks  Assistive Device: NA  Hand Dominance: R  Patient Goals/Specific Activities: reduce pain   OBJECTIVE:   DIAGNOSTIC FINDINGS:  IMPRESSION: 1. No change since the most recent MRI. Previous posterior fusion from C3 through C5 with lateral mass screws and posterior rods. Wide laminectomy from the skull base through C4. 2. Chronic prominence of the central canal from C3 to the upper thoracic region. Some of the cord signal is paramedian and there could be some foci of cord gliosis. No evident change since the prior exam. 3. Small pseudomeningocele posterior to the dura at C2 and C3, not enlarged or significant. 4. Mild non-compressive disc bulges at C5-6, C6-7 and C7-T1.  GENERAL OBSERVATION:  Forward head, rounded shoulders     SENSATION:  Light touch: Appears intact   PALPATION: Significant TTP R>L sub occipitals and R>L UT  Cervical ROM  ROM ROM  (Eval)  Flexion 30  Extension 20  Right lateral flexion 28  Left lateral flexion 30  Right rotation 20*  Left rotation 35  Flexion rotation (normal is 30 degrees)   Flexion rotation (normal is 30 degrees)     (Blank rows = not tested, N = WNL, * = concordant pain)  UPPER EXTREMITY MMT:  MMT Right (Eval) Left (Eval)  Shoulder flexion 4+ 4+  Shoulder abduction (C5)    Shoulder ER 4+ 4+  Shoulder IR 4 4  Middle trapezius 4 4  Lower trapezius 3+ 3+  Shoulder extension    Grip strength    Shoulder shrug (C4)    Elbow flexion (C6)    Elbow ext (C7)    Thumb ext (C8)    Finger abd (T1)    Grossly     (Blank rows = not tested, score listed is out of 5 possible points.  N = WNL, D = diminished, C = clear for gross weakness with myotome testing, * = concordant pain with testing)   SPECIAL TESTS:  NT  JOINT MOBILITY TESTING:  Hypomobile - as expected given fusion  PATIENT SURVEYS:  FOTO 51 -> 55    TODAY'S TREATMENT:  Manual  therapy: Skilled palpation to identify trigger points for TDN STM to all listed muscles following TDN  Trigger Point Dry-Needling  Treatment instructions: Expect mild to moderate muscle soreness. S/S of pneumothorax if dry needled over a lung field, and to seek immediate medical attention should they occur. Patient verbalized understanding of these instructions and education.  Patient Consent Given: Yes Education handout provided: No Muscles treated: R UT, R sub occipitals staying well lateral of midline with palpation of firm backdrop Electrical stimulation performed: No Parameters: N/A Treatment response/outcome: twitch, pain relief, no adverse reaction noted    PATIENT EDUCATION:  POC, diagnosis, prognosis, and outcome measures.  Pt educated  via explanation, demonstration.  Pt confirms understanding verbally.    HOME EXERCISE PROGRAM: Provide at V2  Treatment priorities   Eval        TDN/Manual        Gentle strengthening                                  ASSESSMENT:  CLINICAL IMPRESSION: Erin Jimenez is a 52 y.o. female who presents to clinic with signs and sxs consistent with neck pain.  Surgical history significant for Chiari decompression, suboccipital craniectomy, upper cervical laminectomy, duraplasty and a C3 to C5 posterior cervical arthrodesis with lateral mass screws, rods and bone graft in 2012.  Pt with good pain relief with TDN.  Will avoid direct needling of mid to upper cervical spine d/t laminectomy. Addendum 11/13: After call to MD, do not needle sub occipitals.  OBJECTIVE IMPAIRMENTS: Pain, cervical ROM, shoulder and periscapular strength  ACTIVITY LIMITATIONS: turning head while driving, sitting, lifting, reading  PERSONAL FACTORS: See medical history and pertinent history   REHAB POTENTIAL: Good  CLINICAL DECISION MAKING: Stable/uncomplicated  EVALUATION COMPLEXITY: Low   GOALS:   SHORT TERM GOALS: Target date: 11/24/2023   Erin Jimenez will be >75%  HEP compliant to improve carryover between sessions and facilitate independent management of condition  Evaluation: ongoing Goal status: INITIAL   LONG TERM GOALS: Target date: 12/22/2023   Erin Jimenez will improve FOTO score to 55 as a proxy for functional improvement  Evaluation/Baseline: 51 Goal status: INITIAL    2.  Erin Jimenez will self report >/= 50% decrease in pain from evaluation to improve function in daily tasks  Evaluation/Baseline: 9/10 max pain Goal status: INITIAL   3.  Erin Jimenez will report confidence in self management of condition at time of discharge with advanced HEP  Evaluation/Baseline: Goal status: INITIAL   4.  Erin Jimenez will improve the following MMTs to >/= 4+/5 to show improvement in strength:     Evaluation/Baseline:   UPPER EXTREMITY MMT:  MMT Right (Eval) Left (Eval)  Shoulder flexion 4+ 4+  Shoulder abduction (C5)    Shoulder ER 4+ 4+  Shoulder IR 4 4  Middle trapezius 4 4  Lower trapezius 3+ 3+  Shoulder extension    Grip strength    Shoulder shrug (C4)    Elbow flexion (C6)    Elbow ext (C7)    Thumb ext (C8)    Finger abd (T1)    Grossly     (Blank rows = not tested, score listed is out of 5 possible points.  N = WNL, D = diminished, C = clear for gross weakness with myotome testing, * = concordant pain with testing)  Goal status: INITIAL    PLAN: PT FREQUENCY: 1-2x/week  PT DURATION: 8 weeks  PLANNED INTERVENTIONS:  97164- PT Re-evaluation, 97110-Therapeutic exercises, 97530- Therapeutic activity, [id number]- Neuromuscular re-education, 97535- Self Care, 86578- Manual therapy, [id number]- Gait training, [id number]- Aquatic Therapy, [phone]- Electrical stimulation (manual), [id number]- Vasopneumatic device, [id number]- Traction (mechanical), [id number]- Ionotophoresis 4mg /ml Dexamethasone, Taping, Dry Needling, Joint manipulation, and Spinal manipulation.   Alphonzo Severance PT, DPT 10/27/2023, 6:34 PM

## 2023-11-15 NOTE — Therapy (Unsigned)
OUTPATIENT PHYSICAL THERAPY SHOULDER EVALUATION   Patient Name: Erin Jimenez MRN: [id number] DOB:Aug 03, 1971, 52 y.o., female Today's Date: 11/15/2023     Past Medical History:  Diagnosis Date   Allergy    Anxiety    B12 deficiency    Chiari malformation    Chronic headache    Depression    Endometriosis    Female infertility    GERD (gastroesophageal reflux disease)    controlled with nexium   Insomnia    Insulin resistance    Iron deficiency anemia    Irritable bowel syndrome    constipation   Lower extremity edema    Obesity    PCOS (polycystic ovarian syndrome)    Pseudomeningocele, acquired    Sleep apnea    compliant with Bi-PAP   Swallowing difficulty    had GI work-up; was told that is was a brain-stem issue   Type 2 diabetes mellitus (HCC)    Past Surgical History:  Procedure Laterality Date   ABDOMINAL HYSTERECTOMY  2001   due to endometriosis   BRAIN SURGERY     Chiari malformation   CHOLECYSTECTOMY     COLONOSCOPY     ERCP  2000   ESOPHAGOGASTRODUODENOSCOPY     LAPAROSCOPIC GASTRIC BANDING  2006   SPINAL FUSION     Patient Active Problem List   Diagnosis Date Noted   Treatment-emergent central sleep apnea 09/10/2020   Encounter for counseling on adaptive servo-ventilation (ASV) use 09/10/2020   Iron deficiency anemia 05/09/2020   Perennial allergic rhinitis 05/17/2019   Menopausal symptom 02/14/2019   Occipital neuralgia of right side 09/29/2017   Central sleep apnea syndrome 04/26/2015   Hypoventilation associated with obesity syndrome (HCC) 04/26/2015   OSA on CPAP 02/05/2015   Obesity hypoventilation syndrome (HCC) 12/25/2014   Depression 12/25/2014   Hypersomnia with sleep apnea 12/25/2014   Chronic tension-type headache, intractable 12/25/2014   Arnold-Chiari malformation, type II (HCC) 12/25/2014   Dysphagia 01/10/2013   IBS (irritable bowel syndrome) 01/10/2013   GERD (gastroesophageal reflux disease) 02/10/2012   Allergic  rhinitis due to pollen 02/10/2012   Insomnia 02/10/2012   B12 deficiency 02/10/2012   Pseudomeningocele, acquired 06/11/2011   Chronic constipation 02/10/2008   Endometriosis 02/10/2008    PCP: Jarold Motto, PA  REFERRING PROVIDER: Donalee Citrin, MD  THERAPY DIAG:  No diagnosis found.  REFERRING DIAG: Radiculopathy, cervical region [M54.12], Cervicalgia [M54.2]   Rationale for Evaluation and Treatment:  Rehabilitation  SUBJECTIVE:  PERTINENT PAST HISTORY:  Chiari decompression with cervical fusion      PRECAUTIONS: Chiari decompression, suboccipital craniectomy, upper cervical laminectomy, duraplasty and a C3 to C5 posterior cervical arthrodesis with lateral mass screws, rods and bone graft (2012);  PER CALL TO MD OFFICE: DO NOT NEEDLE SUB OCCIPITALS OR CX SPINE  WEIGHT BEARING RESTRICTIONS No  FALLS:  Has patient fallen in last 6 months? No, Number of falls: 0  MOI/History of condition:  Onset date: ~ 6 months  SUBJECTIVE STATEMENT  Erin Jimenez is a 52 y.o. female who presents to clinic with chief complaint of neck pain.  The pain starts at the mid cervical spine and can radiate to bil UT and mid thoracic spine.  No known injury or trauma.  She does receive injections which are helpful.  She rarely has some n/t in her hands.   Red flags:  denies   Pain:  Are you having pain? Yes Pain location: neck into UT NPRS scale:  3/10 to 9/10 Aggravating factors:  weather, stress, R rotation Relieving factors: take medication Pain description: sharp, dull, and aching Stage: Chronic 24 hour pattern: best first thing in the morning   Occupation: sell commercial trucks, has to climb in or out trucks  Assistive Device: NA  Hand Dominance: R  Patient Goals/Specific Activities: reduce pain   OBJECTIVE:   DIAGNOSTIC FINDINGS:  IMPRESSION: 1. No change since the most recent MRI. Previous posterior fusion from C3 through C5 with lateral mass screws and posterior  rods. Wide laminectomy from the skull base through C4. 2. Chronic prominence of the central canal from C3 to the upper thoracic region. Some of the cord signal is paramedian and there could be some foci of cord gliosis. No evident change since the prior exam. 3. Small pseudomeningocele posterior to the dura at C2 and C3, not enlarged or significant. 4. Mild non-compressive disc bulges at C5-6, C6-7 and C7-T1.  GENERAL OBSERVATION:  Forward head, rounded shoulders     SENSATION:  Light touch: Appears intact   PALPATION: Significant TTP R>L sub occipitals and R>L UT  Cervical ROM  ROM ROM  (Eval)  Flexion 30  Extension 20  Right lateral flexion 28  Left lateral flexion 30  Right rotation 20*  Left rotation 35  Flexion rotation (normal is 30 degrees)   Flexion rotation (normal is 30 degrees)     (Blank rows = not tested, N = WNL, * = concordant pain)  UPPER EXTREMITY MMT:  MMT Right (Eval) Left (Eval)  Shoulder flexion 4+ 4+  Shoulder abduction (C5)    Shoulder ER 4+ 4+  Shoulder IR 4 4  Middle trapezius 4 4  Lower trapezius 3+ 3+  Shoulder extension    Grip strength    Shoulder shrug (C4)    Elbow flexion (C6)    Elbow ext (C7)    Thumb ext (C8)    Finger abd (T1)    Grossly     (Blank rows = not tested, score listed is out of 5 possible points.  N = WNL, D = diminished, C = clear for gross weakness with myotome testing, * = concordant pain with testing)   SPECIAL TESTS:  NT  JOINT MOBILITY TESTING:  Hypomobile - as expected given fusion  PATIENT SURVEYS:  FOTO 51 -> 55    TODAY'S TREATMENT:  Manual therapy: Skilled palpation to identify trigger points for TDN STM to all listed muscles following TDN  Trigger Point Dry-Needling  Treatment instructions: Expect mild to moderate muscle soreness. S/S of pneumothorax if dry needled over a lung field, and to seek immediate medical attention should they occur. Patient verbalized understanding of these  instructions and education.  Patient Consent Given: Yes Education handout provided: No Muscles treated: R UT, R sub occipitals staying well lateral of midline with palpation of firm backdrop Electrical stimulation performed: No Parameters: N/A Treatment response/outcome: twitch, pain relief, no adverse reaction noted    PATIENT EDUCATION:  POC, diagnosis, prognosis, and outcome measures.  Pt educated via explanation, demonstration.  Pt confirms understanding verbally.    HOME EXERCISE PROGRAM: Provide at V2  Treatment priorities   Eval        TDN/Manual        Gentle strengthening                                  ASSESSMENT:  CLINICAL IMPRESSION: Erin Jimenez is a 52 y.o. female who presents to clinic  with signs and sxs consistent with neck pain.  Surgical history significant for Chiari decompression, suboccipital craniectomy, upper cervical laminectomy, duraplasty and a C3 to C5 posterior cervical arthrodesis with lateral mass screws, rods and bone graft in 2012.  Pt with good pain relief with TDN.  Will avoid direct needling of mid to upper cervical spine d/t laminectomy. Addendum 11/13: After call to MD, do not needle sub occipitals.  OBJECTIVE IMPAIRMENTS: Pain, cervical ROM, shoulder and periscapular strength  ACTIVITY LIMITATIONS: turning head while driving, sitting, lifting, reading  PERSONAL FACTORS: See medical history and pertinent history   REHAB POTENTIAL: Good  CLINICAL DECISION MAKING: Stable/uncomplicated  EVALUATION COMPLEXITY: Low   GOALS:   SHORT TERM GOALS: Target date: 11/24/2023   Erin Jimenez will be >75% HEP compliant to improve carryover between sessions and facilitate independent management of condition  Evaluation: ongoing Goal status: INITIAL   LONG TERM GOALS: Target date: 12/22/2023   Erin Jimenez will improve FOTO score to 55 as a proxy for functional improvement  Evaluation/Baseline: 51 Goal status: INITIAL    2.  Erin Jimenez will self report >/=  50% decrease in pain from evaluation to improve function in daily tasks  Evaluation/Baseline: 9/10 max pain Goal status: INITIAL   3.  Erin Jimenez will report confidence in self management of condition at time of discharge with advanced HEP  Evaluation/Baseline: Goal status: INITIAL   4.  Erin Jimenez will improve the following MMTs to >/= 4+/5 to show improvement in strength:     Evaluation/Baseline:   UPPER EXTREMITY MMT:  MMT Right (Eval) Left (Eval)  Shoulder flexion 4+ 4+  Shoulder abduction (C5)    Shoulder ER 4+ 4+  Shoulder IR 4 4  Middle trapezius 4 4  Lower trapezius 3+ 3+  Shoulder extension    Grip strength    Shoulder shrug (C4)    Elbow flexion (C6)    Elbow ext (C7)    Thumb ext (C8)    Finger abd (T1)    Grossly     (Blank rows = not tested, score listed is out of 5 possible points.  N = WNL, D = diminished, C = clear for gross weakness with myotome testing, * = concordant pain with testing)  Goal status: INITIAL    PLAN: PT FREQUENCY: 1-2x/week  PT DURATION: 8 weeks  PLANNED INTERVENTIONS:  97164- PT Re-evaluation, 97110-Therapeutic exercises, 97530- Therapeutic activity, [id number]- Neuromuscular re-education, 97535- Self Care, 16109- Manual therapy, [id number]- Gait training, [id number]- Aquatic Therapy, [phone]- Electrical stimulation (manual), [id number]- Vasopneumatic device, [id number]- Traction (mechanical), [id number]- Ionotophoresis 4mg /ml Dexamethasone, Taping, Dry Needling, Joint manipulation, and Spinal manipulation.   Alphonzo Severance PT, DPT 11/15/2023, 1:38 PM

## 2023-11-16 ENCOUNTER — Encounter: Payer: Self-pay | Admitting: Physical Therapy

## 2023-11-16 ENCOUNTER — Ambulatory Visit: Payer: 59 | Attending: Neurosurgery | Admitting: Physical Therapy

## 2023-11-16 DIAGNOSIS — M6281 Muscle weakness (generalized): Secondary | ICD-10-CM | POA: Diagnosis present

## 2023-11-16 DIAGNOSIS — M542 Cervicalgia: Secondary | ICD-10-CM | POA: Insufficient documentation

## 2023-11-16 NOTE — Therapy (Unsigned)
Daily Note   Patient Name: Erin Jimenez MRN: [id number] DOB:22-May-1971, 52 y.o., female Today's Date: 11/17/2023   PT End of Session - 11/16/23 1747     Visit Number 2    Number of Visits --   1-2x/week   Date for PT Re-Evaluation 12/22/23    Authorization Type [employer] - FOTO    PT Start Time 1745    PT Stop Time 1826    PT Time Calculation (min) 41 min             Past Medical History:  Diagnosis Date   Allergy    Anxiety    B12 deficiency    Chiari malformation    Chronic headache    Depression    Endometriosis    Female infertility    GERD (gastroesophageal reflux disease)    controlled with nexium   Insomnia    Insulin resistance    Iron deficiency anemia    Irritable bowel syndrome    constipation   Lower extremity edema    Obesity    PCOS (polycystic ovarian syndrome)    Pseudomeningocele, acquired    Sleep apnea    compliant with Bi-PAP   Swallowing difficulty    had GI work-up; was told that is was a brain-stem issue   Type 2 diabetes mellitus (HCC)    Past Surgical History:  Procedure Laterality Date   ABDOMINAL HYSTERECTOMY  2001   due to endometriosis   BRAIN SURGERY     Chiari malformation   CHOLECYSTECTOMY     COLONOSCOPY     ERCP  2000   ESOPHAGOGASTRODUODENOSCOPY     LAPAROSCOPIC GASTRIC BANDING  2006   SPINAL FUSION     Patient Active Problem List   Diagnosis Date Noted   Treatment-emergent central sleep apnea 09/10/2020   Encounter for counseling on adaptive servo-ventilation (ASV) use 09/10/2020   Iron deficiency anemia 05/09/2020   Perennial allergic rhinitis 05/17/2019   Menopausal symptom 02/14/2019   Occipital neuralgia of right side 09/29/2017   Central sleep apnea syndrome 04/26/2015   Hypoventilation associated with obesity syndrome (HCC) 04/26/2015   OSA on CPAP 02/05/2015   Obesity hypoventilation syndrome (HCC) 12/25/2014   Depression 12/25/2014   Hypersomnia with sleep apnea 12/25/2014   Chronic tension-type  headache, intractable 12/25/2014   Arnold-Chiari malformation, type II (HCC) 12/25/2014   Dysphagia 01/10/2013   IBS (irritable bowel syndrome) 01/10/2013   GERD (gastroesophageal reflux disease) 02/10/2012   Allergic rhinitis due to pollen 02/10/2012   Insomnia 02/10/2012   B12 deficiency 02/10/2012   Pseudomeningocele, acquired 06/11/2011   Chronic constipation 02/10/2008   Endometriosis 02/10/2008    PCP: Jarold Motto, PA  REFERRING PROVIDER: Donalee Citrin, MD  THERAPY DIAG:  Cervicalgia  Muscle weakness  REFERRING DIAG: Radiculopathy, cervical region [M54.12], Cervicalgia [M54.2]   Rationale for Evaluation and Treatment:  Rehabilitation  SUBJECTIVE:  PERTINENT PAST HISTORY:  Chiari decompression with cervical fusion      PRECAUTIONS: Chiari decompression, suboccipital craniectomy, upper cervical laminectomy, duraplasty and a C3 to C5 posterior cervical arthrodesis with lateral mass screws, rods and bone graft (2012);  PER CALL TO MD OFFICE: DO NOT NEEDLE SUB OCCIPITALS OR CX SPINE  WEIGHT BEARING RESTRICTIONS No  FALLS:  Has patient fallen in last 6 months? No, Number of falls: 0  MOI/History of condition:  Onset date: ~ 6 months  SUBJECTIVE STATEMENT  Pt reported relief following TDN for about 2 weeks.  Currently 6/10.    Red flags:  denies   Pain:  Are you having pain? Yes Pain location: neck into UT NPRS scale:  3/10 to 9/10 Aggravating factors: weather, stress, R rotation Relieving factors: take medication Pain description: sharp, dull, and aching Stage: Chronic 24 hour pattern: best first thing in the morning   Occupation: sell commercial trucks, has to climb in or out trucks  Assistive Device: NA  Hand Dominance: R  Patient Goals/Specific Activities: reduce pain   OBJECTIVE:   DIAGNOSTIC FINDINGS:  IMPRESSION: 1. No change since the most recent MRI. Previous posterior fusion from C3 through C5 with lateral mass screws and  posterior rods. Wide laminectomy from the skull base through C4. 2. Chronic prominence of the central canal from C3 to the upper thoracic region. Some of the cord signal is paramedian and there could be some foci of cord gliosis. No evident change since the prior exam. 3. Small pseudomeningocele posterior to the dura at C2 and C3, not enlarged or significant. 4. Mild non-compressive disc bulges at C5-6, C6-7 and C7-T1.  GENERAL OBSERVATION:  Forward head, rounded shoulders     SENSATION:  Light touch: Appears intact   PALPATION: Significant TTP R>L sub occipitals and R>L UT  Cervical ROM  ROM ROM  (Eval)  Flexion 30  Extension 20  Right lateral flexion 28  Left lateral flexion 30  Right rotation 20*  Left rotation 35  Flexion rotation (normal is 30 degrees)   Flexion rotation (normal is 30 degrees)     (Blank rows = not tested, N = WNL, * = concordant pain)  UPPER EXTREMITY MMT:  MMT Right (Eval) Left (Eval)  Shoulder flexion 4+ 4+  Shoulder abduction (C5)    Shoulder ER 4+ 4+  Shoulder IR 4 4  Middle trapezius 4 4  Lower trapezius 3+ 3+  Shoulder extension    Grip strength    Shoulder shrug (C4)    Elbow flexion (C6)    Elbow ext (C7)    Thumb ext (C8)    Finger abd (T1)    Grossly     (Blank rows = not tested, score listed is out of 5 possible points.  N = WNL, D = diminished, C = clear for gross weakness with myotome testing, * = concordant pain with testing)   SPECIAL TESTS:  NT  JOINT MOBILITY TESTING:  Hypomobile - as expected given fusion  PATIENT SURVEYS:  FOTO 51 -> 55    TODAY'S TREATMENT:   [facility] Adult PT Treatment  11/16/2023:  Therapeutic Exercise: UBE 2.5'/2.5' fwd and backward following TDN Open book Chin tuck Corner pec stretch    Manual therapy: Skilled palpation to identify trigger points for TDN Sub occipital release  STM to all listed muscles following TDN  Trigger Point Dry-Needling  Treatment instructions: Expect  mild to moderate muscle soreness. S/S of pneumothorax if dry needled over a lung field, and to seek immediate medical attention should they occur. Patient verbalized understanding of these instructions and education.  Patient Consent Given: Yes Education handout provided: No Muscles treated: Bil UT Electrical stimulation performed: No Parameters: N/A Treatment response/outcome: twitch, pain relief    PATIENT EDUCATION:  POC, diagnosis, prognosis, and outcome measures.  Pt educated via explanation, demonstration.  Pt confirms understanding verbally.    HOME EXERCISE PROGRAM: Provide at V2  Treatment priorities   Eval        TDN/Manual        Gentle strengthening  ASSESSMENT:  CLINICAL IMPRESSION: Erin Jimenez tolerated session well with no adverse reaction.  Pt with significant reported pain reduction following TDN.  Added in some gentle thoracic mobility and gentle DNF strengthening to good effect.  Need to update HEP.  OBJECTIVE IMPAIRMENTS: Pain, cervical ROM, shoulder and periscapular strength  ACTIVITY LIMITATIONS: turning head while driving, sitting, lifting, reading  PERSONAL FACTORS: See medical history and pertinent history   REHAB POTENTIAL: Good  CLINICAL DECISION MAKING: Stable/uncomplicated  EVALUATION COMPLEXITY: Low   GOALS:   SHORT TERM GOALS: Target date: 11/24/2023   Erin Jimenez will be >75% HEP compliant to improve carryover between sessions and facilitate independent management of condition  Evaluation: ongoing Goal status: INITIAL   LONG TERM GOALS: Target date: 12/22/2023   Erin Jimenez will improve FOTO score to 55 as a proxy for functional improvement  Evaluation/Baseline: 51 Goal status: INITIAL    2.  Erin Jimenez will self report >/= 50% decrease in pain from evaluation to improve function in daily tasks  Evaluation/Baseline: 9/10 max pain Goal status: INITIAL   3.  Erin Jimenez will report confidence in self management of  condition at time of discharge with advanced HEP  Evaluation/Baseline: Goal status: INITIAL   4.  Erin Jimenez will improve the following MMTs to >/= 4+/5 to show improvement in strength:     Evaluation/Baseline:   UPPER EXTREMITY MMT:  MMT Right (Eval) Left (Eval)  Shoulder flexion 4+ 4+  Shoulder abduction (C5)    Shoulder ER 4+ 4+  Shoulder IR 4 4  Middle trapezius 4 4  Lower trapezius 3+ 3+  Shoulder extension    Grip strength    Shoulder shrug (C4)    Elbow flexion (C6)    Elbow ext (C7)    Thumb ext (C8)    Finger abd (T1)    Grossly     (Blank rows = not tested, score listed is out of 5 possible points.  N = WNL, D = diminished, C = clear for gross weakness with myotome testing, * = concordant pain with testing)  Goal status: INITIAL    PLAN: PT FREQUENCY: 1-2x/week  PT DURATION: 8 weeks  PLANNED INTERVENTIONS:  97164- PT Re-evaluation, 97110-Therapeutic exercises, 97530- Therapeutic activity, [id number]- Neuromuscular re-education, 97535- Self Care, [id number]- Manual therapy, [id number]- Gait training, [id number]- Aquatic Therapy, [phone]- Electrical stimulation (manual), [id number]- Vasopneumatic device, [id number]- Traction (mechanical), [id number]- Ionotophoresis 4mg /ml Dexamethasone, Taping, Dry Needling, Joint manipulation, and Spinal manipulation.   Alphonzo Severance PT, DPT 11/17/2023, 8:25 AM

## 2023-11-17 ENCOUNTER — Ambulatory Visit: Payer: 59

## 2023-11-17 DIAGNOSIS — M542 Cervicalgia: Secondary | ICD-10-CM | POA: Diagnosis not present

## 2023-11-17 DIAGNOSIS — M6281 Muscle weakness (generalized): Secondary | ICD-10-CM

## 2023-11-17 NOTE — Therapy (Signed)
Daily Note   Patient Name: Erin Jimenez MRN: [id number] DOB:[birth year], 52 y.o., female Today's Date: 11/17/2023   PT End of Session - 11/17/23 1730     Visit Number 3    Date for PT Re-Evaluation 12/22/23    Authorization Type [employer] - FOTO    PT Start Time 1730    PT Stop Time 1810    PT Time Calculation (min) 40 min    Activity Tolerance Patient tolerated treatment well    Behavior During Therapy WFL for tasks assessed/performed             Past Medical History:  Diagnosis Date   Allergy    Anxiety    B12 deficiency    Chiari malformation    Chronic headache    Depression    Endometriosis    Female infertility    GERD (gastroesophageal reflux disease)    controlled with nexium   Insomnia    Insulin resistance    Iron deficiency anemia    Irritable bowel syndrome    constipation   Lower extremity edema    Obesity    PCOS (polycystic ovarian syndrome)    Pseudomeningocele, acquired    Sleep apnea    compliant with Bi-PAP   Swallowing difficulty    had GI work-up; was told that is was a brain-stem issue   Type 2 diabetes mellitus (HCC)    Past Surgical History:  Procedure Laterality Date   ABDOMINAL HYSTERECTOMY  2001   due to endometriosis   BRAIN SURGERY     Chiari malformation   CHOLECYSTECTOMY     COLONOSCOPY     ERCP  2000   ESOPHAGOGASTRODUODENOSCOPY     LAPAROSCOPIC GASTRIC BANDING  2006   SPINAL FUSION     Patient Active Problem List   Diagnosis Date Noted   Treatment-emergent central sleep apnea 09/10/2020   Encounter for counseling on adaptive servo-ventilation (ASV) use 09/10/2020   Iron deficiency anemia 05/09/2020   Perennial allergic rhinitis 05/17/2019   Menopausal symptom 02/14/2019   Occipital neuralgia of right side 09/29/2017   Central sleep apnea syndrome 04/26/2015   Hypoventilation associated with obesity syndrome (HCC) 04/26/2015   OSA on CPAP 02/05/2015   Obesity hypoventilation syndrome (HCC) 12/25/2014   Depression  12/25/2014   Hypersomnia with sleep apnea 12/25/2014   Chronic tension-type headache, intractable 12/25/2014   Arnold-Chiari malformation, type II (HCC) 12/25/2014   Dysphagia 01/10/2013   IBS (irritable bowel syndrome) 01/10/2013   GERD (gastroesophageal reflux disease) 02/10/2012   Allergic rhinitis due to pollen 02/10/2012   Insomnia 02/10/2012   B12 deficiency 02/10/2012   Pseudomeningocele, acquired 06/11/2011   Chronic constipation 02/10/2008   Endometriosis 02/10/2008    PCP: Jarold Motto, PA  REFERRING PROVIDER: Donalee Citrin, MD  THERAPY DIAG:  Cervicalgia  Muscle weakness  REFERRING DIAG: Radiculopathy, cervical region [M54.12], Cervicalgia [M54.2]   Rationale for Evaluation and Treatment:  Rehabilitation  SUBJECTIVE: Rates symptom intensity at 3/10 with discomfort more central at CT junction  PERTINENT PAST HISTORY:  Chiari decompression with cervical fusion      PRECAUTIONS: Chiari decompression, suboccipital craniectomy, upper cervical laminectomy, duraplasty and a C3 to C5 posterior cervical arthrodesis with lateral mass screws, rods and bone graft (2012);  PER CALL TO MD OFFICE: DO NOT NEEDLE SUB OCCIPITALS OR CX SPINE  WEIGHT BEARING RESTRICTIONS No  FALLS:  Has patient fallen in last 6 months? No, Number of falls: 0  MOI/History of condition:  Onset date: ~ 6 months  SUBJECTIVE STATEMENT  Pt reported relief following TDN for about 2 weeks.  Currently 6/10.    Red flags:  denies   Pain:  Are you having pain? Yes Pain location: neck into UT NPRS scale:  3/10 to 9/10 Aggravating factors: weather, stress, R rotation Relieving factors: take medication Pain description: sharp, dull, and aching Stage: Chronic 24 hour pattern: best first thing in the morning   Occupation: sell commercial trucks, has to climb in or out trucks  Assistive Device: NA  Hand Dominance: R  Patient Goals/Specific Activities: reduce pain   OBJECTIVE:    DIAGNOSTIC FINDINGS:  IMPRESSION: 1. No change since the most recent MRI. Previous posterior fusion from C3 through C5 with lateral mass screws and posterior rods. Wide laminectomy from the skull base through C4. 2. Chronic prominence of the central canal from C3 to the upper thoracic region. Some of the cord signal is paramedian and there could be some foci of cord gliosis. No evident change since the prior exam. 3. Small pseudomeningocele posterior to the dura at C2 and C3, not enlarged or significant. 4. Mild non-compressive disc bulges at C5-6, C6-7 and C7-T1.  GENERAL OBSERVATION:  Forward head, rounded shoulders     SENSATION:  Light touch: Appears intact   PALPATION: Significant TTP R>L sub occipitals and R>L UT  Cervical ROM  ROM ROM  (Eval)  Flexion 30  Extension 20  Right lateral flexion 28  Left lateral flexion 30  Right rotation 20*  Left rotation 35  Flexion rotation (normal is 30 degrees)   Flexion rotation (normal is 30 degrees)     (Blank rows = not tested, N = WNL, * = concordant pain)  UPPER EXTREMITY MMT:  MMT Right (Eval) Left (Eval)  Shoulder flexion 4+ 4+  Shoulder abduction (C5)    Shoulder ER 4+ 4+  Shoulder IR 4 4  Middle trapezius 4 4  Lower trapezius 3+ 3+  Shoulder extension    Grip strength    Shoulder shrug (C4)    Elbow flexion (C6)    Elbow ext (C7)    Thumb ext (C8)    Finger abd (T1)    Grossly     (Blank rows = not tested, score listed is out of 5 possible points.  N = WNL, D = diminished, C = clear for gross weakness with myotome testing, * = concordant pain with testing)   SPECIAL TESTS:  NT  JOINT MOBILITY TESTING:  Hypomobile - as expected given fusion  PATIENT SURVEYS:  FOTO 51 -> 55    TODAY'S TREATMENT:  [facility] Adult PT Treatment:                                                DATE: 11/17/23 Therapeutic Exercise: Nustep L4 6 min following manual Open book 1# 10/10 emphasis on breathing Supine  alternating OH flexion 1# 15/15  Manual Therapy: Manual scalene stretch 30s x3 B MWMs into thoracic extension, T1-3 10x each segment B pec minor release 2 min ea.   [facility] Adult PT Treatment  11/16/2023:  Therapeutic Exercise: UBE 2.5'/2.5' fwd and backward following TDN Open book Chin tuck Corner pec stretch    Manual therapy: Skilled palpation to identify trigger points for TDN Sub occipital release  STM to all listed muscles following TDN  Trigger Point Dry-Needling  Treatment instructions: Expect mild  to moderate muscle soreness. S/S of pneumothorax if dry needled over a lung field, and to seek immediate medical attention should they occur. Patient verbalized understanding of these instructions and education.  Patient Consent Given: Yes Education handout provided: No Muscles treated: Bil UT Electrical stimulation performed: No Parameters: N/A Treatment response/outcome: twitch, pain relief    PATIENT EDUCATION:  POC, diagnosis, prognosis, and outcome measures.  Pt educated via explanation, demonstration.  Pt confirms understanding verbally.    HOME EXERCISE PROGRAM: Provide at V2  Treatment priorities   Eval        TDN/Manual        Gentle strengthening                                  ASSESSMENT:  CLINICAL IMPRESSION: Good response to manual techniques, symptom intensity lessened.  Patient declined DN 2 sessions in a row but open to additional sessions.  Concentration on thoracic mobility and scalene stretch today.    OBJECTIVE IMPAIRMENTS: Pain, cervical ROM, shoulder and periscapular strength  ACTIVITY LIMITATIONS: turning head while driving, sitting, lifting, reading  PERSONAL FACTORS: See medical history and pertinent history   REHAB POTENTIAL: Good  CLINICAL DECISION MAKING: Stable/uncomplicated  EVALUATION COMPLEXITY: Low   GOALS:   SHORT TERM GOALS: Target date: 11/24/2023   Kiyra will be >75% HEP compliant to improve carryover between  sessions and facilitate independent management of condition  Evaluation: ongoing Goal status: INITIAL   LONG TERM GOALS: Target date: 12/22/2023   Kaysha will improve FOTO score to 55 as a proxy for functional improvement  Evaluation/Baseline: 51 Goal status: INITIAL    2.  Icelyn will self report >/= 50% decrease in pain from evaluation to improve function in daily tasks  Evaluation/Baseline: 9/10 max pain Goal status: INITIAL   3.  Jamette will report confidence in self management of condition at time of discharge with advanced HEP  Evaluation/Baseline: Goal status: INITIAL   4.  Lilymae will improve the following MMTs to >/= 4+/5 to show improvement in strength:     Evaluation/Baseline:   UPPER EXTREMITY MMT:  MMT Right (Eval) Left (Eval)  Shoulder flexion 4+ 4+  Shoulder abduction (C5)    Shoulder ER 4+ 4+  Shoulder IR 4 4  Middle trapezius 4 4  Lower trapezius 3+ 3+  Shoulder extension    Grip strength    Shoulder shrug (C4)    Elbow flexion (C6)    Elbow ext (C7)    Thumb ext (C8)    Finger abd (T1)    Grossly     (Blank rows = not tested, score listed is out of 5 possible points.  N = WNL, D = diminished, C = clear for gross weakness with myotome testing, * = concordant pain with testing)  Goal status: INITIAL    PLAN: PT FREQUENCY: 1-2x/week  PT DURATION: 8 weeks  PLANNED INTERVENTIONS:  97164- PT Re-evaluation, 97110-Therapeutic exercises, 97530- Therapeutic activity, [id number]- Neuromuscular re-education, 97535- Self Care, [id number]- Manual therapy, [id number]- Gait training, [id number]- Aquatic Therapy, [phone]- Electrical stimulation (manual), [id number]- Vasopneumatic device, [id number]- Traction (mechanical), [id number]- Ionotophoresis 4mg /ml Dexamethasone, Taping, Dry Needling, Joint manipulation, and Spinal manipulation.   Learta Codding PT  11/17/2023, 6:17 PM

## 2023-11-23 ENCOUNTER — Ambulatory Visit: Payer: 59 | Admitting: Physical Therapy

## 2023-11-24 NOTE — Therapy (Unsigned)
Daily Note   Patient Name: Erin Jimenez MRN: [id number] DOB:[birth year], 52 y.o., female Today's Date: 11/25/2023   PT End of Session - 11/25/23 1748     Visit Number 4    Date for PT Re-Evaluation 12/22/23    Authorization Type [employer] - FOTO    PT Start Time 1745    PT Stop Time 1825    PT Time Calculation (min) 40 min    Activity Tolerance Patient tolerated treatment well    Behavior During Therapy WFL for tasks assessed/performed              Past Medical History:  Diagnosis Date   Allergy    Anxiety    B12 deficiency    Chiari malformation    Chronic headache    Depression    Endometriosis    Female infertility    GERD (gastroesophageal reflux disease)    controlled with nexium   Insomnia    Insulin resistance    Iron deficiency anemia    Irritable bowel syndrome    constipation   Lower extremity edema    Obesity    PCOS (polycystic ovarian syndrome)    Pseudomeningocele, acquired    Sleep apnea    compliant with Bi-PAP   Swallowing difficulty    had GI work-up; was told that is was a brain-stem issue   Type 2 diabetes mellitus (HCC)    Past Surgical History:  Procedure Laterality Date   ABDOMINAL HYSTERECTOMY  2001   due to endometriosis   BRAIN SURGERY     Chiari malformation   CHOLECYSTECTOMY     COLONOSCOPY     ERCP  2000   ESOPHAGOGASTRODUODENOSCOPY     LAPAROSCOPIC GASTRIC BANDING  2006   SPINAL FUSION     Patient Active Problem List   Diagnosis Date Noted   Treatment-emergent central sleep apnea 09/10/2020   Encounter for counseling on adaptive servo-ventilation (ASV) use 09/10/2020   Iron deficiency anemia 05/09/2020   Perennial allergic rhinitis 05/17/2019   Menopausal symptom 02/14/2019   Occipital neuralgia of right side 09/29/2017   Central sleep apnea syndrome 04/26/2015   Hypoventilation associated with obesity syndrome (HCC) 04/26/2015   OSA on CPAP 02/05/2015   Obesity hypoventilation syndrome (HCC) 12/25/2014   Depression  12/25/2014   Hypersomnia with sleep apnea 12/25/2014   Chronic tension-type headache, intractable 12/25/2014   Arnold-Chiari malformation, type II (HCC) 12/25/2014   Dysphagia 01/10/2013   IBS (irritable bowel syndrome) 01/10/2013   GERD (gastroesophageal reflux disease) 02/10/2012   Allergic rhinitis due to pollen 02/10/2012   Insomnia 02/10/2012   B12 deficiency 02/10/2012   Pseudomeningocele, acquired 06/11/2011   Chronic constipation 02/10/2008   Endometriosis 02/10/2008    PCP: Jarold Motto, PA  REFERRING PROVIDER: Jarold Motto, PA  THERAPY DIAG:  Cervicalgia  Muscle weakness  REFERRING DIAG: Radiculopathy, cervical region [M54.12], Cervicalgia [M54.2]   Rationale for Evaluation and Treatment:  Rehabilitation  SUBJECTIVE: Doing well until migraine HA set in Monday 12/10  PERTINENT PAST HISTORY:  Chiari decompression with cervical fusion      PRECAUTIONS: Chiari decompression, suboccipital craniectomy, upper cervical laminectomy, duraplasty and a C3 to C5 posterior cervical arthrodesis with lateral mass screws, rods and bone graft (2012);  PER CALL TO MD OFFICE: DO NOT NEEDLE SUB OCCIPITALS OR CX SPINE  WEIGHT BEARING RESTRICTIONS No  FALLS:  Has patient fallen in last 6 months? No, Number of falls: 0  MOI/History of condition:  Onset date: ~ 6 months  SUBJECTIVE  STATEMENT  Pt reported relief following TDN for about 2 weeks.  Currently 6/10.    Red flags:  denies   Pain:  Are you having pain? Yes Pain location: neck into UT NPRS scale:  3/10 to 9/10 Aggravating factors: weather, stress, R rotation Relieving factors: take medication Pain description: sharp, dull, and aching Stage: Chronic 24 hour pattern: best first thing in the morning   Occupation: sell commercial trucks, has to climb in or out trucks  Assistive Device: NA  Hand Dominance: R  Patient Goals/Specific Activities: reduce pain   OBJECTIVE:   DIAGNOSTIC FINDINGS:   IMPRESSION: 1. No change since the most recent MRI. Previous posterior fusion from C3 through C5 with lateral mass screws and posterior rods. Wide laminectomy from the skull base through C4. 2. Chronic prominence of the central canal from C3 to the upper thoracic region. Some of the cord signal is paramedian and there could be some foci of cord gliosis. No evident change since the prior exam. 3. Small pseudomeningocele posterior to the dura at C2 and C3, not enlarged or significant. 4. Mild non-compressive disc bulges at C5-6, C6-7 and C7-T1.  GENERAL OBSERVATION:  Forward head, rounded shoulders     SENSATION:  Light touch: Appears intact   PALPATION: Significant TTP R>L sub occipitals and R>L UT  Cervical ROM  ROM ROM  (Eval)  Flexion 30  Extension 20  Right lateral flexion 28  Left lateral flexion 30  Right rotation 20*  Left rotation 35  Flexion rotation (normal is 30 degrees)   Flexion rotation (normal is 30 degrees)     (Blank rows = not tested, N = WNL, * = concordant pain)  UPPER EXTREMITY MMT:  MMT Right (Eval) Left (Eval)  Shoulder flexion 4+ 4+  Shoulder abduction (C5)    Shoulder ER 4+ 4+  Shoulder IR 4 4  Middle trapezius 4 4  Lower trapezius 3+ 3+  Shoulder extension    Grip strength    Shoulder shrug (C4)    Elbow flexion (C6)    Elbow ext (C7)    Thumb ext (C8)    Finger abd (T1)    Grossly     (Blank rows = not tested, score listed is out of 5 possible points.  N = WNL, D = diminished, C = clear for gross weakness with myotome testing, * = concordant pain with testing)   SPECIAL TESTS:  NT  JOINT MOBILITY TESTING:  Hypomobile - as expected given fusion  PATIENT SURVEYS:  FOTO 51 -> 55    TODAY'S TREATMENT:  [facility] Adult PT Treatment:                                                DATE: 11/25/23 Therapeutic Exercise: Nustep L3 8 min Supine hor abd YTB 15x B, 15/15 unilateral Open book 1000g ball 10/10 emphasis on  breathing Supine alternating OH flexion 1# 15/15 Seated UT stretch 30s x2 B  Manual Therapy: Skilled palpation to identify taught and irritable bands in B UT Trigger Point Dry Needling Treatment: Pre-treatment instruction: Patient instructed on dry needling rationale, procedures, and possible side effects including pain during treatment (achy,cramping feeling), bruising, drop of blood, lightheadedness, nausea, sweating. Patient Consent Given: Yes Education handout provided: Yes Muscles treated: B UT  Needle size and number: .30x68mm x 2 Electrical stimulation performed: No Parameters:  N/A Treatment response/outcome: Twitch response elicited and decreased pain reported Post-treatment instructions: Patient instructed to expect possible mild to moderate muscle soreness later today and/or tomorrow. Patient instructed in methods to reduce muscle soreness and to continue prescribed HEP. If patient was dry needled over the lung field, patient was instructed on signs and symptoms of pneumothorax and, however unlikely, to see immediate medical attention should they occur. Patient was also educated on signs and symptoms of infection and to seek medical attention should they occur. Patient verbalized understanding of these instructions and education.    [facility] Adult PT Treatment:                                                DATE: 11/17/23 Therapeutic Exercise: Nustep L4 6 min following manual Open book 1# 10/10 emphasis on breathing Supine alternating OH flexion 1# 15/15  Manual Therapy: Manual scalene stretch 30s x3 B MWMs into thoracic extension, T1-3 10x each segment B pec minor release 2 min ea.   [facility] Adult PT Treatment  11/16/2023:  Therapeutic Exercise: UBE 2.5'/2.5' fwd and backward following TDN Open book Chin tuck Corner pec stretch    Manual therapy: Skilled palpation to identify trigger points for TDN Sub occipital release  STM to all listed muscles following TDN  Trigger  Point Dry-Needling  Treatment instructions: Expect mild to moderate muscle soreness. S/S of pneumothorax if dry needled over a lung field, and to seek immediate medical attention should they occur. Patient verbalized understanding of these instructions and education.  Patient Consent Given: Yes Education handout provided: No Muscles treated: Bil UT Electrical stimulation performed: No Parameters: N/A Treatment response/outcome: twitch, pain relief    PATIENT EDUCATION:  POC, diagnosis, prognosis, and outcome measures.  Pt educated via explanation, demonstration.  Pt confirms understanding verbally.    HOME EXERCISE PROGRAM: Provide at V2  Treatment priorities   Eval        TDN/Manual        Gentle strengthening                                  ASSESSMENT:  CLINICAL IMPRESSION: Continues to report relief following TPDN.  Today migraine symptoms overshadowed session and resistance and difficult lessened to accommodate.  Focus on stretching and postural correction.  OBJECTIVE IMPAIRMENTS: Pain, cervical ROM, shoulder and periscapular strength  ACTIVITY LIMITATIONS: turning head while driving, sitting, lifting, reading  PERSONAL FACTORS: See medical history and pertinent history   REHAB POTENTIAL: Good  CLINICAL DECISION MAKING: Stable/uncomplicated  EVALUATION COMPLEXITY: Low   GOALS:   SHORT TERM GOALS: Target date: 11/24/2023   Ishitha will be >75% HEP compliant to improve carryover between sessions and facilitate independent management of condition  Evaluation: ongoing Goal status: INITIAL   LONG TERM GOALS: Target date: 12/22/2023   Lynnel will improve FOTO score to 55 as a proxy for functional improvement  Evaluation/Baseline: 51 Goal status: INITIAL    2.  Brande will self report >/= 50% decrease in pain from evaluation to improve function in daily tasks  Evaluation/Baseline: 9/10 max pain Goal status: INITIAL   3.  Hazelgrace will report confidence in  self management of condition at time of discharge with advanced HEP  Evaluation/Baseline: Goal status: INITIAL   4.  Lakeyshia will improve the following  MMTs to >/= 4+/5 to show improvement in strength:     Evaluation/Baseline:   UPPER EXTREMITY MMT:  MMT Right (Eval) Left (Eval)  Shoulder flexion 4+ 4+  Shoulder abduction (C5)    Shoulder ER 4+ 4+  Shoulder IR 4 4  Middle trapezius 4 4  Lower trapezius 3+ 3+  Shoulder extension    Grip strength    Shoulder shrug (C4)    Elbow flexion (C6)    Elbow ext (C7)    Thumb ext (C8)    Finger abd (T1)    Grossly     (Blank rows = not tested, score listed is out of 5 possible points.  N = WNL, D = diminished, C = clear for gross weakness with myotome testing, * = concordant pain with testing)  Goal status: INITIAL    PLAN: PT FREQUENCY: 1-2x/week  PT DURATION: 8 weeks  PLANNED INTERVENTIONS:  97164- PT Re-evaluation, 97110-Therapeutic exercises, 97530- Therapeutic activity, [id number]- Neuromuscular re-education, 97535- Self Care, [id number]- Manual therapy, [id number]- Gait training, [id number]- Aquatic Therapy, [phone]- Electrical stimulation (manual), [id number]- Vasopneumatic device, [id number]- Traction (mechanical), [id number]- Ionotophoresis 4mg /ml Dexamethasone, Taping, Dry Needling, Joint manipulation, and Spinal manipulation.   Learta Codding PT  11/25/2023, 6:30 PM

## 2023-11-25 ENCOUNTER — Ambulatory Visit: Payer: 59

## 2023-11-25 DIAGNOSIS — M6281 Muscle weakness (generalized): Secondary | ICD-10-CM

## 2023-11-25 DIAGNOSIS — M542 Cervicalgia: Secondary | ICD-10-CM

## 2023-11-30 ENCOUNTER — Ambulatory Visit: Payer: 59 | Admitting: Physical Therapy

## 2023-12-02 ENCOUNTER — Ambulatory Visit: Payer: 59

## 2023-12-03 ENCOUNTER — Other Ambulatory Visit: Payer: Self-pay | Admitting: Physician Assistant

## 2023-12-03 DIAGNOSIS — R7303 Prediabetes: Secondary | ICD-10-CM

## 2023-12-30 ENCOUNTER — Other Ambulatory Visit: Payer: Self-pay | Admitting: Neurology

## 2023-12-30 NOTE — Telephone Encounter (Signed)
Last seen on 03/03/23 Follow up scheduled on 03/07/24

## 2024-01-14 ENCOUNTER — Emergency Department (HOSPITAL_COMMUNITY)
Admission: EM | Admit: 2024-01-14 | Discharge: 2024-01-14 | Disposition: A | Discharge status: Home / Self Care | Payer: 59 | Attending: Emergency Medicine | Admitting: Emergency Medicine

## 2024-01-14 ENCOUNTER — Other Ambulatory Visit: Payer: Self-pay

## 2024-01-14 ENCOUNTER — Emergency Department (HOSPITAL_COMMUNITY): Payer: 59

## 2024-01-14 ENCOUNTER — Encounter (HOSPITAL_COMMUNITY): Payer: Self-pay

## 2024-01-14 DIAGNOSIS — Y92410 Unspecified street and highway as the place of occurrence of the external cause: Secondary | ICD-10-CM | POA: Insufficient documentation

## 2024-01-14 DIAGNOSIS — R519 Headache, unspecified: Secondary | ICD-10-CM | POA: Insufficient documentation

## 2024-01-14 MED ORDER — HYDROCODONE-ACETAMINOPHEN 5-325 MG PO TABS
1.0000 | ORAL_TABLET | Freq: Once | ORAL | Status: AC
  Administered 2024-01-14: 1 via ORAL
  Filled 2024-01-14: qty 1

## 2024-01-14 MED ORDER — LIDOCAINE 5 % EX PTCH: 1.0000 | MEDICATED_PATCH | CUTANEOUS | 0 refills | Status: DC
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
## 2024-01-14 NOTE — Discharge Instructions (Addendum)

## 2024-01-31 ENCOUNTER — Other Ambulatory Visit: Payer: Self-pay | Admitting: Neurology

## 2024-01-31 NOTE — Telephone Encounter (Signed)
 Last seen on 03/03/23 Follow up scheduled on 03/07/24

## 2024-02-26 ENCOUNTER — Other Ambulatory Visit: Payer: Self-pay | Admitting: Physician Assistant

## 2024-02-26 DIAGNOSIS — Z8349 Family history of other endocrine, nutritional and metabolic diseases: Secondary | ICD-10-CM

## 2024-03-06 ENCOUNTER — Encounter: Payer: Self-pay | Admitting: *Deleted

## 2024-03-06 NOTE — Progress Notes (Unsigned)
 ESS 7

## 2024-03-07 ENCOUNTER — Telehealth (INDEPENDENT_AMBULATORY_CARE_PROVIDER_SITE_OTHER): Payer: 59 | Admitting: Adult Health

## 2024-03-07 DIAGNOSIS — F5104 Psychophysiologic insomnia: Secondary | ICD-10-CM

## 2024-03-07 DIAGNOSIS — Z9989 Dependence on other enabling machines and devices: Secondary | ICD-10-CM

## 2024-03-07 DIAGNOSIS — F32A Depression, unspecified: Secondary | ICD-10-CM

## 2024-03-07 DIAGNOSIS — G4731 Primary central sleep apnea: Secondary | ICD-10-CM | POA: Diagnosis not present

## 2024-03-07 NOTE — Patient Instructions (Addendum)
 Continue using CPAP nightly and greater than 4 hours each night Continue Pristiq and trazodone-I have attached information about these drugs to your AVS.  Please review and if you notice any potential contraindications let us know If your symptoms worsen or you develop new symptoms please let us know.

## 2024-03-07 NOTE — Progress Notes (Signed)
 PATIENT: Erin Jimenez DOB: 03/28/1971  REASON FOR VISIT: follow up HISTORY FROM: patient PRIMARY NEUROLOGIST: Dr. Vickey Huger  Virtual Visit via Video Note  I connected with Erin Jimenez on 03/07/24 at  2:00 PM EDT by a video enabled telemedicine application located remotely at [facility] and verified that I am speaking with the correct person using two identifiers who was located at their own home.   I discussed the limitations of evaluation and management by telemedicine and the availability of in person appointments. The patient expressed understanding and agreed to proceed.   PATIENT: Erin Jimenez DOB: Mar 15, 1971  REASON FOR VISIT: follow up HISTORY FROM: patient  HISTORY OF PRESENT ILLNESS: Today 03/07/24:  [name] is a 53 y.o. female with a history of obstructive sleep apnea on CPAP, insomnia and depression. Returns today for follow-up.  Overall the patient reports that she has been doing well.  Denies any new issues.  States the CPAP continues to work well for her.  She was placed on Pristiq by Dr. Vickey Huger and reports that it helped tremendously with her depression.  Denies any side effects.  She remains on trazodone at bedtime and reports that it works well to help her sleep.     HISTORY   REVIEW OF SYSTEMS: Out of a complete 14 system review of symptoms, the patient complains only of the following symptoms, and all other reviewed systems are negative.  ALLERGIES: Allergies  Allergen Reactions   Ciprofloxacin Nausea Only   Doxycycline Nausea Only   Other Nausea Only   Oxycodone Nausea Only   Sulfa Antibiotics Nausea Only    HOME MEDICATIONS: Outpatient Medications Prior to Visit  Medication Sig Dispense Refill   cholecalciferol (VITAMIN D) 1000 UNITS tablet Take 4,000 Units by mouth daily.      cyanocobalamin (VITAMIN B12) 1000 MCG/ML injection INJECT 1 ML (1,000 MCG) INTRAMUSCULARLY EVERY 30 DAYS 3 mL 2   cyclobenzaprine (FLEXERIL)  10 MG tablet Take 10 mg by mouth at bedtime.     desvenlafaxine (PRISTIQ) 50 MG 24 hr tablet TAKE 1 TABLET BY MOUTH EVERY DAY 90 tablet 0   dicyclomine (BENTYL) 10 MG capsule Take 1 capsule (10 mg total) by mouth 3 (three) times daily as needed for spasms. 30 capsule 0   esomeprazole (NEXIUM) 40 MG capsule Take 1 capsule (40 mg total) by mouth daily before breakfast. 90 capsule 0   fluticasone (FLONASE) 50 MCG/ACT nasal spray Place 2 sprays into the nose daily as needed. Taking as needed     furosemide (LASIX) 40 MG tablet TAKE 1 TABLET BY MOUTH EVERY DAY 90 tablet 1   HYDROcodone-acetaminophen (NORCO/VICODIN) 5-325 MG tablet Take 1 tablet by mouth every 8 (eight) hours as needed.     lidocaine (LIDODERM) 5 % Place 1 patch onto the skin daily. Remove & Discard patch within 12 hours or as directed by MD 30 patch 0   linaclotide (LINZESS) 145 MCG CAPS capsule Take 1 capsule (145 mcg total) by mouth daily before breakfast. 30 capsule 0   Magnesium 250 MG TABS Take by mouth.     metFORMIN (GLUCOPHAGE) 500 MG tablet TAKE 1 TABLET BY MOUTH TWICE A DAY WITH FOOD 180 tablet 1   Potassium Chloride ER 20 MEQ TBCR Take 1 tablet by mouth daily in the afternoon.     traZODone (DESYREL) 50 MG tablet TAKE 1.5 TABLETS BY MOUTH AT BEDTIME. 135 tablet 0   No facility-administered medications prior to  visit.    PAST MEDICAL HISTORY: Past Medical History:  Diagnosis Date   Allergy    Anxiety    B12 deficiency    Chiari malformation    Chronic headache    Depression    Endometriosis    Female infertility    GERD (gastroesophageal reflux disease)    controlled with nexium   Insomnia    Insulin resistance    Iron deficiency anemia    Irritable bowel syndrome    constipation   Lower extremity edema    Obesity    PCOS (polycystic ovarian syndrome)    Pseudomeningocele, acquired    Sleep apnea    compliant with Bi-PAP   Swallowing difficulty    had GI work-up; was told that is was a brain-stem issue    Type 2 diabetes mellitus (HCC)     PAST SURGICAL HISTORY: Past Surgical History:  Procedure Laterality Date   ABDOMINAL HYSTERECTOMY  2001   due to endometriosis   BRAIN SURGERY     Chiari malformation   CHOLECYSTECTOMY     COLONOSCOPY     ERCP  2000   ESOPHAGOGASTRODUODENOSCOPY     LAPAROSCOPIC GASTRIC BANDING  2006   SPINAL FUSION      FAMILY HISTORY: Family History  Problem Relation Age of Onset   Colon polyps Mother    Diabetes Mother    Brain cancer Mother    Depression Mother    Colon polyps Father    Non-Hodgkin's lymphoma Father    Bladder Cancer Father    Sleep apnea Father    Colon cancer Neg Hx    Rectal cancer Neg Hx    Stomach cancer Neg Hx    Esophageal cancer Neg Hx    Pancreatic cancer Neg Hx     SOCIAL HISTORY: Social History   Socioeconomic History   Marital status: Married    Spouse name: Brenleigh Collet   Number of children: 0   Years of education: Not on file   Highest education level: Not on file  Occupational History   Occupation: Retail buyer: [employer]  Tobacco Use   Smoking status: Never   Smokeless tobacco: Never  Vaping Use   Vaping status: Never Used  Substance and Sexual Activity   Alcohol use: Yes    Comment: occ   Drug use: No   Sexual activity: Yes    Birth control/protection: Surgical  Other Topics Concern   Not on file  Social History Narrative   Married   Nature conservation officer   Fun: likes to travel   No children      Social Drivers of Corporate investment banker Strain: Not on file  Food Insecurity: Not on file  Transportation Needs: Not on file  Physical Activity: Not on file  Stress: Not on file  Social Connections: Not on file  Intimate Partner Violence: Not on file      PHYSICAL EXAM Generalized: Well developed, in no acute distress   Neurological examination  Mentation: Alert oriented to time, place, history taking. Follows all commands speech and  language fluent Cranial nerve II-XII: Facial symmetry noted  DIAGNOSTIC DATA (LABS, IMAGING, TESTING) - I reviewed patient records, labs, notes, testing and imaging myself where available.  Lab Results  Component Value Date   WBC 8.0 04/20/2023   HGB 13.8 04/20/2023   HCT 41.0 04/20/2023   MCV 89.5 04/20/2023   PLT 278.0 04/20/2023  Component Value Date/Time   NA 142 04/20/2023 0914   NA 137 06/03/2020 1515   K 4.0 04/20/2023 0914   CL 100 04/20/2023 0914   CO2 30 04/20/2023 0914   GLUCOSE 136 (H) 04/20/2023 0914   BUN 11 04/20/2023 0914   BUN 13 06/03/2020 1515   CREATININE 0.93 04/20/2023 0914   CREATININE 1.03 07/20/2015 0915   CALCIUM 9.1 05/25/2023 0820   PROT 6.9 09/14/2023 0904   PROT 7.1 06/03/2020 1515   ALBUMIN 3.9 09/14/2023 0904   ALBUMIN 4.4 06/03/2020 1515   AST 30 09/14/2023 0904   ALT 25 09/14/2023 0904   ALKPHOS 126 (H) 09/14/2023 0904   BILITOT 0.7 09/14/2023 0904   BILITOT 0.4 06/03/2020 1515   GFRNONAA 62 06/03/2020 1515   GFRNONAA 65 07/20/2015 0915   GFRAA 71 06/03/2020 1515   GFRAA 75 07/20/2015 0915   Lab Results  Component Value Date   CHOL 194 04/20/2023   HDL 62.40 04/20/2023   LDLCALC 101 (H) 04/20/2023   LDLDIRECT 104.0 05/27/2021   TRIG 155.0 (H) 04/20/2023   CHOLHDL 3 04/20/2023   Lab Results  Component Value Date   HGBA1C 6.9 (H) 05/25/2023   Lab Results  Component Value Date   VITAMINB12 215 04/20/2023   Lab Results  Component Value Date   TSH 1.67 05/27/2021      ASSESSMENT AND PLAN 53 y.o. year old female  has a past medical history of Allergy, Anxiety, B12 deficiency, Chiari malformation, Chronic headache, Depression, Endometriosis, Female infertility, GERD (gastroesophageal reflux disease), Insomnia, Insulin resistance, Iron deficiency anemia, Irritable bowel syndrome, Lower extremity edema, Obesity, PCOS (polycystic ovarian syndrome), Pseudomeningocele, acquired, Sleep apnea, Swallowing difficulty, and Type 2  diabetes mellitus (HCC). here with:  OSA on CPAP  CPAP compliance excellent Residual AHI is good Encouraged patient to continue using CPAP nightly and > 4 hours each night  Depression Continue Pristiq 50 mg daily  Insomnia Continue trazodone 75 mg daily F/U in 1 year or sooner if needed   Butch Penny, MSN, NP-C 03/07/2024, 1:33 PM [facility] [address], [address] [place], [place] [address] [phone]

## 2024-04-04 ENCOUNTER — Other Ambulatory Visit: Payer: Self-pay | Admitting: Adult Health

## 2024-04-23 ENCOUNTER — Other Ambulatory Visit: Payer: Self-pay | Admitting: Physician Assistant

## 2024-04-23 ENCOUNTER — Other Ambulatory Visit: Payer: Self-pay | Admitting: Neurology

## 2024-04-26 ENCOUNTER — Other Ambulatory Visit: Payer: Self-pay | Admitting: Neurology

## 2024-04-26 ENCOUNTER — Encounter: Payer: Self-pay | Admitting: Adult Health

## 2024-04-26 MED ORDER — TRAZODONE HCL 50 MG PO TABS: 50.0000 mg | ORAL_TABLET | Freq: Every day | ORAL | 2 refills | Status: DC

## 2024-04-26 NOTE — Telephone Encounter (Signed)
 Spoke with Dr Albertina Hugger. This was an Warehouse manager. Ok for patient to have 1.5 tablets (50 mg strength) refilled.

## 2024-04-26 NOTE — Telephone Encounter (Signed)
 Could not reach [facility]ist directly. I LVM advising pharmacy that I sent a new prescription in for 75 mg and requested cancellation of the 50 mg.

## 2024-05-30 ENCOUNTER — Other Ambulatory Visit: Payer: Self-pay | Admitting: Physician Assistant

## 2024-06-24 ENCOUNTER — Other Ambulatory Visit: Payer: Self-pay | Admitting: Physician Assistant

## 2024-06-24 DIAGNOSIS — Z8349 Family history of other endocrine, nutritional and metabolic diseases: Secondary | ICD-10-CM

## 2024-06-26 NOTE — Telephone Encounter (Signed)
 Please review refill request

## 2024-07-09 ENCOUNTER — Telehealth: Admitting: Physician Assistant

## 2024-07-09 DIAGNOSIS — B3731 Acute candidiasis of vulva and vagina: Secondary | ICD-10-CM

## 2024-07-09 DIAGNOSIS — R3989 Other symptoms and signs involving the genitourinary system: Secondary | ICD-10-CM | POA: Diagnosis not present

## 2024-07-09 MED ORDER — CEPHALEXIN 500 MG PO CAPS: 500.0000 mg | ORAL_CAPSULE | Freq: Two times a day (BID) | ORAL | 0 refills | Status: AC

## 2024-07-09 MED ORDER — FLUCONAZOLE 150 MG PO TABS
ORAL_TABLET | ORAL | 0 refills | Status: DC
Start: 2024-07-09 — End: 2024-10-24

## 2024-07-09 NOTE — Progress Notes (Signed)
 Virtual Visit Consent   Erin Jimenez, you are scheduled for a virtual visit with a [facility] provider today. Just as with appointments in the office, your consent must be obtained to participate. Your consent will be active for this visit and any virtual visit you may have with one of our providers in the next 365 days. If you have a MyChart account, a copy of this consent can be sent to you electronically.  As this is a virtual visit, video technology does not allow for your provider to perform a traditional examination. This may limit your provider's ability to fully assess your condition. If your provider identifies any concerns that need to be evaluated in person or the need to arrange testing (such as labs, EKG, etc.), we will make arrangements to do so. Although advances in technology are sophisticated, we cannot ensure that it will always work on either your end or our end. If the connection with a video visit is poor, the visit may have to be switched to a telephone visit. With either a video or telephone visit, we are not always able to ensure that we have a secure connection.  By engaging in this virtual visit, you consent to the provision of healthcare and authorize for your insurance to be billed (if applicable) for the services provided during this visit. Depending on your insurance coverage, you may receive a charge related to this service.  I need to obtain your verbal consent now. Are you willing to proceed with your visit today? [name] has provided verbal consent on 07/09/2024 for a virtual visit (video or telephone). Erin Jimenez, [place]  Date: 07/09/2024 12:19 PM   Virtual Visit via Video Note   I, Erin Jimenez, connected with  [name]  ([id number], 02/10/1971) on 07/09/24 at 12:30 PM EDT by a video-enabled telemedicine application and verified that I am speaking with the correct person using two identifiers.  Location: Patient: Virtual Visit Location  Patient: Home Provider: Virtual Visit Location Provider: Home Office   I discussed the limitations of evaluation and management by telemedicine and the availability of in person appointments. The patient expressed understanding and agreed to proceed.    History of Present Illness: Erin Jimenez is a 53 y.o. who identifies as a female who was assigned female at birth, and is being seen today for dysuria and vaginal itching starting about 2-3 days after changing soap.  Notes vaginal itching without discharge. Mild dysuria along with urgency/frequency and hesitancy. Denies back pain, fever or chills. Denies concern for STI.   HPI: HPI  Problems:  Patient Active Problem List   Diagnosis Date Noted   Treatment-emergent central sleep apnea 09/10/2020   Encounter for counseling on adaptive servo-ventilation (ASV) use 09/10/2020   Iron  deficiency anemia 05/09/2020   Perennial allergic rhinitis 05/17/2019   Menopausal symptom 02/14/2019   Occipital neuralgia of right side 09/29/2017   Central sleep apnea syndrome 04/26/2015   Hypoventilation associated with obesity syndrome (HCC) 04/26/2015   OSA on CPAP 02/05/2015   Obesity hypoventilation syndrome (HCC) 12/25/2014   Depression 12/25/2014   Hypersomnia with sleep apnea 12/25/2014   Chronic tension-type headache, intractable 12/25/2014   Arnold-Chiari malformation, type II (HCC) 12/25/2014   Dysphagia 01/10/2013   IBS (irritable bowel syndrome) 01/10/2013   GERD (gastroesophageal reflux disease) 02/10/2012   Allergic rhinitis due to pollen 02/10/2012   Insomnia 02/10/2012   B12 deficiency 02/10/2012   Pseudomeningocele, acquired 06/11/2011   Chronic constipation  02/10/2008   Endometriosis 02/10/2008    Allergies:  Allergies  Allergen Reactions   Ciprofloxacin Nausea Only   Doxycycline Nausea Only   Other Nausea Only   Oxycodone Nausea Only   Sulfa Antibiotics Nausea Only   Medications:  Current Outpatient Medications:     cephALEXin  (KEFLEX ) 500 MG capsule, Take 1 capsule (500 mg total) by mouth 2 (two) times daily for 7 days., Disp: 14 capsule, Rfl: 0   fluconazole  (DIFLUCAN ) 150 MG tablet, Take 1 tablet PO once. Repeat in 3 days if needed., Disp: 2 tablet, Rfl: 0   cholecalciferol (VITAMIN D ) 1000 UNITS tablet, Take 4,000 Units by mouth daily. , Disp: , Rfl:    cyanocobalamin  (VITAMIN B12) 1000 MCG/ML injection, INJECT 1 ML (1,000 MCG) INTRAMUSCULARLY EVERY 30 DAYS, Disp: 3 mL, Rfl: 2   cyclobenzaprine (FLEXERIL) 10 MG tablet, Take 10 mg by mouth at bedtime., Disp: , Rfl:    desvenlafaxine  (PRISTIQ ) 50 MG 24 hr tablet, TAKE 1 TABLET BY MOUTH EVERY DAY, Disp: 90 tablet, Rfl: 3   dicyclomine  (BENTYL ) 10 MG capsule, Take 1 capsule (10 mg total) by mouth 3 (three) times daily as needed for spasms., Disp: 30 capsule, Rfl: 0   esomeprazole  (NEXIUM ) 40 MG capsule, Take 1 capsule (40 mg total) by mouth daily before breakfast., Disp: 90 capsule, Rfl: 0   fluticasone  (FLONASE ) 50 MCG/ACT nasal spray, Place 2 sprays into the nose daily as needed. Taking as needed, Disp: , Rfl:    furosemide  (LASIX ) 40 MG tablet, TAKE 1 TABLET BY MOUTH EVERY DAY, Disp: 90 tablet, Rfl: 0   HYDROcodone -acetaminophen  (NORCO/VICODIN) 5-325 MG tablet, Take 1 tablet by mouth every 8 (eight) hours as needed., Disp: , Rfl:    lidocaine  (LIDODERM ) 5 %, Place 1 patch onto the skin daily. Remove & Discard patch within 12 hours or as directed by MD, Disp: 30 patch, Rfl: 0   linaclotide  (LINZESS ) 145 MCG CAPS capsule, Take 1 capsule (145 mcg total) by mouth daily before breakfast., Disp: 30 capsule, Rfl: 0   Magnesium  250 MG TABS, Take by mouth., Disp: , Rfl:    metFORMIN  (GLUCOPHAGE ) 500 MG tablet, TAKE 1 TABLET BY MOUTH TWICE A DAY WITH FOOD, Disp: 180 tablet, Rfl: 1   Potassium Chloride ER 20 MEQ TBCR, Take 1 tablet by mouth daily in the afternoon., Disp: , Rfl:    traZODone  (DESYREL ) 50 MG tablet, TAKE 1 AND 1/2 TABLETS BY MOUTH AT BEDTIME, Disp: 135  tablet, Rfl: 2   traZODone  (DESYREL ) 50 MG tablet, Take 1 tablet (50 mg total) by mouth at bedtime., Disp: 90 tablet, Rfl: 2  Observations/Objective: Patient is well-developed, well-nourished in no acute distress.  Resting comfortably  at home.  Head is normocephalic, atraumatic.  No labored breathing.  Speech is clear and coherent with logical content.  Patient is alert and oriented at baseline.   Assessment and Plan: 1. Suspected UTI (Primary) - cephALEXin  (KEFLEX ) 500 MG capsule; Take 1 capsule (500 mg total) by mouth 2 (two) times daily for 7 days.  Dispense: 14 capsule; Refill: 0  2. Yeast vaginitis - fluconazole  (DIFLUCAN ) 150 MG tablet; Take 1 tablet PO once. Repeat in 3 days if needed.  Dispense: 2 tablet; Refill: 0  Classic UTI symptoms with absence of alarm signs or symptoms. Prior history of UTI. Will treat empirically with Keflex  for suspected uncomplicated cystitis. Supportive measures and OTC medications reviewed. Diflucan  for suspected concomitant yeast infection. Discussed need to stop use of new soap as she  seems to be more sensitive to it. Strict in-person evaluation precautions discussed.    Follow Up Instructions: I discussed the assessment and treatment plan with the patient. The patient was provided an opportunity to ask questions and all were answered. The patient agreed with the plan and demonstrated an understanding of the instructions.  A copy of instructions were sent to the patient via MyChart unless otherwise noted below.   The patient was advised to call back or seek an in-person evaluation if the symptoms worsen or if the condition fails to improve as anticipated.    Erin Velma Lunger, PA-C

## 2024-07-09 NOTE — Patient Instructions (Addendum)
 Erin Jimenez, thank you for joining Erin Velma Lunger, PA-C for today's virtual visit.  While this provider is not your primary care provider (PCP), if your PCP is located in our provider database this encounter information will be shared with them immediately following your visit.   A [place] MyChart account gives you access to today's visit and all your visits, tests, and labs performed at [facility]  click here if you don't have a [facility] [place] MyChart account or go to [email or web address]  Consent: (Patient) Erin Jimenez provided verbal consent for this virtual visit at the beginning of the encounter.  Current Medications:  Current Outpatient Medications:    cholecalciferol (VITAMIN D ) 1000 UNITS tablet, Take 4,000 Units by mouth daily. , Disp: , Rfl:    cyanocobalamin  (VITAMIN B12) 1000 MCG/ML injection, INJECT 1 ML (1,000 MCG) INTRAMUSCULARLY EVERY 30 DAYS, Disp: 3 mL, Rfl: 2   cyclobenzaprine (FLEXERIL) 10 MG tablet, Take 10 mg by mouth at bedtime., Disp: , Rfl:    desvenlafaxine  (PRISTIQ ) 50 MG 24 hr tablet, TAKE 1 TABLET BY MOUTH EVERY DAY, Disp: 90 tablet, Rfl: 3   dicyclomine  (BENTYL ) 10 MG capsule, Take 1 capsule (10 mg total) by mouth 3 (three) times daily as needed for spasms., Disp: 30 capsule, Rfl: 0   esomeprazole  (NEXIUM ) 40 MG capsule, Take 1 capsule (40 mg total) by mouth daily before breakfast., Disp: 90 capsule, Rfl: 0   fluticasone  (FLONASE ) 50 MCG/ACT nasal spray, Place 2 sprays into the nose daily as needed. Taking as needed, Disp: , Rfl:    furosemide  (LASIX ) 40 MG tablet, TAKE 1 TABLET BY MOUTH EVERY DAY, Disp: 90 tablet, Rfl: 0   HYDROcodone -acetaminophen  (NORCO/VICODIN) 5-325 MG tablet, Take 1 tablet by mouth every 8 (eight) hours as needed., Disp: , Rfl:    lidocaine  (LIDODERM ) 5 %, Place 1 patch onto the skin daily. Remove & Discard patch within 12 hours or as directed by MD, Disp: 30 patch, Rfl: 0   linaclotide  (LINZESS ) 145 MCG CAPS  capsule, Take 1 capsule (145 mcg total) by mouth daily before breakfast., Disp: 30 capsule, Rfl: 0   Magnesium  250 MG TABS, Take by mouth., Disp: , Rfl:    metFORMIN  (GLUCOPHAGE ) 500 MG tablet, TAKE 1 TABLET BY MOUTH TWICE A DAY WITH FOOD, Disp: 180 tablet, Rfl: 1   Potassium Chloride ER 20 MEQ TBCR, Take 1 tablet by mouth daily in the afternoon., Disp: , Rfl:    traZODone  (DESYREL ) 50 MG tablet, TAKE 1 AND 1/2 TABLETS BY MOUTH AT BEDTIME, Disp: 135 tablet, Rfl: 2   traZODone  (DESYREL ) 50 MG tablet, Take 1 tablet (50 mg total) by mouth at bedtime., Disp: 90 tablet, Rfl: 2   Medications ordered in this encounter:  No orders of the defined types were placed in this encounter.    *If you need refills on other medications prior to your next appointment, please contact your pharmacy*  Follow-Up: Call back or seek an in-person evaluation if the symptoms worsen or if the condition fails to improve as anticipated.  [facility] [phone]  Other Instructions Your symptoms are consistent with a bladder infection, also called acute cystitis. Please take your antibiotic (Keflex ) as directed until all pills are gone.  Take your Diflucan  as directed.  Stay very well hydrated.  Consider a daily probiotic (Align, Culturelle, or Activia) to help prevent stomach upset caused by the antibiotic.  Taking a probiotic daily may also help prevent recurrent UTIs.  Also  consider taking AZO (Phenazopyridine) tablets to help decrease pain with urination.  If you note any non-resolving, new, or worsening symptoms despite treatment, please seek an in-person evaluation ASAP.  Urinary Tract Infection A urinary tract infection (UTI) can occur any place along the urinary tract. The tract includes the kidneys, ureters, bladder, and urethra. A type of germ called bacteria often causes a UTI. UTIs are often helped with antibiotic medicine.  HOME CARE  If given, take antibiotics as told by your doctor. Finish  them even if you start to feel better. Drink enough fluids to keep your pee (urine) clear or pale yellow. Avoid tea, drinks with caffeine, and bubbly (carbonated) drinks. Pee often. Avoid holding your pee in for a long time. Pee before and after having sex (intercourse). Wipe from front to back after you poop (bowel movement) if you are a woman. Use each tissue only once. GET HELP RIGHT AWAY IF:  You have back pain. You have lower belly (abdominal) pain. You have chills. You feel sick to your stomach (nauseous). You throw up (vomit). Your burning or discomfort with peeing does not go away. You have a fever. Your symptoms are not better in 3 days. MAKE SURE YOU:  Understand these instructions. Will watch your condition. Will get help right away if you are not doing well or get worse. Document Released: 05/18/2008 Document Revised: 08/24/2012 Document Reviewed: 06/30/2012 [facility] Patient Information 2015 [place], [place]. This information is not intended to replace advice given to you by your health care provider. Make sure you discuss any questions you have with your health care provider.    If you have been instructed to have an in-person evaluation today at a local Urgent Care facility, please use the link below. It will take you to a list of all of our available [place] [facility], including address, phone number and hours of operation. Please do not delay care.  [facility]  If you or a family member do not have a primary care provider, use the link below to schedule a visit and establish care. When you choose a [facility] primary care physician or advanced practice provider, you gain a long-term partner in health. Find a Primary Care Provider  Learn more about [facility]'s in-office and virtual care options: [facility] - Get Care Now

## 2024-08-22 ENCOUNTER — Other Ambulatory Visit: Payer: Self-pay | Admitting: Neurology

## 2024-08-22 ENCOUNTER — Encounter: Payer: Self-pay | Admitting: Adult Health

## 2024-08-22 ENCOUNTER — Other Ambulatory Visit: Payer: Self-pay | Admitting: *Deleted

## 2024-08-22 NOTE — Telephone Encounter (Signed)
 Spoke with pharmacy. I was told the Trazodone  50 mg Rx for 1.5 tablets daily is inactive. They will send another request for refill.

## 2024-08-25 ENCOUNTER — Other Ambulatory Visit: Payer: Self-pay | Admitting: Physician Assistant

## 2024-09-12 ENCOUNTER — Other Ambulatory Visit: Payer: Self-pay | Admitting: Physician Assistant

## 2024-09-12 DIAGNOSIS — R7303 Prediabetes: Secondary | ICD-10-CM

## 2024-09-12 DIAGNOSIS — Z8349 Family history of other endocrine, nutritional and metabolic diseases: Secondary | ICD-10-CM

## 2024-09-12 MED ORDER — METFORMIN HCL 500 MG PO TABS
500.0000 mg | ORAL_TABLET | Freq: Two times a day (BID) | ORAL | 1 refills | Status: AC
Start: 2024-09-12 — End: ?

## 2024-09-12 MED ORDER — CYANOCOBALAMIN 1000 MCG/ML IJ SOLN: 1000.0000 ug | INTRAMUSCULAR | 2 refills | Status: DC

## 2024-09-12 NOTE — Telephone Encounter (Signed)
 Copied from CRM [id number]. Topic: Clinical - Medication Refill >> Sep 12, 2024 10:45 AM Corin V wrote: Medication: metFORMIN  (GLUCOPHAGE ) 500 MG tablet cyanocobalamin  (VITAMIN B12) 1000 MCG/ML injection  Has the patient contacted their pharmacy? Yes (Agent: If no, request that the patient contact the pharmacy for the refill. If patient does not wish to contact the pharmacy document the reason why and proceed with request.) (Agent: If yes, when and what did the pharmacy advise?)  This is the patient's preferred pharmacy:  [id number] - [address] [place] [address] Phone: [phone] Fax: [phone]  Is this the correct pharmacy for this prescription? Yes If no, delete pharmacy and type the correct one.   Has the prescription been filled recently? No  Is the patient out of the medication? Yes- out of B12  Has the patient been seen for an appointment in the last year OR does the patient have an upcoming appointment? Yes  Can we respond through MyChart? Yes  Agent: Please be advised that Rx refills may take up to 3 business days. We ask that you follow-up with your pharmacy.

## 2024-10-24 ENCOUNTER — Encounter: Payer: Self-pay | Admitting: Physician Assistant

## 2024-10-24 ENCOUNTER — Ambulatory Visit: Admitting: Physician Assistant

## 2024-10-24 VITALS — BP 130/74 | HR 77 | Temp 97.9°F | Ht 65.0 in | Wt 297.5 lb

## 2024-10-24 DIAGNOSIS — E1165 Type 2 diabetes mellitus with hyperglycemia: Secondary | ICD-10-CM

## 2024-10-24 DIAGNOSIS — Z0000 Encounter for general adult medical examination without abnormal findings: Secondary | ICD-10-CM

## 2024-10-24 DIAGNOSIS — E782 Mixed hyperlipidemia: Secondary | ICD-10-CM

## 2024-10-24 DIAGNOSIS — Z7984 Long term (current) use of oral hypoglycemic drugs: Secondary | ICD-10-CM

## 2024-10-24 DIAGNOSIS — R1310 Dysphagia, unspecified: Secondary | ICD-10-CM

## 2024-10-24 DIAGNOSIS — E559 Vitamin D deficiency, unspecified: Secondary | ICD-10-CM

## 2024-10-24 DIAGNOSIS — Z23 Encounter for immunization: Secondary | ICD-10-CM | POA: Diagnosis not present

## 2024-10-24 DIAGNOSIS — I872 Venous insufficiency (chronic) (peripheral): Secondary | ICD-10-CM | POA: Diagnosis not present

## 2024-10-24 DIAGNOSIS — R011 Cardiac murmur, unspecified: Secondary | ICD-10-CM

## 2024-10-24 DIAGNOSIS — F330 Major depressive disorder, recurrent, mild: Secondary | ICD-10-CM

## 2024-10-24 DIAGNOSIS — K5909 Other constipation: Secondary | ICD-10-CM | POA: Diagnosis not present

## 2024-10-24 DIAGNOSIS — B3731 Acute candidiasis of vulva and vagina: Secondary | ICD-10-CM

## 2024-10-24 DIAGNOSIS — K7581 Nonalcoholic steatohepatitis (NASH): Secondary | ICD-10-CM | POA: Diagnosis not present

## 2024-10-24 DIAGNOSIS — Z8349 Family history of other endocrine, nutritional and metabolic diseases: Secondary | ICD-10-CM | POA: Diagnosis not present

## 2024-10-24 DIAGNOSIS — Z1231 Encounter for screening mammogram for malignant neoplasm of breast: Secondary | ICD-10-CM

## 2024-10-24 DIAGNOSIS — Z1159 Encounter for screening for other viral diseases: Secondary | ICD-10-CM

## 2024-10-24 LAB — COMPREHENSIVE METABOLIC PANEL WITH GFR
ALT: 17 U/L (ref 0–35)
AST: 24 U/L (ref 0–37)
Albumin: 4.1 g/dL (ref 3.5–5.2)
Alkaline Phosphatase: 149 U/L — ABNORMAL HIGH (ref 39–117)
BUN: 9 mg/dL (ref 6–23)
CO2: 32 meq/L (ref 19–32)
Calcium: 9.2 mg/dL (ref 8.4–10.5)
Chloride: 99 meq/L (ref 96–112)
Creatinine, Ser: 0.95 mg/dL (ref 0.40–1.20)
GFR: 67.39 mL/min (ref >60.00)
Glucose, Bld: 207 mg/dL — ABNORMAL HIGH (ref 70–99)
Potassium: 3.9 meq/L (ref 3.5–5.1)
Sodium: 140 meq/L (ref 135–145)
Total Bilirubin: 0.5 mg/dL (ref 0.2–1.2)
Total Protein: 6.6 g/dL (ref 6.0–8.3)

## 2024-10-24 LAB — CBC WITH DIFFERENTIAL/PLATELET
Basophils Absolute: 0 K/uL (ref 0.0–0.1)
Basophils Relative: 0.7 % (ref 0.0–3.0)
Eosinophils Absolute: 0.2 K/uL (ref 0.0–0.7)
Eosinophils Relative: 2.8 % (ref 0.0–5.0)
HCT: 37.4 % (ref 36.0–46.0)
Hemoglobin: 12.4 g/dL (ref 12.0–15.0)
Lymphocytes Relative: 31.7 % (ref 12.0–46.0)
Lymphs Abs: 2.1 K/uL (ref 0.7–4.0)
MCHC: 33.3 g/dL (ref 30.0–36.0)
MCV: 86.4 fl (ref 78.0–100.0)
Monocytes Absolute: 0.6 K/uL (ref 0.1–1.0)
Monocytes Relative: 8.9 % (ref 3.0–12.0)
Neutro Abs: 3.7 K/uL (ref 1.4–7.7)
Neutrophils Relative %: 55.9 % (ref 43.0–77.0)
Platelets: 184 K/uL (ref 150.0–400.0)
RBC: 4.33 Mil/uL (ref 3.87–5.11)
RDW: 15.7 % — ABNORMAL HIGH (ref 11.5–15.5)
WBC: 6.6 K/uL (ref 4.0–10.5)

## 2024-10-24 LAB — MICROALBUMIN / CREATININE URINE RATIO
Creatinine,U: 75.4 mg/dL
Microalb Creat Ratio: UNDETERMINED mg/g (ref 0.0–30.0)
Microalb, Ur: <0.7 mg/dL

## 2024-10-24 LAB — VITAMIN B12: Vitamin B-12: 604 pg/mL (ref 211–911)

## 2024-10-24 LAB — LIPID PANEL
Cholesterol: 216 mg/dL — ABNORMAL HIGH (ref 0–200)
HDL: 69.8 mg/dL (ref >39.00)
LDL Cholesterol: 113 mg/dL — ABNORMAL HIGH (ref 0–99)
NonHDL: 145.99
Total CHOL/HDL Ratio: 3
Triglycerides: 164 mg/dL — ABNORMAL HIGH (ref 0.0–149.0)
VLDL: 32.8 mg/dL (ref 0.0–40.0)

## 2024-10-24 LAB — VITAMIN D 25 HYDROXY (VIT D DEFICIENCY, FRACTURES): VITD: 15.93 ng/mL — ABNORMAL LOW (ref 30.00–100.00)

## 2024-10-24 LAB — HEMOGLOBIN A1C: Hgb A1c MFr Bld: 9.3 % — ABNORMAL HIGH (ref 4.6–6.5)

## 2024-10-24 MED ORDER — TIRZEPATIDE 2.5 MG/0.5ML ~~LOC~~ SOAJ: 2.5000 mg | SUBCUTANEOUS | 1 refills | Status: DC

## 2024-10-24 MED ORDER — FLUCONAZOLE 150 MG PO TABS: ORAL_TABLET | ORAL | 0 refills | Status: AC

## 2024-10-24 NOTE — Progress Notes (Signed)
 Subjective:    Erin Jimenez is a 53 y.o. female and is here for a comprehensive physical exam.  HPI  Health Maintenance Due  Topic Date Due   Diabetic kidney evaluation - Urine ACR  Never done   Hepatitis C Screening  Never done   Diabetic kidney evaluation - eGFR measurement  04/19/2024   Mammogram  06/21/2024   Discussed the use of AI scribe software for clinical note transcription with the patient, who gave verbal consent to proceed.  History of Present Illness   Erin Jimenez is a 53 year old female who presents for follow-up and medication management.  She is under the care of a neurosurgeon and neurologist for neurological symptoms and takes carbamazepine 200 mg once daily as needed. She has a history of fatty liver with previous liver labs showing multiple elevations, now reduced to one elevated marker. She had liver ultrasound in 2024 that showed   She has severe sleep apnea and uses a BiPAP machine. She is interested in weight loss options. Chronic constipation is managed with Linzess , which she does not take daily, leading to abdominal bloating. 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  She takes furosemide  40 mg daily for swelling. She also has documented venous insufficiency.  She is physically active, working in a large facility, and has lost some weight since earlier this year. Her mental health is managed with Pristiq , which she finds beneficial. She rarely consumes alcohol. There is no significant family history of thyroid cancer or other genetic concerns.     She is diabetic. Currently taking metformin  500 mg twice daily and overall tolerating.  Health Maintenance: Immunizations -- UpToDate  Colonoscopy -- UpToDate  Mammogram -- overdue -- referral placed PAP -- n/a Bone Density -- n/a Diet -- overall healthy Exercise -- walking around regularly  Sleep habits -- no major concerns Mood -- overall  stable  UTD with dentist? - yes UTD with eye doctor? - yes  Weight history: Wt Readings from Last 10 Encounters:  10/24/24 297 lb 8 oz (134.9 kg)  01/14/24 (!) 305 lb (138.3 kg)  09/14/23 (!) 302 lb (137 kg)  04/20/23 300 lb (136.1 kg)  03/03/23 299 lb (135.6 kg)  03/02/22 300 lb (136.1 kg)  06/05/21 (!) 313 lb 7.9 oz (142.2 kg)  05/27/21 (!) 313 lb 6.4 oz (142.2 kg)  02/27/21 (!) 314 lb 5 oz (142.6 kg)  12/18/20 (!) 317 lb 6.1 oz (144 kg)   Body mass index is 49.51 kg/m. No LMP recorded. Patient has had a hysterectomy.  Alcohol use:  reports current alcohol use.  Tobacco use:  Tobacco Use: Low Risk  (10/24/2024)   Patient History    Smoking Tobacco Use: Never    Smokeless Tobacco Use: Never    Passive Exposure: Not on file   Eligible for lung cancer screening? no     04/20/2023    8:22 AM  Depression screen PHQ 2/9  Decreased Interest 0  Down, Depressed, Hopeless 0  PHQ - 2 Score 0     Other providers/specialists: Patient Care Team: Job Lukes, [place] as PCP - General (Physician Assistant) Comer, Lamar ORN, MD (Inactive) as Consulting Physician (Infectious Diseases) Meisinger, Krystal, MD as Consulting Physician (Obstetrics and Gynecology) Alix Lamar, MD (Neurosurgery) Dohmeier, Dedra, MD as Consulting Physician (Neurology)    PMHx, SurgHx, SocialHx, Medications, and Allergies were reviewed in the Visit Navigator and updated as appropriate.  Past Medical History:  Diagnosis Date   Allergy    Anxiety    Arthritis 2012   B12 deficiency    Chiari malformation    Chronic headache    Depression    Endometriosis    Female infertility    GERD (gastroesophageal reflux disease)    controlled with nexium    Insomnia    Insulin resistance    Iron  deficiency anemia    Irritable bowel syndrome    constipation   Lower extremity edema    Obesity    PCOS (polycystic ovarian syndrome)    Pseudomeningocele, acquired    Sleep apnea    compliant with  Bi-PAP   Swallowing difficulty    had GI work-up; was told that is was a brain-stem issue   Type 2 diabetes mellitus (HCC)      Past Surgical History:  Procedure Laterality Date   ABDOMINAL HYSTERECTOMY  2001   due to endometriosis   BRAIN SURGERY     Chiari malformation   CHOLECYSTECTOMY     COLONOSCOPY     ERCP  2000   ESOPHAGOGASTRODUODENOSCOPY     LAPAROSCOPIC GASTRIC BANDING  2006   SPINAL FUSION       Family History  Problem Relation Age of Onset   Colon polyps Mother    Diabetes Mother    Brain cancer Mother    Depression Mother    Arthritis Mother    Cancer Mother    Colon polyps Father    Non-Hodgkin's lymphoma Father    Bladder Cancer Father    Sleep apnea Father    Arthritis Father    Cancer Father    Colon cancer Neg Hx    Rectal cancer Neg Hx    Stomach cancer Neg Hx    Esophageal cancer Neg Hx    Pancreatic cancer Neg Hx     Social History   Tobacco Use   Smoking status: Never   Smokeless tobacco: Never  Vaping Use   Vaping status: Never Used  Substance Use Topics   Alcohol use: Yes    Comment: occ   Drug use: No    Review of Systems:   Review of Systems  Constitutional:  Negative for chills, fever, malaise/fatigue and weight loss.  HENT:  Negative for hearing loss, sinus pain and sore throat.   Respiratory:  Negative for cough and hemoptysis.   Cardiovascular:  Negative for chest pain, palpitations, leg swelling and PND.  Gastrointestinal:  Negative for abdominal pain, constipation, diarrhea, heartburn, nausea and vomiting.  Genitourinary:  Negative for dysuria, frequency and urgency.  Musculoskeletal:  Negative for back pain, myalgias and neck pain.  Skin:  Negative for itching and rash.  Neurological:  Negative for dizziness, tingling, seizures and headaches.  Endo/Heme/Allergies:  Negative for polydipsia.  Psychiatric/Behavioral:  Negative for depression. The patient is not nervous/anxious.     Objective:   BP 130/74 (BP  Location: Left Arm, Patient Position: Sitting, Cuff Size: Large)   Pulse 77   Temp 97.9 F (36.6 C) (Temporal)   Ht 5' 5 (1.651 m)   Wt 297 lb 8 oz (134.9 kg)   SpO2 95%   BMI 49.51 kg/m  Body mass index is 49.51 kg/m.   General Appearance:    Alert, cooperative, no distress, appears stated age  Head:    Normocephalic, without obvious abnormality, atraumatic  Eyes:    PERRL, conjunctiva/corneas clear, EOM's intact, fundi    benign, both eyes  Ears:  Normal TM's and external ear canals, both ears  Nose:   Nares normal, septum midline, mucosa normal, no drainage    or sinus tenderness  Throat:   Lips, mucosa, and tongue normal; teeth and gums normal  Neck:   Supple, symmetrical, trachea midline, no adenopathy;    thyroid:  no enlargement/tenderness/nodules; no carotid   bruit or JVD  Back:     Symmetric, no curvature, ROM normal, no CVA tenderness  Lungs:     Clear to auscultation bilaterally, respirations unlabored  Chest Wall:    No tenderness or deformity   Heart:    Regular rate and rhythm, S1 and S2 normal, no murmur, rub or gallop  Breast Exam:    Deferred   Abdomen:     Soft, non-tender, bowel sounds active all four quadrants,    no masses, no organomegaly  Genitalia:    Deferred   Extremities:   Extremities normal, atraumatic, no cyanosis or edema  Pulses:   2+ and symmetric all extremities  Skin:   Skin color, texture, turgor normal, no rashes or lesions  Lymph nodes:   Cervical, supraclavicular, and axillary nodes normal  Neurologic:   CNII-XII intact, normal strength, sensation and reflexes    throughout    Assessment/Plan:   Assessment and Plan    Comprehensive Physical Exam (CPE) preventive care annual visit Today patient counseled on age appropriate routine health concerns for screening and prevention, each reviewed and up to date or declined. Immunizations reviewed and up to date or declined. Labs ordered and reviewed. Risk factors for depression reviewed  and negative. Hearing function and visual acuity are intact. ADLs screened and addressed as needed. Functional ability and level of safety reviewed and appropriate. Education, counseling and referrals performed based on assessed risks today. Patient provided with a copy of personalized plan for preventive services.  NASH Fatty liver disease with decreasing ALK phos. Has seen gastroenterology. Emphasized gradual weight loss to prevent muscle wasting and skin sagging. Discussed possible metformin  discontinuation if effective. - Ordered blood work for liver function tests. - Continue efforts at weight loss and close follow up with gastrointestinal as recommended   Chronic constipation Potential exacerbation by weight loss medication. Discussed need for daily Linzess  due to GI slowing effect. - Resume daily Linzess  if constipation worsens with weight loss medication.  Venous insufficiency with lower extremity edema Persistent lower extremity edema despite furosemide . Previous ultrasound showed venous insufficiency. Discussed cardiac evaluation due to murmur and family history. - Ordered non-urgent heart ultrasound to evaluate changes.  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 No prior speech therapy. Discussed potential benefit of therapy. - Referred to speech therapy for evaluation and intervention.  Depression Managed with Pristiq . Effective with occasional low days. Recent stressor noted. - Continue Pristiq  as prescribed.  Type 2 diabetes mellitus with hyperglycemia, without long-term current use of insulin (HCC)  Update Hemoglobin A1c add Mounjaro 2.5 mg weekly - risks and benefits/side effect(s) discussed Likely stop metformin   Recurrent vaginal yeast infection Recurrent infection not resolved with Monazet.     Heart murmur Will order echocardiogram given ongoing swelling - likely due to venous insufficiency however due to  murmur, will evaluate with this further   Lucie Buttner, PA-C [facility]

## 2024-10-25 ENCOUNTER — Ambulatory Visit: Payer: Self-pay | Admitting: Physician Assistant

## 2024-10-25 LAB — HEPATITIS C ANTIBODY: Hepatitis C Ab: NONREACTIVE

## 2024-10-25 MED ORDER — VITAMIN D (ERGOCALCIFEROL) 1.25 MG (50000 UNIT) PO CAPS: 50000.0000 [IU] | ORAL_CAPSULE | ORAL | 0 refills | Status: AC

## 2024-10-26 ENCOUNTER — Other Ambulatory Visit: Payer: Self-pay | Admitting: *Deleted

## 2024-10-26 MED ORDER — ROSUVASTATIN CALCIUM 5 MG PO TABS: 5.0000 mg | ORAL_TABLET | Freq: Every day | ORAL | 1 refills | Status: AC

## 2024-11-27 ENCOUNTER — Encounter: Admitting: Physician Assistant

## 2024-11-28 ENCOUNTER — Encounter: Payer: Self-pay | Admitting: Adult Health

## 2024-11-29 ENCOUNTER — Other Ambulatory Visit: Payer: Self-pay | Admitting: Internal Medicine

## 2024-11-29 NOTE — Telephone Encounter (Signed)
 Spoke to patient made sooner appointment .Erin Jimenez Pt thanked me for calling

## 2024-11-29 NOTE — Telephone Encounter (Signed)
 I haven't seen her since March. Please schedule appt.

## 2024-11-29 NOTE — Telephone Encounter (Signed)
 I prefer not to change the medication until I see her in the office.  We can certainly place her on the wait list and see her sooner.  She can also discuss with her PCP

## 2024-11-30 ENCOUNTER — Telehealth: Payer: Self-pay | Admitting: Internal Medicine

## 2024-11-30 MED ORDER — LINACLOTIDE 145 MCG PO CAPS: 145.0000 ug | ORAL_CAPSULE | Freq: Every day | ORAL | 1 refills | Status: AC

## 2024-11-30 NOTE — Telephone Encounter (Signed)
 Spoke to patient gave Megan,NP recommendations. Pt states will call PCP to discuss medications and placed pt on wait list for sooner appointment

## 2024-11-30 NOTE — Telephone Encounter (Signed)
 I left Erin Jimenez a detailed message that her Linzess  has been sent in.

## 2024-11-30 NOTE — Telephone Encounter (Signed)
 Inbound call from patient stating she is needing a refill on her Linzess . Patient is scheduled for February the 17 th. Please advise.

## 2024-12-09 ENCOUNTER — Other Ambulatory Visit: Payer: Self-pay | Admitting: Physician Assistant

## 2024-12-09 DIAGNOSIS — Z8349 Family history of other endocrine, nutritional and metabolic diseases: Secondary | ICD-10-CM

## 2024-12-17 ENCOUNTER — Other Ambulatory Visit: Payer: Self-pay | Admitting: Physician Assistant

## 2025-01-14 ENCOUNTER — Other Ambulatory Visit: Payer: Self-pay | Admitting: Physician Assistant

## 2025-01-14 DIAGNOSIS — Z8349 Family history of other endocrine, nutritional and metabolic diseases: Secondary | ICD-10-CM

## 2025-01-16 ENCOUNTER — Encounter: Payer: Self-pay | Admitting: Adult Health

## 2025-01-17 ENCOUNTER — Ambulatory Visit: Admitting: Adult Health

## 2025-01-30 ENCOUNTER — Ambulatory Visit: Admitting: Internal Medicine

## 2025-01-30 ENCOUNTER — Ambulatory Visit: Admitting: Physician Assistant

## 2025-03-06 ENCOUNTER — Telehealth: Admitting: Adult Health

## 2025-04-06 ENCOUNTER — Ambulatory Visit: Admitting: Adult Health
